# Patient Record
Sex: Male | Born: 1955 | Race: Black or African American | Hispanic: No | Marital: Married | Smoking: Former smoker
Health system: Southern US, Community
[De-identification: ages and names within clinical notes are randomized; demographics above are authoritative.]

## PROBLEM LIST (undated history)

## (undated) DIAGNOSIS — N182 Chronic kidney disease, stage 2 (mild): Secondary | ICD-10-CM

## (undated) DIAGNOSIS — D573 Sickle-cell trait: Secondary | ICD-10-CM

## (undated) DIAGNOSIS — N419 Inflammatory disease of prostate, unspecified: Secondary | ICD-10-CM

## (undated) DIAGNOSIS — E785 Hyperlipidemia, unspecified: Secondary | ICD-10-CM

## (undated) DIAGNOSIS — E119 Type 2 diabetes mellitus without complications: Principal | ICD-10-CM

## (undated) DIAGNOSIS — N400 Enlarged prostate without lower urinary tract symptoms: Secondary | ICD-10-CM

## (undated) DIAGNOSIS — D472 Monoclonal gammopathy: Secondary | ICD-10-CM

## (undated) HISTORY — DX: Chronic kidney disease, stage 2 (mild): N18.2

## (undated) HISTORY — DX: Benign prostatic hyperplasia without lower urinary tract symptoms: N40.0

## (undated) HISTORY — DX: Monoclonal gammopathy: D47.2

## (undated) HISTORY — DX: Hyperlipidemia, unspecified: E78.5

## (undated) HISTORY — DX: Inflammatory disease of prostate, unspecified: N41.9

## (undated) HISTORY — DX: Type 2 diabetes mellitus without complications: E11.9

---

## 2002-07-01 ENCOUNTER — Encounter: Payer: Self-pay | Admitting: Internal Medicine

## 2002-07-01 ENCOUNTER — Encounter: Admission: RE | Admit: 2002-07-01 | Discharge: 2002-07-01 | Payer: Self-pay | Admitting: Internal Medicine

## 2003-10-29 ENCOUNTER — Emergency Department (HOSPITAL_COMMUNITY): Admission: AD | Admit: 2003-10-29 | Discharge: 2003-10-29 | Payer: Self-pay | Admitting: Family Medicine

## 2003-12-10 DIAGNOSIS — E119 Type 2 diabetes mellitus without complications: Secondary | ICD-10-CM

## 2003-12-10 HISTORY — DX: Type 2 diabetes mellitus without complications: E11.9

## 2004-12-11 ENCOUNTER — Ambulatory Visit (HOSPITAL_COMMUNITY): Admission: RE | Admit: 2004-12-11 | Discharge: 2004-12-11 | Payer: Self-pay | Admitting: Gastroenterology

## 2006-11-20 ENCOUNTER — Emergency Department (HOSPITAL_COMMUNITY): Admission: EM | Admit: 2006-11-20 | Discharge: 2006-11-20 | Payer: Self-pay | Admitting: Emergency Medicine

## 2011-01-21 ENCOUNTER — Ambulatory Visit: Payer: Self-pay | Admitting: Family Medicine

## 2011-01-24 ENCOUNTER — Encounter: Payer: Self-pay | Admitting: Family Medicine

## 2011-01-24 ENCOUNTER — Ambulatory Visit (INDEPENDENT_AMBULATORY_CARE_PROVIDER_SITE_OTHER): Payer: BC Managed Care – PPO | Admitting: Family Medicine

## 2011-01-24 VITALS — BP 132/80 | Temp 98.1°F | Ht 72.0 in | Wt 163.0 lb

## 2011-01-24 DIAGNOSIS — N400 Enlarged prostate without lower urinary tract symptoms: Secondary | ICD-10-CM | POA: Insufficient documentation

## 2011-01-24 DIAGNOSIS — E1122 Type 2 diabetes mellitus with diabetic chronic kidney disease: Secondary | ICD-10-CM | POA: Insufficient documentation

## 2011-01-24 DIAGNOSIS — E785 Hyperlipidemia, unspecified: Secondary | ICD-10-CM

## 2011-01-24 DIAGNOSIS — E119 Type 2 diabetes mellitus without complications: Secondary | ICD-10-CM | POA: Insufficient documentation

## 2011-01-24 LAB — POCT GLYCOSYLATED HEMOGLOBIN (HGB A1C): Hemoglobin A1C: 7.9

## 2011-01-24 NOTE — Patient Instructions (Signed)
It was nice meeting you today.  I will let you know the results of your labs when they return and if we need to make any medication adjustments.  As far as insurance you can ask the receptionist up front for information about [facility] and how to get set up in that program.  I would like to see you back in one month once you are hopefully established with her.  At that time we will do a complete physical and check your prostate.  We will also follow up on your blood pressure as well to make sure it isn't creeping up.  In the meantime if you have any questions, please feel free to call our office. Have a nice day!

## 2011-01-25 ENCOUNTER — Telehealth: Payer: Self-pay | Admitting: Family Medicine

## 2011-01-25 DIAGNOSIS — E785 Hyperlipidemia, unspecified: Secondary | ICD-10-CM

## 2011-01-25 LAB — COMPREHENSIVE METABOLIC PANEL
ALT: 9 U/L (ref 0–53)
AST: 13 U/L (ref 0–37)
Albumin: 4.7 g/dL (ref 3.5–5.2)
Alkaline Phosphatase: 81 U/L (ref 39–117)
Potassium: 3.8 mEq/L (ref 3.5–5.3)
Sodium: 139 mEq/L (ref 135–145)
Total Bilirubin: 0.8 mg/dL (ref 0.3–1.2)
Total Protein: 7.8 g/dL (ref 6.0–8.3)

## 2011-01-25 LAB — LIPID PANEL
LDL Cholesterol: 220 mg/dL — ABNORMAL HIGH (ref 0–99)
VLDL: 23 mg/dL (ref 0–40)

## 2011-01-25 MED ORDER — SIMVASTATIN 40 MG PO TABS: 40.0000 mg | ORAL_TABLET | Freq: Every evening | ORAL | Status: DC

## 2011-01-25 NOTE — Telephone Encounter (Signed)
Called to give patient results of recent labs, patient unavailable left message with wife for him to call back when he gets an opportunity.

## 2011-01-25 NOTE — Telephone Encounter (Signed)
Patient returned call.  Lab results explained.  Counseled on lifestyle changes, will have him fill out a food record when he returns in one month.  For now will start simvastatin.

## 2011-01-28 ENCOUNTER — Encounter: Payer: Self-pay | Admitting: Family Medicine

## 2011-01-29 DIAGNOSIS — Z0000 Encounter for general adult medical examination without abnormal findings: Secondary | ICD-10-CM | POA: Insufficient documentation

## 2011-01-29 NOTE — Assessment & Plan Note (Signed)
Was on meds before, will obtain lipid panel if elevated enough to need meds,  will start zocor since generic and fairly inexpensive.

## 2011-01-29 NOTE — Progress Notes (Signed)
  Subjective:    Patient ID: Richard Gill, male    DOB: Nov 12, 1956, 55 y.o.   MRN: [id number]  HPI Pt. Is new patient here for annual physical and to discuss follow-up of diabetes, lipids and bph  1.  Diabetes-  Has had diabetes for ~7 years.  Currently on glimeperide and metformin.  Tolerating these medications well.  Does not check blood sugars at home as he has not been able to afford strips.  Currently does not have insurance and has been umemployed for the past few months.  Interested in getting set up with [facility] program.  When he was checking sugar he states that his sugars were in the one teens range.  Denies episodes of low blood sugar.    2.  Lipids Was on meds before, thinks it was lipitor, unsure of dosage.  States he was taken off because cholesterol improved.  Had been tried on other meds before, some made his muscles achy.  Denies any recent changes in diet or activity  3. BPH Was on tamsulosin, has been out for a few months.  States even though he has been out his stream is normal and he has not had any further symptoms of prostate problems.  Is still taking saw palmetto supplement.  Denies nocturia or hesitancy at this point.     Review of Systems Pertinent positives and negatives noted in HPI, Vitals signs noted     Objective:   Physical Exam  Constitutional: He is oriented to person, place, and time. He appears well-developed and well-nourished. No distress.  HENT:  Head: Normocephalic and atraumatic.  Right Ear: External ear normal.  Left Ear: External ear normal.  Nose: Nose normal.  Mouth/Throat: Oropharynx is clear and moist.  Eyes: Conjunctivae and EOM are normal. Pupils are equal, round, and reactive to light. No scleral icterus.  Neck: Neck supple. No tracheal deviation present. No thyromegaly present.  Cardiovascular: Normal rate and regular rhythm.  Exam reveals no gallop and no friction rub.   No murmur heard. Pulmonary/Chest: Effort normal and breath  sounds normal.  Abdominal: Soft. He exhibits no distension and no mass. There is no tenderness.  Musculoskeletal: He exhibits no edema.  Lymphadenopathy:    He has no cervical adenopathy.  Neurological: He is alert and oriented to person, place, and time. He has normal reflexes.  Skin: Skin is warm and dry.  Psychiatric: He has a normal mood and affect. His behavior is normal.

## 2011-01-29 NOTE — Assessment & Plan Note (Addendum)
Overall seems to be doing well, will get only basic labs today as to minimize cost since currently without insurance.

## 2011-01-29 NOTE — Assessment & Plan Note (Signed)
BPH seems improved, no longer needing medication.  Plans to cont. On saw palmetto.  Will see back in one month and do PSA once he is set up with [facility]

## 2011-01-29 NOTE — Assessment & Plan Note (Addendum)
Hard to assess control as pt. Has not been keeping track of sugars.  Will get hgb A1c to assess glycemic control.

## 2011-07-14 ENCOUNTER — Other Ambulatory Visit: Payer: Self-pay | Admitting: Family Medicine

## 2011-07-14 MED ORDER — GLIMEPIRIDE 4 MG PO TABS: 4.0000 mg | ORAL_TABLET | Freq: Every day | ORAL | Status: DC

## 2011-08-24 ENCOUNTER — Emergency Department (HOSPITAL_COMMUNITY)
Admission: EM | Admit: 2011-08-24 | Discharge: 2011-08-25 | Disposition: A | Discharge status: Home / Self Care | Payer: BC Managed Care – PPO | Attending: Emergency Medicine | Admitting: Emergency Medicine

## 2011-08-24 DIAGNOSIS — S5010XA Contusion of unspecified forearm, initial encounter: Secondary | ICD-10-CM | POA: Insufficient documentation

## 2011-08-24 DIAGNOSIS — W540XXA Bitten by dog, initial encounter: Secondary | ICD-10-CM | POA: Insufficient documentation

## 2011-08-24 DIAGNOSIS — Z79899 Other long term (current) drug therapy: Secondary | ICD-10-CM | POA: Insufficient documentation

## 2011-08-24 DIAGNOSIS — E119 Type 2 diabetes mellitus without complications: Secondary | ICD-10-CM | POA: Insufficient documentation

## 2011-09-06 ENCOUNTER — Ambulatory Visit (INDEPENDENT_AMBULATORY_CARE_PROVIDER_SITE_OTHER): Payer: BC Managed Care – PPO | Admitting: Family Medicine

## 2011-09-06 VITALS — BP 127/63 | HR 65 | Temp 98.5°F | Ht 69.6 in | Wt 151.0 lb

## 2011-09-06 DIAGNOSIS — E785 Hyperlipidemia, unspecified: Secondary | ICD-10-CM

## 2011-09-06 DIAGNOSIS — N400 Enlarged prostate without lower urinary tract symptoms: Secondary | ICD-10-CM

## 2011-09-06 DIAGNOSIS — W540XXA Bitten by dog, initial encounter: Secondary | ICD-10-CM

## 2011-09-06 DIAGNOSIS — S51809A Unspecified open wound of unspecified forearm, initial encounter: Secondary | ICD-10-CM

## 2011-09-06 DIAGNOSIS — E119 Type 2 diabetes mellitus without complications: Secondary | ICD-10-CM

## 2011-09-06 DIAGNOSIS — T148XXA Other injury of unspecified body region, initial encounter: Secondary | ICD-10-CM

## 2011-09-06 DIAGNOSIS — S51859A Open bite of unspecified forearm, initial encounter: Secondary | ICD-10-CM

## 2011-09-06 LAB — LIPID PANEL
HDL: 37 mg/dL — ABNORMAL LOW (ref >39)
LDL Cholesterol: 124 mg/dL — ABNORMAL HIGH (ref 0–99)

## 2011-09-06 LAB — POCT GLYCOSYLATED HEMOGLOBIN (HGB A1C): Hemoglobin A1C: 10.2

## 2011-09-06 LAB — C-PEPTIDE: C-Peptide: 0.74 ng/mL — ABNORMAL LOW (ref 0.80–3.90)

## 2011-09-06 LAB — INSULIN, RANDOM: Insulin: 4 u[IU]/mL (ref 3–28)

## 2011-09-06 MED ORDER — LISINOPRIL 2.5 MG PO TABS: 2.5000 mg | ORAL_TABLET | Freq: Every day | ORAL | Status: DC

## 2011-09-06 MED ORDER — TAMSULOSIN HCL 0.4 MG PO CAPS: 0.4000 mg | ORAL_CAPSULE | Freq: Every day | ORAL | Status: DC

## 2011-09-06 NOTE — Patient Instructions (Addendum)
It was good seeing you today.  I am concerned with your weight loss and your A1c increase that you are not making enough insulin.  I am going to check some labs to see how much insulin you are producing.  If your insulin is low we need to start supplementing you with insulin.  I am also checking your cholesterol and your PSA today.  I will let you know the results of these when they return.  I have started you on a medicine called lisinopril to protect your kidney.  You will need to come back in one week for a lab to make sure your kidney function is ok on the medicine.

## 2011-09-11 ENCOUNTER — Telehealth: Payer: Self-pay | Admitting: Family Medicine

## 2011-09-11 NOTE — Telephone Encounter (Signed)
Called patient to discuss lab results.  Explained to him that PSA was above 7 which is the cutoff for biopsy.  [facility] seen by [facility] in [facility] past for [facility]PH.  Will refer back to there for him to discuss prostate biopsy.  Also talked to him about diabetes, recent labs showed that insulin levels and c-peptide were low.  Asked him to make an appointment to discuss starting on insulin.

## 2011-09-11 NOTE — Telephone Encounter (Signed)
Forward to Dr Zigmund Daniel, patient with abnormal lab results

## 2011-09-11 NOTE — Telephone Encounter (Signed)
Mr. Makosky is calling to speak with Dr. Zigmund Daniel about the lab results and some medication.

## 2011-09-12 ENCOUNTER — Ambulatory Visit (INDEPENDENT_AMBULATORY_CARE_PROVIDER_SITE_OTHER): Payer: BC Managed Care – PPO | Admitting: Family Medicine

## 2011-09-12 ENCOUNTER — Encounter: Payer: Self-pay | Admitting: Family Medicine

## 2011-09-12 DIAGNOSIS — N400 Enlarged prostate without lower urinary tract symptoms: Secondary | ICD-10-CM

## 2011-09-12 DIAGNOSIS — E119 Type 2 diabetes mellitus without complications: Secondary | ICD-10-CM

## 2011-09-12 NOTE — Telephone Encounter (Signed)
Pt. In for visit today, will address today.

## 2011-09-13 ENCOUNTER — Other Ambulatory Visit: Payer: BC Managed Care – PPO

## 2011-09-13 DIAGNOSIS — E119 Type 2 diabetes mellitus without complications: Secondary | ICD-10-CM

## 2011-09-13 NOTE — Progress Notes (Signed)
Drew BMP per Dr. West Pugh last phone note to check kidney function. Unk Lightning, MLS

## 2011-09-14 LAB — BASIC METABOLIC PANEL WITH GFR
GFR, Est African American: >60 mL/min (ref >60)
GFR, Est Non African American: >60 mL/min (ref >60)
Potassium: 4.2 mEq/L (ref 3.5–5.3)
Sodium: 137 mEq/L (ref 135–145)

## 2011-09-15 DIAGNOSIS — W540XXA Bitten by dog, initial encounter: Secondary | ICD-10-CM | POA: Insufficient documentation

## 2011-09-15 DIAGNOSIS — S51859A Open bite of unspecified forearm, initial encounter: Secondary | ICD-10-CM | POA: Insufficient documentation

## 2011-09-15 NOTE — Assessment & Plan Note (Signed)
Healing well, given red flags.  TDAP updated.

## 2011-09-15 NOTE — Assessment & Plan Note (Signed)
Still having issues with prostate.  Concerning given undesired weight loss.   Declined DRE today.  Will check PSA today, if elevated will refer to urology to see if he needs repeat biopsy.  Will get him back on flomax for now for his urinary retention problem.

## 2011-09-15 NOTE — Assessment & Plan Note (Addendum)
A1C significantly elevated from previously.  I am concerned that he may be insulin deficient given his weight loss despite poor diet and his poor glycemic control.  I will check an insulin level and C-peptide today to assess the amount of insulin he is actually making.  If these are low he will need to be started on insulin.  Also currently not on ACE-I, will start low dose and recheck BMET in 1 week.

## 2011-09-15 NOTE — Progress Notes (Signed)
  Subjective:    Patient ID: Richard Gill., male    DOB: May 14, 1956, 55 y.o.   MRN: [id number]  HPI 1. Diabetes:  Here to follow up on his diabetes.  Feels like his sugars have been running high, did not bring meter today because he recently got a new meter and does not know how to use it, so he plans to return it for a different similar to the one he had previously.  He is taking his medications as prescribed.  He does get out and walk occasionally.  Regarding diet he does eat quite a bit of sweets and breads.   Despite his increased intake and poor dietary choices he reports a 7lb weight loss without trying.   2. Prostate issues:  Reports that a few days ago he felt like he was unable to make urine.  He felt like he had to go but was unable to.  He has had issues with his prostate in the past and has been followed by [facility].  Reports continued hesitancy but he is able to urinate now.  He does have a history of elevated PSA but he is unsure what his last one was because it has been 3-4 years since he has followed up because he has not had insurance.  Did have biopsy and was told he had BPH at that time.   Has been taking saw palmetto, but stopped because he could not tell much of a difference. Has tried flomax in the past which has worked well for him.  3.  Recent dog bite:  Recently had dog bite to R forearm after trying to break up his dogs from fighting.  Was seen in ED and area was flushed and cleaned.  He was given Augmentin.  Denies pain, redness, drainage from the area.  Given tetanus booster at that time as well.    Review of Systems Per HPI    Objective:   Physical Exam  Constitutional: He appears well-developed and well-nourished. No distress.  Neck: Neck supple. No thyromegaly present.  Cardiovascular: Normal rate, regular rhythm and normal heart sounds.   Pulmonary/Chest: Effort normal and breath sounds normal.  Genitourinary:       Patient did not want DRE today.     Musculoskeletal:       Well healed puncture wound on proximal r forearm.  Non erythematous, non-tender.          Assessment & Plan:

## 2011-09-15 NOTE — Assessment & Plan Note (Signed)
PSA elevated to threshold for biopsy.  I am unsure if this is stable from his previous PSA.  He may need to have repeat biopsy.  Will refer to [facility] for further evaluation.

## 2011-09-15 NOTE — Progress Notes (Signed)
  Subjective:    Patient ID: Richard Gill., male    DOB: [birth year], 55 y.o.   MRN: [id number]  HPIHere to discuss recent labs: 1.  DM:  Was recently in for follow up of his diabetes and reported that he has been losing weight despite increased sweets in his diet and compliance with his medications.  Concerning at that time for insulin deficiency.  We checked his insulin and c-peptide levels which were both low.  The sugars he has been getting on his meter the past couple of days are 260-435.  He typically checks fasting in the am and when he gets home from work.   2.  Prostate:  History of elevated PSA with normal biopsy in the past.  Has not followed up with [facility] in 3-4 years.  PSA elevated on recent check.  Urinary retention symptoms improved since starting back on flomax.    Review of Systems     Objective:   Physical Exam  Constitutional: He appears well-developed and well-nourished. No distress.          Assessment & Plan:

## 2011-09-15 NOTE — Assessment & Plan Note (Signed)
Explained to him that given results of recent lab work that he needs to be started on insulin.  Patient nervous about starting on insulin as he is afraid of needles.  Seen with pharmacy resident during this visit and we explained to him use of insulin pen and size of insulin needles.  He did feel a bit more comfortable after that and said that he would help his daughter help with this initially.  I will have him f/u with pharmacy clinic to get discuss insulin and get insulin use teaching.  Scheduled for 10/8.

## 2011-09-16 ENCOUNTER — Telehealth: Payer: Self-pay | Admitting: Family Medicine

## 2011-09-16 ENCOUNTER — Encounter: Payer: Self-pay | Admitting: Pharmacist

## 2011-09-16 ENCOUNTER — Ambulatory Visit (INDEPENDENT_AMBULATORY_CARE_PROVIDER_SITE_OTHER): Payer: BC Managed Care – PPO | Admitting: Pharmacist

## 2011-09-16 VITALS — BP 111/68 | HR 105 | Ht 72.5 in | Wt 152.7 lb

## 2011-09-16 DIAGNOSIS — E785 Hyperlipidemia, unspecified: Secondary | ICD-10-CM

## 2011-09-16 DIAGNOSIS — E119 Type 2 diabetes mellitus without complications: Secondary | ICD-10-CM

## 2011-09-16 MED ORDER — INSULIN GLARGINE 100 UNIT/ML ~~LOC~~ SOLN: 10.0000 [IU] | Freq: Every day | SUBCUTANEOUS | Status: DC

## 2011-09-16 MED ORDER — INSULIN PEN NEEDLE 31G X 5 MM MISC: 1.0000 | Freq: Every day | Status: DC

## 2011-09-16 NOTE — Assessment & Plan Note (Signed)
Pt's LDL-C remains above goal (<100) at 120 on simvastatin 40mg .  Pt is working on reducing his carbohydrate intake however, and we will check his cholesterol panel again in 3 months.

## 2011-09-16 NOTE — Telephone Encounter (Signed)
Called by wife b/c pharmacy said they didn't receive Lantus order.  In EPIC, it states Lantus was sent in.  Called Pharmacy to clarify, pharmacist agreed that he had not received the order. Gave exact same prescription today that was prescribed in clinic over the phone.

## 2011-09-16 NOTE — Progress Notes (Signed)
  Subjective:    Patient ID: Richard Augusta., male    DOB: 01-Jun-1956, 55 y.o.   MRN: [id number]  HPI  Reviewed and agree with Dr. Graylin Shiver management.   Review of Systems     Objective:   Physical Exam        Assessment & Plan:

## 2011-09-16 NOTE — Progress Notes (Signed)
  Subjective:    Patient ID: Richard Augusta., male    DOB: 02-Jun-1956, 55 y.o.   MRN: [id number]  HPI 71 yoM presenting for insulin initiation and counseling.   Pt has been taking metformin 1g BID, and his most recent A1C is 10.2. Pt denies taking amaryl since 01/2011.  , and pt states his blood sugar has been consistently 300-500s.  He was seen by Dr. Zigmund Daniel last week who decided insulin therapy is now appropriate.       Review of Systems     Objective:   Physical Exam Recent insulin level of 4 and c-peptide level of 0.79.  LDL has decreased from 220--> 124 with initation of simvastatin 40mg .        Assessment & Plan:   Diabetes of unknown duration currently under poor control of blood glucose based on  . Lab Results  Component Value Date   HGBA1C 10.2 09/06/2011   , and home CBG readings of 300-500s. Denies hypoglycemic events.  Able to verbalize appropriate hypoglycemia management plan. Started basal insulin Lantus (insulin glargine) 10 units every morning. Patient will continue to titrate 1 unit/day if fasting CBGs > 100mg /dl until fasting CBGs reach goal or next visit. Pt also stated he  is trying to reduce his carbohydrate intake, especially with  Sugar beverages and krispy kreme donuts.  Pt will not restart amaryl at this time.   Pt's LDL-C remains above goal (<100) at 120 on simvastatin 40mg .  Pt is working on reducing his carbohydrate intake however, and we will check his cholesterol panel again in 3 months.   Written patient instructions provided.  Follow up in  Pharmacist Clinic Visit 1 month.   TTFFC 30 minutes.  Patient seen with .Volanda Napoleon, PharmD Candidate and Ralene Bathe, Pharmacy Resident .

## 2011-09-16 NOTE — Patient Instructions (Signed)
Start Lantus insulin injection 10 units tomorrow morning. Go up by 1 unit each day until your sugar is around 100 and keep taking that dose. Follow up in 1 month with Rx Clinic.

## 2011-09-16 NOTE — Assessment & Plan Note (Signed)
Diabetes of unknown duration currently under poor control of blood glucose based on  . Lab Results  Component Value Date   HGBA1C 10.2 09/06/2011   , and home CBG readings of 300-500s. Denies hypoglycemic events.  Able to verbalize appropriate hypoglycemia management plan. Started basal insulin Lantus (insulin glargine) 10 units every morning. Patient will continue to titrate 1 unit/day if fasting CBGs > 100mg /dl until fasting CBGs reach goal or next visit. Pt also stated he  is trying to reduce his carbohydrate intake, especially with  Sugar beverages and krispy kreme donuts.  Pt will not restart amaryl at this time.   Written patient instructions provided.  Follow up in  Pharmacist Clinic Visit 1 month.   TTFFC 30 minutes.  Patient seen with .Volanda Napoleon, PharmD Candidate and Ralene Bathe, Pharmacy Resident .

## 2011-09-27 ENCOUNTER — Telehealth: Payer: Self-pay | Admitting: Family Medicine

## 2011-09-27 NOTE — Telephone Encounter (Signed)
Pt is calling to say he is having problems with his vision and thinks it is coming from the new medicine he was placed on.  Wants to talk to Dr Rodena Piety about this today if possible.

## 2011-09-27 NOTE — Telephone Encounter (Signed)
Patient started on Lantus 10/8.  Prior to insulin he was taking Metformin with BS's running on the 300 - 500 range.  Instructions for insulin were to administer 10 unit daily and increase by 1 unit until BS was at 100.   Last night's BS was 67 and 72 this am with an am dose of 16 units.  He is now having blurred vision.  Consulted with Dr. Jess Barters who recommended that he drop back to 10 units tomorrow and over the weekend.  Should continue to monitor BSs over the w/e.  Instructed wife what to do in the event of a hyglycemic episode as well as how to reach the on-call resident over the weekend.  Told her I would call her back on Monday to see how BSs were doing.  Wife agreable.

## 2011-09-30 ENCOUNTER — Ambulatory Visit: Payer: BC Managed Care – PPO | Admitting: Family Medicine

## 2011-09-30 NOTE — Telephone Encounter (Signed)
Spike with patient just now and he did continue with the 10 units of Lantus over the weekend.  Did not have his meter with him but reported that his BS's were in the 100 to 120 range.  Told him I would consult with Dr. Zigmund Daniel or Dr. Valentina Lucks to determine if he should stay on the 10 units or increase it.  I will call him back.

## 2011-09-30 NOTE — Telephone Encounter (Signed)
Spoke with patient regarding his insulin. Has been using 10 units of Lantus over the weekend. Reports sugars fasting in the 120s. He had one episode in the morning where sugar was in the 180s. Sugar was dropping into the 60s and 70s on 16 units last week, he was having some blurred vision with this. He denies any shakiness or sweats. Explained him that we would increase his Lantus slowly again. Advised him that if his blood was over 115 fasting  add an additional unit of Lantus in the morning. He will call back is still dropping too low.

## 2012-02-07 ENCOUNTER — Other Ambulatory Visit: Payer: Self-pay | Admitting: Family Medicine

## 2012-02-07 NOTE — Telephone Encounter (Signed)
Refill request

## 2012-04-08 DIAGNOSIS — D472 Monoclonal gammopathy: Secondary | ICD-10-CM

## 2012-04-08 HISTORY — DX: Monoclonal gammopathy: D47.2

## 2012-05-06 ENCOUNTER — Other Ambulatory Visit: Payer: Self-pay | Admitting: *Deleted

## 2012-05-06 DIAGNOSIS — N400 Enlarged prostate without lower urinary tract symptoms: Secondary | ICD-10-CM

## 2012-05-06 MED ORDER — TAMSULOSIN HCL 0.4 MG PO CAPS: 0.4000 mg | ORAL_CAPSULE | Freq: Every day | ORAL | Status: DC

## 2012-05-07 ENCOUNTER — Other Ambulatory Visit: Payer: Self-pay | Admitting: *Deleted

## 2012-05-07 DIAGNOSIS — E119 Type 2 diabetes mellitus without complications: Secondary | ICD-10-CM

## 2012-05-07 MED ORDER — LISINOPRIL 2.5 MG PO TABS: 2.5000 mg | ORAL_TABLET | Freq: Every day | ORAL | Status: DC

## 2012-09-18 ENCOUNTER — Other Ambulatory Visit: Payer: Self-pay | Admitting: Family Medicine

## 2012-10-12 ENCOUNTER — Emergency Department (HOSPITAL_COMMUNITY)
Admission: EM | Admit: 2012-10-12 | Discharge: 2012-10-12 | Disposition: A | Discharge status: Home / Self Care | Payer: BC Managed Care – PPO | Attending: Emergency Medicine | Admitting: Emergency Medicine

## 2012-10-12 ENCOUNTER — Encounter (HOSPITAL_COMMUNITY): Payer: Self-pay

## 2012-10-12 DIAGNOSIS — Z7982 Long term (current) use of aspirin: Secondary | ICD-10-CM | POA: Insufficient documentation

## 2012-10-12 DIAGNOSIS — R110 Nausea: Secondary | ICD-10-CM | POA: Insufficient documentation

## 2012-10-12 DIAGNOSIS — N400 Enlarged prostate without lower urinary tract symptoms: Secondary | ICD-10-CM | POA: Insufficient documentation

## 2012-10-12 DIAGNOSIS — N10 Acute tubulo-interstitial nephritis: Secondary | ICD-10-CM | POA: Insufficient documentation

## 2012-10-12 DIAGNOSIS — E785 Hyperlipidemia, unspecified: Secondary | ICD-10-CM | POA: Insufficient documentation

## 2012-10-12 DIAGNOSIS — R509 Fever, unspecified: Secondary | ICD-10-CM | POA: Insufficient documentation

## 2012-10-12 DIAGNOSIS — E119 Type 2 diabetes mellitus without complications: Secondary | ICD-10-CM | POA: Insufficient documentation

## 2012-10-12 DIAGNOSIS — R300 Dysuria: Secondary | ICD-10-CM | POA: Insufficient documentation

## 2012-10-12 DIAGNOSIS — Z794 Long term (current) use of insulin: Secondary | ICD-10-CM | POA: Insufficient documentation

## 2012-10-12 DIAGNOSIS — Z79899 Other long term (current) drug therapy: Secondary | ICD-10-CM | POA: Insufficient documentation

## 2012-10-12 DIAGNOSIS — Z87891 Personal history of nicotine dependence: Secondary | ICD-10-CM | POA: Insufficient documentation

## 2012-10-12 LAB — URINALYSIS, ROUTINE W REFLEX MICROSCOPIC
Bilirubin Urine: NEGATIVE
Ketones, ur: 15 mg/dL — AB
Nitrite: NEGATIVE
Protein, ur: 30 mg/dL — AB
Urobilinogen, UA: 0.2 mg/dL (ref 0.0–1.0)

## 2012-10-12 LAB — CBC WITH DIFFERENTIAL/PLATELET
Basophils Relative: 0 % (ref 0–1)
Eosinophils Absolute: 0.1 10*3/uL (ref 0.0–0.7)
Eosinophils Relative: 1 % (ref 0–5)
Hemoglobin: 11.7 g/dL — ABNORMAL LOW (ref 13.0–17.0)
MCH: 30.6 pg (ref 26.0–34.0)
MCHC: 35.5 g/dL (ref 30.0–36.0)
Monocytes Relative: 9 % (ref 3–12)
Neutrophils Relative %: 82 % — ABNORMAL HIGH (ref 43–77)

## 2012-10-12 LAB — BASIC METABOLIC PANEL
BUN: 15 mg/dL (ref 6–23)
Calcium: 9.1 mg/dL (ref 8.4–10.5)
Creatinine, Ser: 1.43 mg/dL — ABNORMAL HIGH (ref 0.50–1.35)
GFR calc Af Amer: 61 mL/min — ABNORMAL LOW (ref >90)
GFR calc non Af Amer: 53 mL/min — ABNORMAL LOW (ref >90)
Potassium: 3.9 mEq/L (ref 3.5–5.1)

## 2012-10-12 MED ORDER — SULFAMETHOXAZOLE-TRIMETHOPRIM 800-160 MG PO TABS: 1.0000 | ORAL_TABLET | Freq: Two times a day (BID) | ORAL | Status: DC

## 2012-10-12 MED ORDER — SULFAMETHOXAZOLE-TMP DS 800-160 MG PO TABS
1.0000 | ORAL_TABLET | Freq: Once | ORAL | Status: AC
  Administered 2012-10-12: 1 via ORAL
  Filled 2012-10-12: qty 1

## 2012-10-12 MED ORDER — SODIUM CHLORIDE 0.9 % IV BOLUS (SEPSIS)
1000.0000 mL | Freq: Once | INTRAVENOUS | Status: AC
  Administered 2012-10-12: 1000 mL via INTRAVENOUS

## 2012-10-12 MED ORDER — OXYCODONE-ACETAMINOPHEN 5-325 MG PO TABS: ORAL_TABLET | ORAL | Status: DC

## 2012-10-12 MED ORDER — ONDANSETRON HCL 4 MG/2ML IJ SOLN
4.0000 mg | Freq: Once | INTRAMUSCULAR | Status: AC
  Administered 2012-10-12: 4 mg via INTRAVENOUS

## 2012-10-12 MED ORDER — ONDANSETRON HCL 4 MG/2ML IJ SOLN
4.0000 mg | Freq: Once | INTRAMUSCULAR | Status: AC
  Administered 2012-10-12: 4 mg via INTRAVENOUS
  Filled 2012-10-12: qty 2

## 2012-10-12 MED ORDER — ONDANSETRON HCL 4 MG/2ML IJ SOLN
INTRAMUSCULAR | Status: AC
  Filled 2012-10-12: qty 2

## 2012-10-12 MED ORDER — ONDANSETRON HCL 4 MG PO TABS: 4.0000 mg | ORAL_TABLET | Freq: Three times a day (TID) | ORAL | Status: DC | PRN

## 2012-10-12 MED ORDER — MORPHINE SULFATE 4 MG/ML IJ SOLN
4.0000 mg | Freq: Once | INTRAMUSCULAR | Status: AC
  Administered 2012-10-12: 4 mg via INTRAVENOUS
  Filled 2012-10-12: qty 1

## 2012-10-12 NOTE — ED Notes (Signed)
Nicole, PA at bedside 

## 2012-10-12 NOTE — ED Notes (Addendum)
Pt A.O. X 4. Complains of left upper quadrant x 4 days.  Tender on palpation. Non radiating. Worse with activity.  Reports nausea. Denies vomiting. Denies constipation and diarrhea. Denies recent change in diet or medication. Reports hxt of DM.  Insulin dependant. Respirations even and regular. Vitals stable. Ambulatory. NAD. Family at bedside.

## 2012-10-12 NOTE — ED Provider Notes (Signed)
History     CSN: [id number]  Arrival date & time 10/12/12  [id number]   None     Chief Complaint  Patient presents with  . Abdominal Pain    (Consider location/radiation/quality/duration/timing/severity/associated sxs/prior treatment) Patient is a 56 y.o. male presenting with abdominal pain.  Abdominal Pain The primary symptoms of the illness include abdominal pain, fever, nausea and dysuria. The primary symptoms of the illness do not include shortness of breath, vomiting or diarrhea.  The dysuria is not associated with hematuria.  Additional symptoms associated with the illness include chills. Symptoms associated with the illness do not include hematuria.   Richard Gill. is a 56 y.o. male with PMH significant for IDDM, hyperlipemia and BPH complaining of dysuria, nausea, fever (Tmax 102.6) and chills, and 6/10 Left flank/LUQ pain.  Symptoms evolved in the order listed above over 4 days. Pt denies change in bowel or bladder habits, emesis, testicular pain/swelling, rash, urethral discharge, chest pain, SOB.    Past Medical History  Diagnosis Date  . T2DM (type 2 diabetes mellitus) 2005  . Hyperlipidemia   . BPH (benign prostatic hyperplasia)     Past Surgical History  Procedure Date  . Colonoscopy 2006    Dr. Benson Norway, was told to f/u in 5 years    Family History  Problem Relation Age of Onset  . Heart attack Father 70  . Colon cancer Sister 24  . Colon cancer Sister 67  . Breast cancer Mother 51    History  Substance Use Topics  . Smoking status: Former Smoker    Quit date: 01/28/1991  . Smokeless tobacco: Not on file  . Alcohol Use: No      Review of Systems  Constitutional: Positive for fever and chills.  Respiratory: Negative for shortness of breath.   Cardiovascular: Negative for chest pain.  Gastrointestinal: Positive for nausea and abdominal pain. Negative for vomiting and diarrhea.  Genitourinary: Positive for dysuria and flank pain. Negative for hematuria,  discharge, scrotal swelling and testicular pain.  All other systems reviewed and are negative.    Allergies  Review of patient's allergies indicates no known allergies.  Home Medications   Current Outpatient Rx  Name  Route  Sig  Dispense  Refill  . ASPIRIN 325 MG PO TABS   Oral   Take 325 mg by mouth daily.           . INSULIN GLARGINE 100 UNIT/ML [place] SOLN   Subcutaneous   Inject 10-40 Units into the skin at bedtime. Inject 10 units today and increase dose by 1 unit each day until morning sugar is around 100. Once your blood sugar is at 100 continue taking that dose.   10 mL   12   . INSULIN PEN NEEDLE 31G X 5 MM MISC   Subcutaneous   Inject 1 Device into the skin daily. Quantity sufficient for 1 injection a day   1 each   11   . LISINOPRIL 2.5 MG PO TABS      TAKE ONE TABLET BY MOUTH EVERY DAY. NEEDS APPOINTMENT FOR ADDITIONAL REFILLS   15 tablet   0   . METFORMIN HCL 1000 MG PO TABS   Oral   Take 1,000 mg by mouth 2 (two) times daily with meals.           Marland Kitchen SIMVASTATIN 40 MG PO TABS      TAKE ONE TABLET BY MOUTH EVERY DAY IN THE EVENING   30 tablet  6   . TAMSULOSIN HCL 0.4 MG PO CAPS   Oral   Take 1 capsule (0.4 mg total) by mouth daily.   30 capsule   6     BP 129/82  Pulse 88  Temp 98.6 F (37 C) (Oral)  Resp 18  SpO2 99%  Physical Exam  Nursing note and vitals reviewed. Constitutional: He is oriented to person, place, and time. He appears well-developed and well-nourished. No distress.  HENT:  Head: Normocephalic.  Mouth/Throat: Oropharynx is clear and moist.  Eyes: Conjunctivae normal and EOM are normal.  Cardiovascular: Normal rate, regular rhythm and normal heart sounds.   Pulmonary/Chest: Effort normal. No stridor.  Abdominal: Soft. Bowel sounds are normal. He exhibits no distension and no mass. There is no tenderness. There is no rebound and no guarding.  Genitourinary:       Mild to moderate left CVA tenderness  Musculoskeletal:  Normal range of motion.  Neurological: He is alert and oriented to person, place, and time.  Skin: Skin is warm.  Psychiatric: He has a normal mood and affect.    ED Course  Procedures (including critical care time)  Labs Reviewed  CBC WITH DIFFERENTIAL - Abnormal; Notable for the following:    RBC 3.82 (*)     Hemoglobin 11.7 (*)     HCT 33.0 (*)     Platelets 144 (*)     Neutrophils Relative 82 (*)     Neutro Abs 7.8 (*)     Lymphocytes Relative 9 (*)     All other components within normal limits  BASIC METABOLIC PANEL - Abnormal; Notable for the following:    Glucose, Bld 206 (*)     Creatinine, Ser 1.43 (*)     GFR calc non Af Amer 53 (*)     GFR calc Af Amer 61 (*)     All other components within normal limits  URINALYSIS, ROUTINE W REFLEX MICROSCOPIC - Abnormal; Notable for the following:    APPearance CLOUDY (*)     Glucose, UA 250 (*)     Hgb urine dipstick LARGE (*)     Ketones, ur 15 (*)     Protein, ur 30 (*)     Leukocytes, UA MODERATE (*)     All other components within normal limits  URINE MICROSCOPIC-ADD ON - Abnormal; Notable for the following:    Squamous Epithelial / LPF FEW (*)     Bacteria, UA FEW (*)     All other components within normal limits  URINE CULTURE   No results found.   1. Pyelonephritis, acute       MDM  Patient with dysuria, fever, nausea and left CVA tenderness. Likely pyelo.   Urinalysis shows few bacteria with moderate leukocytes X. esterase and large hemoglobin. I will culture the urine he will receive the first dose of Bactrim and I will write him twice a day X7. Antibiotic choice discussed with attending Dr. Prince Rome. Urine culture ordered.   Slight bump in creatinine to 1.43  Discussed results and plan with patient and wife who agree with care plan. Return precautions given.  New Prescriptions   ONDANSETRON (ZOFRAN) 4 MG TABLET    Take 1 tablet (4 mg total) by mouth every 8 (eight) hours as needed for nausea.    OXYCODONE-ACETAMINOPHEN (PERCOCET/ROXICET) 5-325 MG PER TABLET    1 to 2 tabs PO q6hrs  PRN for pain   SULFAMETHOXAZOLE-TRIMETHOPRIM (SEPTRA DS) 800-160 MG PER TABLET  Take 1 tablet by mouth every 12 (twelve) hours.          Monico Blitz, PA-C 10/12/12 [id number]

## 2012-10-13 NOTE — ED Provider Notes (Signed)
Medical screening examination/treatment/procedure(s) were performed by non-physician practitioner and as supervising physician I was immediately available for consultation/collaboration.  Perlie Mayo, MD 10/13/12 [phone]

## 2012-10-14 LAB — URINE CULTURE

## 2012-10-15 NOTE — ED Notes (Addendum)
+   Urine Patient treated with Bactrim-sensitive to same-chart appended per protocol MD.

## 2012-10-16 ENCOUNTER — Encounter: Payer: Self-pay | Admitting: Family Medicine

## 2012-10-16 ENCOUNTER — Ambulatory Visit (INDEPENDENT_AMBULATORY_CARE_PROVIDER_SITE_OTHER): Payer: BC Managed Care – PPO | Admitting: Family Medicine

## 2012-10-16 VITALS — BP 132/72 | HR 69 | Ht 70.5 in | Wt 153.0 lb

## 2012-10-16 DIAGNOSIS — R799 Abnormal finding of blood chemistry, unspecified: Secondary | ICD-10-CM

## 2012-10-16 DIAGNOSIS — R7989 Other specified abnormal findings of blood chemistry: Secondary | ICD-10-CM

## 2012-10-16 DIAGNOSIS — N3090 Cystitis, unspecified without hematuria: Secondary | ICD-10-CM

## 2012-10-16 NOTE — Progress Notes (Signed)
Richard Gill. is a 56 y.o. who presents today for hospital f/u for cystitis.  Pt recently seen in the ED on 10/12/12 for increased urinary frequency, urgency, fever, and CVA pain.  He was dx with pyelonephritis and was started on Bactrim bid for 7 days.    Today, pt is not c/o fever, chills, suprapubic pain, increased urgency or frequency, dysuria, hematuria.  On day 5/7 of ABx.   Past Medical History  Diagnosis Date  . T2DM (type 2 diabetes mellitus) 2005  . Hyperlipidemia   . BPH (benign prostatic hyperplasia)     Family History  Problem Relation Age of Onset  . Heart attack Father 7  . Colon cancer Sister 70  . Colon cancer Sister 15  . Breast cancer Mother 25    Current Outpatient Prescriptions on File Prior to Visit  Medication Sig Dispense Refill  . aspirin 325 MG tablet Take 325 mg by mouth daily.      Marland Kitchen CINNAMON PO Take 1 tablet by mouth daily.      . insulin glargine (LANTUS SOLOSTAR) 100 UNIT/ML injection Inject 10 Units into the skin every morning.      Marland Kitchen lisinopril (PRINIVIL,ZESTRIL) 2.5 MG tablet Take 2.5 mg by mouth daily.      . metFORMIN (GLUCOPHAGE) 1000 MG tablet Take 1,000 mg by mouth 2 (two) times daily with meals.        . ondansetron (ZOFRAN) 4 MG tablet Take 1 tablet (4 mg total) by mouth every 8 (eight) hours as needed for nausea.  10 tablet  0  . oxyCODONE-acetaminophen (PERCOCET/ROXICET) 5-325 MG per tablet 1 to 2 tabs PO q6hrs  PRN for pain  15 tablet  0  . simvastatin (ZOCOR) 40 MG tablet Take 40 mg by mouth every evening.      . sulfamethoxazole-trimethoprim (SEPTRA DS) 800-160 MG per tablet Take 1 tablet by mouth every 12 (twelve) hours.  14 tablet  0  . Tamsulosin HCl (FLOMAX) 0.4 MG CAPS Take 0.4 mg by mouth daily.      . Thiamine HCl (VITAMIN B-1 PO) Take 1 tablet by mouth daily.         Physical Exam Filed Vitals:   10/16/12 1510  BP: 132/72  Pulse: 69    Gen: NAD Neck: no JVD Cardio: RRR Lungs: CTA Abd: NABS, soft nontender non  distended, no suprapubic tenderness, no CVA tenderness.       Chemistry      Component Value Date/Time   NA 136 10/12/2012 0708   K 3.9 10/12/2012 0708   CL 102 10/12/2012 0708   CO2 23 10/12/2012 0708   BUN 15 10/12/2012 0708   CREATININE 1.43* 10/12/2012 0708   CREATININE 1.13 09/13/2011 0907      Component Value Date/Time   CALCIUM 9.1 10/12/2012 0708   ALKPHOS 81 01/24/2011 1658   AST 13 01/24/2011 1658   ALT 9 01/24/2011 1658   BILITOT 0.8 01/24/2011 1658      Lab Results  Component Value Date   WBC 9.5 10/12/2012   HGB 11.7* 10/12/2012   HCT 33.0* 10/12/2012   MCV 86.4 10/12/2012   PLT 144* 10/12/2012

## 2012-10-16 NOTE — Patient Instructions (Signed)
Richard Gill, it was nice to see you in clinic today.  I'm glad you are doing well. Continue to take the Bactrim as directed for a total of 7 days.  Please see Dr. Zigmund Daniel in the next two weeks for a well check and medication refill.  Thanks, Dr. Awanda Mink

## 2012-10-16 NOTE — Assessment & Plan Note (Signed)
Elevated Creatinine to 1.43 in hospital ED on 10/12/12.  Would consider repeating at well check with Dr. Zigmund Daniel in two weeks.

## 2012-10-16 NOTE — Assessment & Plan Note (Signed)
On day 5/7 of Bactrim for cystitis.  Considered to have pyelonephritis in the ED.  However, no documented fever in the past two weeks and WBC in ED was 9.5.  Pt is all right with taking 7 days for the infection.  Will see Dr. Zigmund Daniel in next two weeks for well check.

## 2012-10-20 ENCOUNTER — Telehealth: Payer: Self-pay | Admitting: Family Medicine

## 2012-10-20 MED ORDER — SULFAMETHOXAZOLE-TRIMETHOPRIM 800-160 MG PO TABS: 1.0000 | ORAL_TABLET | Freq: Two times a day (BID) | ORAL | Status: DC

## 2012-10-20 NOTE — Telephone Encounter (Signed)
In regards to previous office visit.

## 2012-10-20 NOTE — Telephone Encounter (Addendum)
partient came in and saw Dr. Awanda Mink on 11/08. Wife states he still has chills. Temp last night was 99.  Having pain lower back and in rectal area.  Finished antibiotic yesterday. Will forward to Dr. Awanda Mink.

## 2012-10-20 NOTE — Telephone Encounter (Signed)
Spoke with patient's wife regarding patient's Sx.  Concern for pyelonephritis originally in the ED.  However, did not have WBC or fever at anytime during his course.  Wife feels he is not improving and continuing to have chills.  Will go ahead and call in one more week of Bactrim to complete the Tx course for pyelo.  If no improvement by Friday, recommended to call back for an appointment that day.  Also, for the pain recommended tylenol 650 mg tid.  No Ibuprofen due to his elevation in [place].    Tamela Oddi Awanda Mink, DO of [facility] 10/20/2012, 3:31 PM

## 2012-10-20 NOTE — Telephone Encounter (Signed)
Wife is calling about taking her husband, she took him to the ER last week for pain in his abdomen and chills.  He was diagnosed with pyelonephritis, then he came in to [facility] and saw a different doctor.  Patient isn't feeling any better, all of the meds he was prescribed are gone and now he is having pain in his rectum.  She is particularly concerned because he is a diabetic.

## 2012-10-21 ENCOUNTER — Other Ambulatory Visit: Payer: Self-pay | Admitting: Family Medicine

## 2012-10-30 ENCOUNTER — Ambulatory Visit (INDEPENDENT_AMBULATORY_CARE_PROVIDER_SITE_OTHER): Payer: BC Managed Care – PPO | Admitting: Family Medicine

## 2012-10-30 ENCOUNTER — Encounter: Payer: Self-pay | Admitting: Family Medicine

## 2012-10-30 VITALS — BP 152/87 | HR 67 | Temp 97.9°F | Ht 70.5 in | Wt 155.0 lb

## 2012-10-30 DIAGNOSIS — E785 Hyperlipidemia, unspecified: Secondary | ICD-10-CM

## 2012-10-30 DIAGNOSIS — I10 Essential (primary) hypertension: Secondary | ICD-10-CM

## 2012-10-30 DIAGNOSIS — E119 Type 2 diabetes mellitus without complications: Secondary | ICD-10-CM

## 2012-10-30 LAB — LIPID PANEL
LDL Cholesterol: 118 mg/dL — ABNORMAL HIGH (ref 0–99)
Total CHOL/HDL Ratio: 6.5 Ratio
VLDL: 24 mg/dL (ref 0–40)

## 2012-10-30 MED ORDER — INSULIN GLARGINE 100 UNIT/ML ~~LOC~~ SOLN: 12.0000 [IU] | Freq: Every morning | SUBCUTANEOUS | Status: DC

## 2012-10-30 MED ORDER — TAMSULOSIN HCL 0.4 MG PO CAPS: 0.4000 mg | ORAL_CAPSULE | Freq: Every day | ORAL | Status: DC

## 2012-10-30 MED ORDER — SIMVASTATIN 40 MG PO TABS: 40.0000 mg | ORAL_TABLET | Freq: Every evening | ORAL | Status: DC

## 2012-10-30 MED ORDER — METFORMIN HCL 1000 MG PO TABS: 1000.0000 mg | ORAL_TABLET | Freq: Two times a day (BID) | ORAL | Status: DC

## 2012-10-30 MED ORDER — LISINOPRIL 2.5 MG PO TABS: 2.5000 mg | ORAL_TABLET | Freq: Every day | ORAL | Status: DC

## 2012-10-30 NOTE — Patient Instructions (Addendum)
Thank you for coming in today, it was good to see you Your A1c looks pretty good today.  Continue lantus at 12 units Return in three months or as needed.

## 2012-11-03 DIAGNOSIS — I10 Essential (primary) hypertension: Secondary | ICD-10-CM | POA: Insufficient documentation

## 2012-11-03 NOTE — Assessment & Plan Note (Signed)
BP elevated today.  Restart lisinopril

## 2012-11-03 NOTE — Progress Notes (Signed)
  Subjective:    Patient ID: Richard Augusta., Richard Gill    DOB: 06-Jul-1956, 56 y.o.   MRN: [id number]  HPI 1. DM:  CHRONIC DIABETES  Disease Monitoring  Blood Sugar Ranges: 100-150's fasting  Polyuria: no   Visual problems: no   Medication Compliance: yes.  Using 12 units of lantus and metformin.  Medication Side Effects  Hypoglycemia: no   Preventitive Health Care  Eye Exam: Needed  Foot Exam: Done Today  Diet pattern: Generally healthy, tries to limit carb intake.  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   Does not do a whole lot in addition.    2. HTN: BP elevated today.  Has been out of lisinopril for a couple of weeks.  Does not monitor BP at home.  He denies chest pain, palpitations, vision changes, weakness, headache.   Review of Systems Per HPI    Objective:   Physical Exam  Constitutional: He appears well-nourished. No distress.  HENT:  Head: Normocephalic and atraumatic.  Cardiovascular: Normal rate and regular rhythm.   Musculoskeletal: He exhibits no edema.  Neurological: He is alert.          Assessment & Plan:

## 2012-11-03 NOTE — Assessment & Plan Note (Signed)
A1c improved from previous, continue current medications.  Continue to work on diet and exercise.  Return in 3 months.   Foot exam done today.

## 2012-12-09 HISTORY — PX: COLONOSCOPY: SHX174

## 2013-01-22 ENCOUNTER — Ambulatory Visit: Payer: BC Managed Care – PPO | Admitting: Family Medicine

## 2013-02-04 ENCOUNTER — Encounter: Payer: Self-pay | Admitting: Family Medicine

## 2013-02-04 ENCOUNTER — Ambulatory Visit (INDEPENDENT_AMBULATORY_CARE_PROVIDER_SITE_OTHER): Payer: BC Managed Care – PPO | Admitting: Family Medicine

## 2013-02-04 ENCOUNTER — Other Ambulatory Visit: Payer: Self-pay | Admitting: Family Medicine

## 2013-02-04 VITALS — BP 115/63 | HR 94 | Ht 70.5 in | Wt 157.0 lb

## 2013-02-04 DIAGNOSIS — R52 Pain, unspecified: Secondary | ICD-10-CM

## 2013-02-04 DIAGNOSIS — R61 Generalized hyperhidrosis: Secondary | ICD-10-CM

## 2013-02-04 DIAGNOSIS — R509 Fever, unspecified: Secondary | ICD-10-CM

## 2013-02-04 DIAGNOSIS — E119 Type 2 diabetes mellitus without complications: Secondary | ICD-10-CM

## 2013-02-04 DIAGNOSIS — M2550 Pain in unspecified joint: Secondary | ICD-10-CM

## 2013-02-04 LAB — CBC WITH DIFFERENTIAL/PLATELET
Basophils Relative: 0 % (ref 0–1)
Eosinophils Absolute: 0 10*3/uL (ref 0.0–0.7)
Eosinophils Relative: 0 % (ref 0–5)
Hemoglobin: 9.2 g/dL — ABNORMAL LOW (ref 13.0–17.0)
Lymphs Abs: 0.6 10*3/uL — ABNORMAL LOW (ref 0.7–4.0)
MCH: 28.8 pg (ref 26.0–34.0)
MCHC: 34.1 g/dL (ref 30.0–36.0)
MCV: 84.4 fL (ref 78.0–100.0)
Monocytes Relative: 5 % (ref 3–12)
Neutrophils Relative %: 90 % — ABNORMAL HIGH (ref 43–77)

## 2013-02-04 LAB — COMPREHENSIVE METABOLIC PANEL
AST: 13 U/L (ref 0–37)
Albumin: 3.3 g/dL — ABNORMAL LOW (ref 3.5–5.2)
Alkaline Phosphatase: 83 U/L (ref 39–117)
BUN: 26 mg/dL — ABNORMAL HIGH (ref 6–23)
Potassium: 4.1 mEq/L (ref 3.5–5.3)
Sodium: 138 mEq/L (ref 135–145)
Total Bilirubin: 0.3 mg/dL (ref 0.3–1.2)
Total Protein: 6.8 g/dL (ref 6.0–8.3)

## 2013-02-04 LAB — HIV ANTIBODY (ROUTINE TESTING W REFLEX): HIV: NONREACTIVE

## 2013-02-04 NOTE — Patient Instructions (Addendum)
Thank you for coming in today, it was good to see you I am not sure what may be causing your symptoms, I am checking some lab work today. Continue your medications for your diabetes.

## 2013-02-04 NOTE — Assessment & Plan Note (Signed)
Has been pretty well controlled recently.  Typically running 100-120 fasting.  Check a1c today, continue current medications.  Planning on seeing eye doctor.

## 2013-02-04 NOTE — Assessment & Plan Note (Signed)
Chronic fever and nightsweats very concerning for something serious going on.  This along with his pain is particularly concerning for possible autoimmune disease vs. Malignancy vs. Infection vs other. History of elevated PSA, followed by Dr. Karsten Ro with negative biopsies previously.  Treated for cystitis in November with resolution of symptoms.  No pulmonary symptoms so unlikely TB. Plan to check ESR as well as CBC with diff and BMET to get better idea of what may be going on. Will also evaluate current immune status by checking HIV.

## 2013-02-04 NOTE — Assessment & Plan Note (Signed)
Please see a/p under todays visit for full a/p.  In addition will check CK given the variety of symptoms he is having.

## 2013-02-04 NOTE — Progress Notes (Signed)
  Subjective:    Patient ID: Richard Gill., male    DOB: [birth year], 57 y.o.   MRN: [id number]  HPI 1. Patient here today with multiple complaints.  His main complaint today is pain in multiple areas of his body.  He has been experiencing pain in his bilateral hips, shoulders and low back.  Initially started as low back and hip pain around 2-3 months ago, and progressed to pain in his shoulders as well.  Denies any radiation of pain or neurological symptoms.  He has also had fever to 102 nightly for the past couple of months along with night sweats.  He does endorse decreased appetite and a 5-10lb weight loss over the past few months as well.  He is currently being followed by Dr. Karsten Ro for his BPH.  He reports that he has had a prostate biopsy  Which was negative.  He denies shortness of breath, cough, swelling of joints, decreased urine output.  2. CHRONIC DIABETES  Disease Monitoring  Blood Sugar Ranges: 100-120 fasting  Polyuria: no   Visual problems: no   Medication Compliance: yes  Medication Side Effects  Hypoglycemia: no   Preventitive Health Care  Eye Exam: Planning on having soon  Diet pattern: Healthy  Exercise: Daily walking    Review of Systems Per HPI    Objective:   Physical Exam  Constitutional: He appears well-nourished. No distress.  HENT:  Head: Normocephalic and atraumatic.  Eyes: No scleral icterus.  Neck: Neck supple.  Cardiovascular: Normal rate, regular rhythm and normal heart sounds.   Pulmonary/Chest: Effort normal and breath sounds normal. No respiratory distress.  Abdominal: Soft. Bowel sounds are normal. He exhibits no distension. There is no tenderness.  Musculoskeletal: He exhibits no edema.  Mild tenderness along lower spine around SI.   Shoulder bilaterally: Inspection reveals no abnormalities, atrophy or asymmetry. Palpation is normal with no tenderness over AC joint or bicipital groove. ROM is full in all planes.  Hip No pain over  greater trochanteric area FROM              Assessment & Plan:

## 2013-02-05 ENCOUNTER — Telehealth: Payer: Self-pay | Admitting: Family Medicine

## 2013-02-05 DIAGNOSIS — R700 Elevated erythrocyte sedimentation rate: Secondary | ICD-10-CM

## 2013-02-05 DIAGNOSIS — M2550 Pain in unspecified joint: Secondary | ICD-10-CM

## 2013-02-05 DIAGNOSIS — R7989 Other specified abnormal findings of blood chemistry: Secondary | ICD-10-CM

## 2013-02-05 NOTE — Telephone Encounter (Signed)
Called patient to discuss lab results and left VM to call back.  Sed rate is very elevated and renal function has worsened since previous check.  He also has some mild anemia.  I would like for him to stop his lisinopril and return to have additional labs drawn. Will check UA as well given worsening renal function and to look for proteinuria.  I am also going to place a nephrology referral for him with for his worsening creatinine.

## 2013-02-08 ENCOUNTER — Other Ambulatory Visit: Payer: BC Managed Care – PPO

## 2013-02-08 DIAGNOSIS — R7989 Other specified abnormal findings of blood chemistry: Secondary | ICD-10-CM

## 2013-02-08 DIAGNOSIS — R700 Elevated erythrocyte sedimentation rate: Secondary | ICD-10-CM

## 2013-02-08 DIAGNOSIS — M2550 Pain in unspecified joint: Secondary | ICD-10-CM

## 2013-02-08 NOTE — Telephone Encounter (Signed)
Pt is returning Dr Zigmund Daniel call - pls call asap

## 2013-02-08 NOTE — Telephone Encounter (Signed)
Spoke with patient, he is coming in today for labs and the referral has already been sent to nephrology. Patient also instructed to stop the lisinopril.Alydia Gosser, [name]

## 2013-02-08 NOTE — Telephone Encounter (Signed)
Left message for patient to return call. Please read message from Dr Zigmund Daniel.Busick, [name]

## 2013-02-08 NOTE — Progress Notes (Signed)
ANA, Anti-DNA Ab, Anti- CCP drawn today. Failed to have pt collect urine sample.  Urine order cancelled. Unk Lightning, MLS

## 2013-02-09 ENCOUNTER — Other Ambulatory Visit: Payer: Self-pay | Admitting: Family Medicine

## 2013-02-09 DIAGNOSIS — M791 Myalgia, unspecified site: Secondary | ICD-10-CM

## 2013-02-09 DIAGNOSIS — R509 Fever, unspecified: Secondary | ICD-10-CM

## 2013-02-09 DIAGNOSIS — R768 Other specified abnormal immunological findings in serum: Secondary | ICD-10-CM

## 2013-02-09 NOTE — Progress Notes (Signed)
Patient with +dsdna antibody.  Negative ana and anti-ccp.  Will place urgent referral to rheumatology, concern for SLE given systemic symptoms.  Had UA done at [facility], will try to obtain results from them as well.

## 2013-02-14 ENCOUNTER — Emergency Department (HOSPITAL_COMMUNITY)
Admission: EM | Admit: 2013-02-14 | Discharge: 2013-02-14 | Disposition: A | Discharge status: Home / Self Care | Payer: BC Managed Care – PPO | Attending: Emergency Medicine | Admitting: Emergency Medicine

## 2013-02-14 ENCOUNTER — Encounter (HOSPITAL_COMMUNITY): Payer: Self-pay | Admitting: Emergency Medicine

## 2013-02-14 DIAGNOSIS — N400 Enlarged prostate without lower urinary tract symptoms: Secondary | ICD-10-CM | POA: Insufficient documentation

## 2013-02-14 DIAGNOSIS — Z79899 Other long term (current) drug therapy: Secondary | ICD-10-CM | POA: Insufficient documentation

## 2013-02-14 DIAGNOSIS — Z7982 Long term (current) use of aspirin: Secondary | ICD-10-CM | POA: Insufficient documentation

## 2013-02-14 DIAGNOSIS — Z794 Long term (current) use of insulin: Secondary | ICD-10-CM | POA: Insufficient documentation

## 2013-02-14 DIAGNOSIS — E119 Type 2 diabetes mellitus without complications: Secondary | ICD-10-CM | POA: Insufficient documentation

## 2013-02-14 DIAGNOSIS — R339 Retention of urine, unspecified: Secondary | ICD-10-CM

## 2013-02-14 DIAGNOSIS — R319 Hematuria, unspecified: Secondary | ICD-10-CM | POA: Insufficient documentation

## 2013-02-14 DIAGNOSIS — Z87891 Personal history of nicotine dependence: Secondary | ICD-10-CM | POA: Insufficient documentation

## 2013-02-14 DIAGNOSIS — R630 Anorexia: Secondary | ICD-10-CM | POA: Insufficient documentation

## 2013-02-14 DIAGNOSIS — E785 Hyperlipidemia, unspecified: Secondary | ICD-10-CM | POA: Insufficient documentation

## 2013-02-14 DIAGNOSIS — N189 Chronic kidney disease, unspecified: Secondary | ICD-10-CM | POA: Insufficient documentation

## 2013-02-14 LAB — POCT I-STAT, CHEM 8
Calcium, Ion: 1.13 mmol/L (ref 1.12–1.23)
HCT: 21 % — ABNORMAL LOW (ref 39.0–52.0)
Hemoglobin: 7.1 g/dL — ABNORMAL LOW (ref 13.0–17.0)
Sodium: 137 mEq/L (ref 135–145)
TCO2: 23 mmol/L (ref 0–100)

## 2013-02-14 LAB — URINALYSIS, MICROSCOPIC ONLY
Bilirubin Urine: NEGATIVE
Nitrite: NEGATIVE
Specific Gravity, Urine: 1.013 (ref 1.005–1.030)
pH: 5.5 (ref 5.0–8.0)

## 2013-02-14 NOTE — ED Notes (Signed)
Pt c/o inability to urinate since 3 am.  Hx of prostate issues.  Had catheter put in and taken out on Monday.

## 2013-02-14 NOTE — ED Provider Notes (Signed)
History     CSN: [id number]  Arrival date & time 02/14/13  0809   First MD Initiated Contact with Patient 02/14/13 [phone]      Chief Complaint  Patient presents with  . Urinary Retention    (Consider location/radiation/quality/duration/timing/severity/associated sxs/prior treatment) HPI Comments: This is a 57 year old man with history of BPH and CKD who presents today with urinary retention. He is followed by a urologist who saw him Monday for a bladder infection. He was treated with antibiotics and had been feeling better until 3am today. At that time he was unable to void. He states when he could void it did not feel as though he was emptying completely. He denies dysuria, fever, abdominal pain. He admits to gradual decrease in appetite over the past 3 weeks.   The history is provided by the patient. No language interpreter was used.    Past Medical History  Diagnosis Date  . T2DM (type 2 diabetes mellitus) 2005  . Hyperlipidemia   . BPH (benign prostatic hyperplasia)     Past Surgical History  Procedure Laterality Date  . Colonoscopy  2006    Dr. Benson Norway, was told to f/u in 5 years    Family History  Problem Relation Age of Onset  . Heart attack Father 61  . Colon cancer Sister 102  . Colon cancer Sister 60  . Breast cancer Mother 75    History  Substance Use Topics  . Smoking status: Former Smoker    Quit date: 01/28/1991  . Smokeless tobacco: Not on file  . Alcohol Use: No      Review of Systems  Constitutional: Positive for appetite change. Negative for fever and chills.  Genitourinary: Positive for difficulty urinating. Negative for dysuria.    Allergies  Review of patient's allergies indicates no known allergies.  Home Medications   Current Outpatient Rx  Name  Route  Sig  Dispense  Refill  . aspirin 325 MG tablet   Oral   Take 325 mg by mouth daily.         Marland Kitchen CINNAMON PO   Oral   Take 1 tablet by mouth daily.         . insulin glargine  (LANTUS SOLOSTAR) 100 UNIT/ML injection   Subcutaneous   Inject 12 Units into the skin every morning.   10 mL   12   . metFORMIN (GLUCOPHAGE) 1000 MG tablet   Oral   Take 1 tablet (1,000 mg total) by mouth 2 (two) times daily with a meal.   60 tablet   6   . RELION MINI PEN NEEDLES 31G X 6 MM MISC      USE AS DIRECTED DAILY   50 each   10   . simvastatin (ZOCOR) 40 MG tablet   Oral   Take 1 tablet (40 mg total) by mouth every evening.   30 tablet   6   . sulfamethoxazole-trimethoprim (BACTRIM DS) 800-160 MG per tablet   Oral   Take 1 tablet by mouth 2 (two) times daily. Started on 02-08-13         . Tamsulosin HCl (FLOMAX) 0.4 MG CAPS   Oral   Take 1 capsule (0.4 mg total) by mouth daily.   30 capsule   6     BP 117/71  Pulse 102  Temp(Src) 98 F (36.7 C) (Oral)  Resp 16  SpO2 100%  Physical Exam  Constitutional: He is oriented to person, place, and time. Vital  signs are normal. He appears well-developed and well-nourished. He does not appear ill. No distress.  HENT:  Head: Normocephalic and atraumatic.  Right Ear: External ear normal.  Left Ear: External ear normal.  Nose: Nose normal.  Eyes: Conjunctivae, EOM and lids are normal.  Neck: Trachea normal, normal range of motion and phonation normal.  Cardiovascular: Normal rate, regular rhythm, S2 normal, normal heart sounds, intact distal pulses and normal pulses.  Exam reveals no gallop and no friction rub.   No murmur heard. Pulmonary/Chest: Effort normal and breath sounds normal. No respiratory distress. He has no wheezes. He has no rales.  Abdominal: Soft. Normal appearance and bowel sounds are normal. He exhibits no distension. There is no tenderness. There is no rebound and no CVA tenderness.  Musculoskeletal: Normal range of motion.  Lymphadenopathy:    He has no cervical adenopathy.  Neurological: He is alert and oriented to person, place, and time.  Skin: Skin is warm and dry.  Psychiatric: He has  a normal mood and affect. His behavior is normal.    ED Course  Procedures (including critical care time)  Labs Reviewed  URINALYSIS, MICROSCOPIC ONLY - Abnormal; Notable for the following:    Hgb urine dipstick TRACE (*)    Leukocytes, UA SMALL (*)    All other components within normal limits  POCT I-STAT, CHEM 8 - Abnormal; Notable for the following:    Creatinine, Ser 1.70 (*)    Glucose, Bld 200 (*)    Hemoglobin 7.1 (*)    HCT 21.0 (*)    All other components within normal limits   No results found.   1. Urinary retention   2. Chronic kidney disease (CKD)   3. BPH (benign prostatic hyperplasia)       MDM  Patient's vitals stable through course of ED visit. Patient finished course of abx for bladder infection. UA unremarkable, no signs of infection. Creatinine shows stable CKD. Bladder scan showed >200 cc of fluid in bladder. Sent home with foley catheter in place. Strict instructions to follow up with urology tomorrow. This case was discussed with the attending who agrees with this plan and stability to send home. Return precautions given.         Elwyn Lade, PA-C 02/14/13 [phone]

## 2013-02-15 LAB — URINE CULTURE: Culture: NO GROWTH

## 2013-02-15 NOTE — ED Provider Notes (Signed)
Medical screening examination/treatment/procedure(s) were performed by non-physician practitioner and as supervising physician I was immediately available for consultation/collaboration.  Leota Jacobsen, MD 02/15/13 [phone]

## 2013-03-01 ENCOUNTER — Telehealth: Payer: Self-pay | Admitting: *Deleted

## 2013-03-01 NOTE — Telephone Encounter (Signed)
Left message to return call, please tell patient he has an appointment with Dr Trudie Reed, rheumatology, on 05/11/13 at 8:30 am. He needs to arrive 15 minutes early and bring a picture ID and insurance card. They are located on [address]. If unable to keep this appt he will need to call [phone] to reschedule.Royer Cristobal, [name]

## 2013-03-01 NOTE — Telephone Encounter (Signed)
Patient returned call and was given message.Richard Gill, [name]

## 2013-03-23 ENCOUNTER — Telehealth: Payer: Self-pay | Admitting: Oncology

## 2013-03-23 NOTE — Telephone Encounter (Signed)
S/W PT IN RE NP APPT 05/08 @ 10:30 W/DR. HA. REFERRING DR. [place] PACKET MAILED.

## 2013-03-26 ENCOUNTER — Telehealth: Payer: Self-pay | Admitting: Oncology

## 2013-03-26 NOTE — Telephone Encounter (Signed)
C/D 03/26/13 for appt. 04/15/13

## 2013-03-29 ENCOUNTER — Ambulatory Visit (INDEPENDENT_AMBULATORY_CARE_PROVIDER_SITE_OTHER): Payer: BC Managed Care – PPO | Admitting: Family Medicine

## 2013-03-29 ENCOUNTER — Encounter: Payer: Self-pay | Admitting: *Deleted

## 2013-03-29 ENCOUNTER — Encounter: Payer: Self-pay | Admitting: Family Medicine

## 2013-03-29 ENCOUNTER — Telehealth: Payer: Self-pay | Admitting: Family Medicine

## 2013-03-29 VITALS — BP 139/73 | HR 80 | Temp 98.5°F | Ht 70.0 in | Wt 156.0 lb

## 2013-03-29 DIAGNOSIS — L509 Urticaria, unspecified: Secondary | ICD-10-CM

## 2013-03-29 MED ORDER — HYDROXYZINE HCL 25 MG PO TABS: 25.0000 mg | ORAL_TABLET | Freq: Three times a day (TID) | ORAL | Status: DC | PRN

## 2013-03-29 NOTE — Assessment & Plan Note (Signed)
Discussed with patient and his wife that hives can be very difficult to find out cause.  Discussed possibilities including antibiotics, allergens, etc.  Rx for hydroxyzine in case they return- gave hand out on hives.

## 2013-03-29 NOTE — Progress Notes (Signed)
  Subjective:    Patient ID: Richard Gill., male    DOB: January 25, 1956, 57 y.o.   MRN: [id number]  HPI  Patient comes in due to hives that happened 2 days ago.  He says he had hives all over his abdomen, back, and thighs, and it itched badly.  He has not changed any detergents, soaps, etc.  However, the kidney doctor did give him an antibiotic for a UTI that he finished the day before the hives.  He cannot remember the name of it.  His wife is concerned because he has also been diagnosed with lymphoma and she read somewhere this can cause hives.    The hives have resolved today, they want to know what caused them and what to do if they come back.  He denies any wheezing, throat swelling, difficulty swallowing, fever, chills, or lymphadenopathy associated with the hives.   Review of Systems See HPI    Objective:   Physical Exam  BP 139/73  Pulse 80  Temp(Src) 98.5 F (36.9 C) (Oral)  Ht 5\' 10"  (1.778 m)  Wt 156 lb (70.761 kg)  BMI 22.38 kg/m2 General appearance: alert, cooperative and no distress Pulm: Lungs CTAB CV: RRR no m/r/g, 2+ peripheral pulses no edema Skin: Skin color, texture, turgor normal. No rashes or lesions      Assessment & Plan:

## 2013-03-29 NOTE — Telephone Encounter (Signed)
Needs to talk to nurse about him breaking out in hives - not sure what to do for him.  He has lymphoma and is not sure if it's related. Also having joint pain

## 2013-03-29 NOTE — Telephone Encounter (Signed)
Returned call to patient's wife.  Started breaking out with hives "all over" on Saturday (03/27/13).  Had itching and wife used her Triamcinolone cream and it helped with sxs.  Hives returned last night and continued using cream.  States sxs are better today, but wants to have patient checked due to his recent dx with lymphoma.  Has new patient appt with [facility] next month.  Denies any new meds, soaps, detergents.  States patient has fever and night sweats, but sxs are not new or related to recent hives.  Appt secheduled with overflow clinic for today at 3:30 pm.  Will try to work in with Dr. Zigmund Daniel if he has any cancellations or no-shows.  Nolene Ebbs, RN

## 2013-03-29 NOTE — Patient Instructions (Signed)
Hives Hives are itchy, red, swollen areas of the skin. They can vary in size and location on your body. Hives can come and go for hours or several days (acute hives) or for several weeks (chronic hives). Hives do not spread from person to person (noncontagious). They may get worse with scratching, exercise, and emotional stress. CAUSES   Allergic reaction to food, additives, or drugs.  Infections, including the common cold.  Illness, such as vasculitis, lupus, or thyroid disease.  Exposure to sunlight, heat, or cold.  Exercise.  Stress.  Contact with chemicals. SYMPTOMS   Red or white swollen patches on the skin. The patches may change size, shape, and location quickly and repeatedly.  Itching.  Swelling of the hands, feet, and face. This may occur if hives develop deeper in the skin. DIAGNOSIS  Your caregiver can usually tell what is wrong by performing a physical exam. Skin or blood tests may also be done to determine the cause of your hives. In some cases, the cause cannot be determined. TREATMENT  Mild cases usually get better with medicines such as antihistamines. Severe cases may require an emergency epinephrine injection. If the cause of your hives is known, treatment includes avoiding that trigger.  HOME CARE INSTRUCTIONS   Avoid causes that trigger your hives.  Take antihistamines as directed by your caregiver to reduce the severity of your hives. Non-sedating or low-sedating antihistamines are usually recommended. Do not drive while taking an antihistamine.  Take any other medicines prescribed for itching as directed by your caregiver.  Wear loose-fitting clothing.  Keep all follow-up appointments as directed by your caregiver. SEEK MEDICAL CARE IF:   You have persistent or severe itching that is not relieved with medicine.  You have painful or swollen joints. SEEK IMMEDIATE MEDICAL CARE IF:   You have a fever.  Your tongue or lips are swollen.  You have  trouble breathing or swallowing.  You feel tightness in the throat or chest.  You have abdominal pain. These problems may be the first sign of a life-threatening allergic reaction. Call your local emergency services (911 in [place] MAKE SURE YOU:   Understand these instructions.  Will watch your condition.  Will get help right away if you are not doing well or get worse. Document Released: 11/25/2005 Document Revised: 05/26/2012 Document Reviewed: 02/18/2012 [facility] Patient Information 2013 [place].

## 2013-04-14 ENCOUNTER — Encounter: Payer: Self-pay | Admitting: Oncology

## 2013-04-14 DIAGNOSIS — N184 Chronic kidney disease, stage 4 (severe): Secondary | ICD-10-CM | POA: Insufficient documentation

## 2013-04-14 DIAGNOSIS — N182 Chronic kidney disease, stage 2 (mild): Secondary | ICD-10-CM | POA: Insufficient documentation

## 2013-04-14 DIAGNOSIS — D472 Monoclonal gammopathy: Secondary | ICD-10-CM | POA: Insufficient documentation

## 2013-04-15 ENCOUNTER — Encounter: Payer: Self-pay | Admitting: Oncology

## 2013-04-15 ENCOUNTER — Ambulatory Visit (HOSPITAL_BASED_OUTPATIENT_CLINIC_OR_DEPARTMENT_OTHER): Payer: BC Managed Care – PPO

## 2013-04-15 ENCOUNTER — Other Ambulatory Visit (HOSPITAL_BASED_OUTPATIENT_CLINIC_OR_DEPARTMENT_OTHER): Payer: BC Managed Care – PPO | Admitting: Lab

## 2013-04-15 ENCOUNTER — Ambulatory Visit (HOSPITAL_BASED_OUTPATIENT_CLINIC_OR_DEPARTMENT_OTHER): Payer: BC Managed Care – PPO | Admitting: Oncology

## 2013-04-15 ENCOUNTER — Telehealth: Payer: Self-pay | Admitting: Oncology

## 2013-04-15 VITALS — BP 128/74 | HR 84 | Temp 98.0°F | Resp 20 | Ht 70.0 in | Wt 155.5 lb

## 2013-04-15 DIAGNOSIS — D472 Monoclonal gammopathy: Secondary | ICD-10-CM

## 2013-04-15 DIAGNOSIS — N182 Chronic kidney disease, stage 2 (mild): Secondary | ICD-10-CM

## 2013-04-15 DIAGNOSIS — D649 Anemia, unspecified: Secondary | ICD-10-CM

## 2013-04-15 LAB — CBC WITH DIFFERENTIAL/PLATELET
Basophils Absolute: 0 10*3/uL (ref 0.0–0.1)
Eosinophils Absolute: 0.1 10*3/uL (ref 0.0–0.5)
HCT: 30.6 % — ABNORMAL LOW (ref 38.4–49.9)
HGB: 10.5 g/dL — ABNORMAL LOW (ref 13.0–17.1)
MCV: 84.9 fL (ref 79.3–98.0)
MONO%: 6.6 % (ref 0.0–14.0)
NEUT#: 2.7 10*3/uL (ref 1.5–6.5)
NEUT%: 65.8 % (ref 39.0–75.0)
Platelets: 175 10*3/uL (ref 140–400)
RDW: 17.5 % — ABNORMAL HIGH (ref 11.0–14.6)

## 2013-04-15 LAB — COMPREHENSIVE METABOLIC PANEL (CC13)
AST: 17 U/L (ref 5–34)
Alkaline Phosphatase: 82 U/L (ref 40–150)
BUN: 16.6 mg/dL (ref 7.0–26.0)
Creatinine: 1.4 mg/dL — ABNORMAL HIGH (ref 0.7–1.3)

## 2013-04-15 NOTE — Progress Notes (Signed)
Checked in new patient. No financial issues. °

## 2013-04-15 NOTE — Progress Notes (Signed)
[place]  Telephone:(336) [phone] Fax:(336) [place]    Referral MD:  Edrick Oh, M.D.  Reason for Referral: monoclonal gammopathy.     HPI:  Mr. Richard Gill, Brooke Bonito is a 57 year-old man with DM, HLP.  He has had CKD and anemia for the past few years.  Colonoscopy with Dr. Benson Norway recently was reportedly negative. He has also had lower back pain that was thought to be due to UTI.  With antibiotic, his back pain has improved.   He was referred to Dr. Justin Mend for CKD.  On 03/16/2013, SPEP showed M-spike of 0.6gm/dL.  IgG 2367 mg/dL (ref [id number]), IgA 386 mg/dL (ref [phone]); IgM 90 mg/dL (ref 40-230).  He was kindly referred to the [facility] for evaluation.  Mr. Haris presented to the clinic for the first time today with is his wife.  He has slight anorexia and lost about 10 lb over the last year.  He denied fatigue.  He still works full time. He has mild joint pain in the hips and knees which he attributes to OA.  He denies fever, anorexia, weight loss, fatigue, headache, visual changes, confusion, drenching night sweats, palpable lymph node swelling, mucositis, odynophagia, dysphagia, nausea vomiting, jaundice, chest pain, palpitation, shortness of breath, dyspnea on exertion, productive cough, gum bleeding, epistaxis, hematemesis, hemoptysis, abdominal pain, abdominal swelling, early satiety, melena, hematochezia, hematuria, skin rash, spontaneous bleeding, heat or cold intolerance, bowel bladder incontinence, back pain, focal motor weakness, paresthesia.     Past Medical History  Diagnosis Date  . T2DM (type 2 diabetes mellitus) 2005  . Hyperlipidemia   . BPH (benign prostatic hyperplasia)   . Prostatitis   . Chronic kidney disease, stage II (mild)   . Monoclonal gammopathy   :    Past Surgical History  Procedure Laterality Date  . Colonoscopy  2014    Dr. Benson Norway, was told to f/u in 2017-2019  :   CURRENT MEDS: Current  Outpatient Prescriptions  Medication Sig Dispense Refill  . aspirin 325 MG tablet Take 325 mg by mouth daily.      Marland Kitchen CINNAMON PO Take 1 tablet by mouth daily.      . hydrOXYzine (ATARAX/VISTARIL) 25 MG tablet Take 1 tablet (25 mg total) by mouth 3 (three) times daily as needed for itching (or hives).  30 tablet  2  . insulin glargine (LANTUS SOLOSTAR) 100 UNIT/ML injection Inject 12 Units into the skin every morning.  10 mL  12  . metFORMIN (GLUCOPHAGE) 1000 MG tablet Take 1 tablet (1,000 mg total) by mouth 2 (two) times daily with a meal.  60 tablet  6  . Misc Natural Products (PROSTATE) CAPS Take by mouth. Take 3 capsules daily      . Multiple Vitamins-Minerals (MULTIVITAMIN WITH MINERALS) tablet Take 1 tablet by mouth daily.      . simvastatin (ZOCOR) 40 MG tablet Take 1 tablet (40 mg total) by mouth every evening.  30 tablet  6  . tamsulosin (FLOMAX) 0.4 MG CAPS Take 0.4 mg by mouth. Take 2 capsules daily      . RELION MINI PEN NEEDLES 31G X 6 MM MISC USE AS DIRECTED DAILY  50 each  10   No current facility-administered medications for this visit.      No Known Allergies:  Family History  Problem Relation Age of Onset  . Heart attack Father 72  . Colon cancer Sister 12  . Breast cancer  Sister   . Colon cancer Sister 48  :  History   Social History  . Marital Status: Married    Spouse Name: N/A    Number of Children: 8  . Years of Education: N/A   Occupational History  .      Building maintenance   Social History Main Topics  . Smoking status: Former Smoker -- 1.00 packs/day for 10 years    Quit date: 01/28/1991  . Smokeless tobacco: Never Used  . Alcohol Use: No  . Drug Use: No  . Sexually Active: Yes -- Male partner(s)   Other Topics Concern  . Not on file   Social History Narrative   Married to wife Freda Munro.  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  Enjoys working on his car.  High school graduate.  Former smoker, quit >20 years ago.  No drugs or  EtOH.  No regular exercise.  :  REVIEW OF SYSTEM:  The rest of the 14-point review of sytem was negative.   Exam:   General:  well-nourished man, in no acute distress.  Eyes:  no scleral icterus.  ENT:  There were no oropharyngeal lesions.  Neck was without thyromegaly.  Lymphatics:  Negative cervical, supraclavicular or axillary adenopathy.  Respiratory: lungs were clear bilaterally without wheezing or crackles.  Cardiovascular:  Regular rate and rhythm, S1/S2, without murmur, rub or gallop.  There was no pedal edema.  GI:  abdomen was soft, flat, nontender, nondistended, without organomegaly.  Muscoloskeletal:  no spinal tenderness of palpation of vertebral spine.  Skin exam was without echymosis, petichae.  Neuro exam was nonfocal.  Patient was able to get on and off exam table without assistance.  Gait was normal.  Patient was alert and oriented.  Attention was good.   Language was appropriate.  Mood was normal without depression.  Speech was not pressured.  Thought content was not tangential.    LABS:  Lab Results  Component Value Date   WBC 4.2 04/15/2013   HGB 10.5* 04/15/2013   HCT 30.6* 04/15/2013   PLT 175 04/15/2013   GLUCOSE 163* 04/15/2013   CHOL 168 10/30/2012   TRIG 120 10/30/2012   HDL 26* 10/30/2012   LDLCALC 118* 10/30/2012   ALT 12 04/15/2013   AST 17 04/15/2013   NA 137 04/15/2013   K 4.6 04/15/2013   CL 104 04/15/2013   CREATININE 1.4* 04/15/2013   BUN 16.6 04/15/2013   CO2 25 04/15/2013   PSA 7.60* 09/06/2011   HGBA1C 7.3 02/04/2013       ASSESSMENT AND PLAN:   1.  Issue:  Monoclonal gammopathy.  2.  Work up: need to rule out for active myeloma.  Other possibilities include MGUS, light chain deposition disease, smoldering myeloma, and amyloidosis.  3.  Recommend:  *  24-hour urine collection.   *  Skeletal Xray  *  Since there is renal insufficiency, and anemia, we will need to proceed with diagnostic bone marrow biopsy to rule out active myeloma.  Bone marrow biopsy is  scheduled for Tuesday 04/20/13 at 8:30am here at the [facility].  4.  Follow up on Friday 04/23/13 to discuss result of bone marrow biopsy.   Mr. Viereck and his wife expressed informed understanding and wished to proceed with work up including bone marrow biopsy as detailed above.    The length of time of the face-to-face encounter was 45 minutes. More than 50% of time was spent counseling and coordination of care.  Thank you for this referral.

## 2013-04-15 NOTE — Patient Instructions (Addendum)
1.  Issue:  Abnormal protein in the blood. 2.  Work up: need to rule out for active myeloma. 3.  Recommend:  *  24-hour urine collection.   *  Skeletal Xray  *  Since there is kidney failure, and anemia, we will need to proceed with diagnostic bone marrow biopsy to rule out active myeloma.  Bone marrow biopsy is scheduled for Tuesday 04/20/13 at 8:30am here at the [facility].  4.  Follow up on Friday 04/23/13 to discuss result of bone marrow biopsy.

## 2013-04-16 ENCOUNTER — Other Ambulatory Visit: Payer: Self-pay | Admitting: Radiology

## 2013-04-16 LAB — IRON AND TIBC
%SAT: 26 % (ref 20–55)
TIBC: 293 ug/dL (ref 215–435)

## 2013-04-16 LAB — FERRITIN: Ferritin: 74 ng/mL (ref 22–322)

## 2013-04-16 LAB — KAPPA/LAMBDA LIGHT CHAINS
Kappa:Lambda Ratio: 0.98 (ref 0.26–1.65)
Lambda Free Lght Chn: 2.63 mg/dL (ref 0.57–2.63)

## 2013-04-19 ENCOUNTER — Encounter (HOSPITAL_COMMUNITY): Payer: Self-pay | Admitting: Pharmacy Technician

## 2013-04-20 ENCOUNTER — Other Ambulatory Visit (HOSPITAL_COMMUNITY)
Admission: RE | Admit: 2013-04-20 | Discharge: 2013-04-20 | Disposition: A | Discharge status: Home / Self Care | Payer: BC Managed Care – PPO | Source: Ambulatory Visit | Attending: Oncology | Admitting: Oncology

## 2013-04-20 ENCOUNTER — Ambulatory Visit (HOSPITAL_BASED_OUTPATIENT_CLINIC_OR_DEPARTMENT_OTHER): Payer: BC Managed Care – PPO | Admitting: Oncology

## 2013-04-20 ENCOUNTER — Other Ambulatory Visit: Payer: BC Managed Care – PPO | Admitting: Lab

## 2013-04-20 VITALS — BP 122/74 | HR 58 | Temp 98.1°F | Resp 20

## 2013-04-20 DIAGNOSIS — D696 Thrombocytopenia, unspecified: Secondary | ICD-10-CM | POA: Insufficient documentation

## 2013-04-20 DIAGNOSIS — D649 Anemia, unspecified: Secondary | ICD-10-CM

## 2013-04-20 DIAGNOSIS — D72822 Plasmacytosis: Secondary | ICD-10-CM

## 2013-04-20 DIAGNOSIS — D472 Monoclonal gammopathy: Secondary | ICD-10-CM

## 2013-04-20 LAB — CBC
MCHC: 34.4 g/dL (ref 30.0–36.0)
RDW: 15.7 % — ABNORMAL HIGH (ref 11.5–15.5)

## 2013-04-20 LAB — BONE MARROW EXAM: Bone Marrow Exam: 298

## 2013-04-20 NOTE — Progress Notes (Signed)
Pt observed for 30 minutes post bone marrow biopsy to left hip. Pressure bandage dry and intact. No bleeding present.  Pt denies complaints or needs at this time. Post Bone Marrow instructions given to patient.

## 2013-04-20 NOTE — Procedures (Signed)
   [name]  Telephone:(336) [phone] Fax:(336) [phone]   BONE MARROW BIOPSY AND ASPIRATION   INDICATION:  Anemia, positive M-spike.  Rule out myeloma.   Procedure: After obtained from consent, Richard Allen. was placed in the prone position. Time out was performed verifying correct patient and procedure. The skin overlying the left posterior crest was prepped with Betadine and draped in the usual sterile fashion. The skin and periosteum were infiltrated with 15 mL of 2% lidocaine. A small puncture wound was made with #11 scalpel blade.  Bone marrow aspirate was obtained on the first pass of the aspiration needle.  One core biopsy was obtained through the same incision.   The aspirate was sent for routine histology, flow cytometry, and cytogenetics.  Core biopsy was sent for routine histology.   Richard Augusta. tolerated procedure well with minimal  blood loss and without immediate complication.   A sterile dressing was applied.   Sherryl Manges M.D. 04/20/2013

## 2013-04-20 NOTE — Patient Instructions (Addendum)
[facility] Discharge Instructions for Post Bone Marrow Procedure  Today you had a bone marrow biopsy and aspirate of Left hip   Please keep the pressure dressing in place for at least 24 hours.  Have someone check your dressing periodically for bleeding.  If needed you can reapply a pressure dressing to the site.  Take pain medication Tylenol as directed.  IF BLEEDING REOCCURS THAT SHOULD BE REPORTED IMMEDIATELY. Call the [facility] at [phone] if during business hours. Or report to the Emergency Room.   I have been informed and understand all the instructions given to me. I know to contact the clinic, my physician, or go to the Emergency Department if any problems should occur. I do not have any questions at this time, but understand that I may call the clinic during office hours at [phone]  should I have any questions or need assistance in obtaining follow up care.    __________________________________________  _____________  __________ Signature of Patient or Authorized Representative            Date                   Time    __________________________________________ Nurse's Signature

## 2013-04-20 NOTE — Progress Notes (Signed)
Please see bone marrow biopsy note dated today.

## 2013-04-21 LAB — UIFE/LIGHT CHAINS/TP QN, 24-HR UR
Albumin, U: DETECTED
Alpha 1, Urine: DETECTED — AB
Alpha 2, Urine: DETECTED — AB
Beta, Urine: DETECTED — AB
Free Kappa Lt Chains,Ur: 2.33 mg/dL (ref 0.14–2.42)
Free Kappa/Lambda Ratio: 21.18 ratio — ABNORMAL HIGH (ref 2.04–10.37)
Free Lambda Excretion/Day: 2.2 mg/d
Free Lambda Lt Chains,Ur: 0.11 mg/dL (ref 0.02–0.67)
Free Lt Chn Excr Rate: 46.6 mg/d
Gamma Globulin, Urine: DETECTED — AB
Time: 24 h
Total Protein, Urine-Ur/day: 124 mg/d (ref 10–140)
Total Protein, Urine: 6.2 mg/dL
Volume, Urine: 2000 mL

## 2013-04-22 ENCOUNTER — Ambulatory Visit (HOSPITAL_COMMUNITY)
Admission: RE | Admit: 2013-04-22 | Discharge: 2013-04-22 | Disposition: A | Discharge status: Home / Self Care | Payer: BC Managed Care – PPO | Source: Ambulatory Visit | Attending: Oncology | Admitting: Oncology

## 2013-04-22 DIAGNOSIS — M542 Cervicalgia: Secondary | ICD-10-CM | POA: Insufficient documentation

## 2013-04-22 DIAGNOSIS — E119 Type 2 diabetes mellitus without complications: Secondary | ICD-10-CM | POA: Insufficient documentation

## 2013-04-22 DIAGNOSIS — N182 Chronic kidney disease, stage 2 (mild): Secondary | ICD-10-CM

## 2013-04-22 DIAGNOSIS — D472 Monoclonal gammopathy: Secondary | ICD-10-CM | POA: Insufficient documentation

## 2013-04-22 DIAGNOSIS — M4120 Other idiopathic scoliosis, site unspecified: Secondary | ICD-10-CM | POA: Insufficient documentation

## 2013-04-22 DIAGNOSIS — M5030 Other cervical disc degeneration, unspecified cervical region: Secondary | ICD-10-CM | POA: Insufficient documentation

## 2013-04-23 ENCOUNTER — Ambulatory Visit (HOSPITAL_COMMUNITY): Admission: RE | Admit: 2013-04-23 | Payer: BC Managed Care – PPO | Source: Ambulatory Visit

## 2013-04-23 ENCOUNTER — Telehealth: Payer: Self-pay | Admitting: Oncology

## 2013-04-23 ENCOUNTER — Ambulatory Visit (HOSPITAL_BASED_OUTPATIENT_CLINIC_OR_DEPARTMENT_OTHER): Payer: BC Managed Care – PPO | Admitting: Oncology

## 2013-04-23 ENCOUNTER — Other Ambulatory Visit: Payer: Self-pay | Admitting: Oncology

## 2013-04-23 ENCOUNTER — Inpatient Hospital Stay (HOSPITAL_COMMUNITY): Admission: RE | Admit: 2013-04-23 | Payer: BC Managed Care – PPO | Source: Ambulatory Visit

## 2013-04-23 ENCOUNTER — Telehealth: Payer: Self-pay | Admitting: *Deleted

## 2013-04-23 VITALS — BP 137/78 | HR 94 | Temp 98.0°F | Resp 18 | Ht 70.0 in | Wt 152.2 lb

## 2013-04-23 DIAGNOSIS — D472 Monoclonal gammopathy: Secondary | ICD-10-CM

## 2013-04-23 DIAGNOSIS — N189 Chronic kidney disease, unspecified: Secondary | ICD-10-CM

## 2013-04-23 DIAGNOSIS — C9000 Multiple myeloma not having achieved remission: Secondary | ICD-10-CM

## 2013-04-23 DIAGNOSIS — D631 Anemia in chronic kidney disease: Secondary | ICD-10-CM

## 2013-04-23 NOTE — Patient Instructions (Addendum)
1.  Issues:  Chronic kidney disease and anemia. 2.  Bone marrow biopsy showed 11% plasma cell but not arranging in sheets or aggregates. 3.  Skeletal X-ray showed no lytic bone lesions. 4.  Potential causes of chronic kidney disease and anemia:  *  Diabetes mellitus:  Normally not this severe anemia (Hgb of 7).  *  Multiple myeloma:  It's difficult to attribute his chronic kidney disease and anemia entirely to myeloma.  Bone marrow biopsy only showed 11% myeloma and blood myeloma markers were not all that impressive (Mspike of <1; and normal light chain).  Urine protein electrophoresis did not show evidence of myeloma either.  5.  Recommendations:    *  Kidney biopsy:  If there is evidence of myeloma on kidney biopsy, then start chemo.  *  2nd opinion at [facility] or [place].

## 2013-04-23 NOTE — Telephone Encounter (Signed)
Wife called to inform Dr. Lamonte Sakai that pt would like referral to see Dr. Terrilyn Saver at [facility].

## 2013-04-23 NOTE — Progress Notes (Signed)
[place]  Telephone:(336) [phone] Fax:(336) [phone]   OFFICE PROGRESS NOTE   Cc:  MATTHEWS,CODY, DO  DIAGNOSIS:  Smoldering myeloma.   CURRENT THERAPY: watchful observation.   INTERVAL HISTORY: Richard Gill. 57 y.o. male returns for regular follow up to discuss result of work up.  He reported feeling relatively well and denied symptoms.   Past Medical History  Diagnosis Date  . T2DM (type 2 diabetes mellitus) 2005  . Hyperlipidemia   . BPH (benign prostatic hyperplasia)   . Prostatitis   . Chronic kidney disease, stage II (mild)   . Monoclonal gammopathy     Past Surgical History  Procedure Laterality Date  . Colonoscopy  2014    Dr. Benson Norway, was told to f/u in 2017-2019    Current Outpatient Prescriptions  Medication Sig Dispense Refill  . aspirin 325 MG tablet Take 325 mg by mouth daily.      . Cinnamon 500 MG TABS Take 1 tablet by mouth daily.      . hydrOXYzine (ATARAX/VISTARIL) 25 MG tablet Take 1 tablet (25 mg total) by mouth 3 (three) times daily as needed for itching (or hives).  30 tablet  2  . insulin glargine (LANTUS SOLOSTAR) 100 UNIT/ML injection Inject 12 Units into the skin every morning.  10 mL  12  . metFORMIN (GLUCOPHAGE) 1000 MG tablet Take 1 tablet (1,000 mg total) by mouth 2 (two) times daily with a meal.  60 tablet  6  . Misc Natural Products (PROSTATE) CAPS Take 1 capsule by mouth 3 (three) times daily.       . Multiple Vitamins-Minerals (MULTIVITAMIN WITH MINERALS) tablet Take 1 tablet by mouth daily.      . simvastatin (ZOCOR) 40 MG tablet Take 1 tablet (40 mg total) by mouth every evening.  30 tablet  6  . tamsulosin (FLOMAX) 0.4 MG CAPS Take 0.8 mg by mouth daily after breakfast.        No current facility-administered medications for this visit.    ALLERGIES:  has No Known Allergies.  REVIEW OF SYSTEMS:  The rest of the 14-point review of system was negative.   Filed Vitals:   04/23/13 0836  BP: 137/78  Pulse: 94    Temp: 98 F (36.7 C)  Resp: 18   Wt Readings from Last 3 Encounters:  04/23/13 152 lb 3.2 oz (69.037 kg)  04/15/13 155 lb 8 oz (70.534 kg)  03/29/13 156 lb (70.761 kg)   ECOG Performance status: 0  PHYSICAL EXAMINATION:  Was deferred since there was no new symptoms compared to last visit.  His bone marrow biopsy site appeared to have healed without erythema, pain, purulent discharge.   Dg Bone Survey Met  04/22/2013   *RADIOLOGY REPORT*  Clinical Data: Monoclonal gammopathy, question myeloma, history diabetes  METASTATIC BONE SURVEY  Comparison: None  Findings: Degenerative disc and facet disease changes cervical spine with broad-based levoconvex cervicothoracic scoliosis. Question small lucent foci at either medial right clavicle or anterior right first rib on the right shoulder radiograph are not seen on the AP view of the thoracic spine, suspect artifacts. No definite lytic or destructive osseous foci identified. Subsegmental atelectasis right lung base.  IMPRESSION: No definite lytic or destructive process identified to suggest multiple myeloma. Subsegmental atelectasis right lung base. Degenerative disc and facet disease changes cervical spine with mild broad-based cervicothoracic levoconvex scoliosis.   Original Report Authenticated By: Lavonia Dana, M.D.       ASSESSMENT AND PLAN:  1.  Issues:  Chronic kidney disease and anemia. 2.  Bone marrow biopsy showed 11% plasma cell but not arranging in sheets or aggregates. 3.  Skeletal X-ray showed no lytic bone lesions. 4.  Potential causes of chronic kidney disease and anemia:  *  Diabetes mellitus:  Normally not this severe anemia (Hgb of 7).  *  Multiple myeloma:  It's difficult to attribute his chronic kidney disease and anemia entirely to myeloma.  Bone marrow biopsy only showed 11% myeloma and blood myeloma markers were not all that impressive (Mspike of <1; and normal light chain).  Urine protein electrophoresis did not show  evidence of myeloma either.  5.  Recommendations:    *  Kidney biopsy:  If there is evidence of myeloma on kidney biopsy, then start chemo.  *  2nd opinion.  [name] and his wife would like to pursue 2nd opinion at [facility].    I personally dicussed the case with Dr. Edrick Oh.  Since there was no urine Bence Jones protein or elevated light chain, we both suspect that his renal biopsy will probably not show myeloma involvement.   Follow up in about 3 months.  I advised [name] to contact us in there interval if he develops concerning symptoms.    I informed him that I am leaving the practice.  The [facility] will arrange for him to see a new provider when he returns.     The length of time of the face-to-face encounter was 25 minutes. More than 50% of time was spent counseling and coordination of care.        Huan T. Lamonte Sakai, M.D.

## 2013-04-29 ENCOUNTER — Other Ambulatory Visit: Payer: Self-pay | Admitting: Oncology

## 2013-04-29 DIAGNOSIS — D472 Monoclonal gammopathy: Secondary | ICD-10-CM

## 2013-04-30 ENCOUNTER — Ambulatory Visit (INDEPENDENT_AMBULATORY_CARE_PROVIDER_SITE_OTHER): Payer: BC Managed Care – PPO | Admitting: Family Medicine

## 2013-04-30 ENCOUNTER — Encounter: Payer: Self-pay | Admitting: Family Medicine

## 2013-04-30 VITALS — BP 135/83 | HR 99 | Temp 98.6°F | Ht 70.0 in | Wt 154.1 lb

## 2013-04-30 DIAGNOSIS — N182 Chronic kidney disease, stage 2 (mild): Secondary | ICD-10-CM

## 2013-04-30 DIAGNOSIS — E119 Type 2 diabetes mellitus without complications: Secondary | ICD-10-CM

## 2013-04-30 DIAGNOSIS — R21 Rash and other nonspecific skin eruption: Secondary | ICD-10-CM

## 2013-04-30 LAB — POCT GLYCOSYLATED HEMOGLOBIN (HGB A1C): Hemoglobin A1C: 7.4

## 2013-04-30 NOTE — Patient Instructions (Addendum)
Thank you for coming in today, it was good to see you I will let you know the results of your biopsy Your diabetes appears well controlled.  Your A1c is 7.4 Follow up in 3 months or sooner as neeed.

## 2013-05-03 DIAGNOSIS — R21 Rash and other nonspecific skin eruption: Secondary | ICD-10-CM | POA: Insufficient documentation

## 2013-05-03 NOTE — Assessment & Plan Note (Signed)
Being followed by renal and oncology, worked up for multiple myeloma.  Also had + ds dna antibody and has appt upcoming with rheumatology.  Have discussed renal bx in the past, he is unsure if he will undergo this.

## 2013-05-03 NOTE — Assessment & Plan Note (Signed)
Appears to be petechial in nature.  Platelets 145 on 5/13.  No other signs of bleeding or platelet dysfunction.  Punch biopsy done today to further differentiate rash.

## 2013-05-03 NOTE — Progress Notes (Signed)
  Subjective:    Patient ID: Richard Gill., male    DOB: May 25, 1956, 57 y.o.   MRN: [id number]  Rash    1. Rash:  Here with c/o rash x1-2 weeks.  Rash is on Legs and trunk and itchy.  Has not changed foods, soaps, lotions, fragrances etc.  Denies pain.  He is currently being worked up for possible multiple myeloma, also Anti DS Dna positive.  Denies recent fevers since rash appeared.  2. DM:  CHRONIC DIABETES  Disease Monitoring  Blood Sugar Ranges: Does not check that often, but typically around 150  Polyuria: no   Visual problems: no   Medication Compliance: yes  Medication Side Effects  Hypoglycemia: no   Preventitive Health Care  Eye Exam: Retinal photography today  3. CKD:  Referred to renal for ARF and found to have M spike.  Currently being evaluated for multiple myeloma, plans to have 2nd opinion at [facility].  He also has + anti ds dna and has appt with rheumatology.  He denies any difficulty with making or passing urine.   Past Medical History  Diagnosis Date  . T2DM (type 2 diabetes mellitus) 2005  . Hyperlipidemia   . BPH (benign prostatic hyperplasia)   . Prostatitis   . Chronic kidney disease, stage II (mild)   . Monoclonal gammopathy     Review of Systems  Skin: Positive for rash.   Per HPI    Objective:   Physical Exam  Constitutional: He appears well-nourished. No distress.  HENT:  Head: Normocephalic and atraumatic.  Cardiovascular: Normal rate and regular rhythm.   Musculoskeletal: He exhibits no edema.  Skin:  Rash on lower legs and trunk, non blanching almost petechial appearing.  No surrounding erythema.      Procedure: After informed written consent was obtained, using Betadine for cleansing and 1% Lidocaine with epinephrine for anesthetic, with sterile technique a 3.5 mm punch biopsy was used to obtain a biopsy specimen of the lesion. Hemostasis was obtained by pressure and wound was not sutured. Antibiotic dressing is applied, and wound care  instructions provided. Be alert for any signs of cutaneous infection. The specimen is labeled and sent to pathology for evaluation. The procedure was well tolerated without complications.        Assessment & Plan:

## 2013-05-03 NOTE — Assessment & Plan Note (Signed)
Fairly well controlled.  WIll make no further changes to medications given his fluctuating renal function.  Continue diet and exercise.

## 2013-05-05 LAB — CHROMOSOME ANALYSIS, BONE MARROW

## 2013-05-11 ENCOUNTER — Encounter: Payer: Self-pay | Admitting: Oncology

## 2013-05-12 ENCOUNTER — Telehealth: Payer: Self-pay | Admitting: Oncology

## 2013-05-12 NOTE — Telephone Encounter (Signed)
Pt appt. With  Dr. Hoyt Koch @ [facility] is 06/15/13@2 :00. Medical records faxed. Pt is aware.

## 2013-05-21 ENCOUNTER — Encounter: Payer: Self-pay | Admitting: Oncology

## 2013-06-17 ENCOUNTER — Other Ambulatory Visit: Payer: Self-pay

## 2013-06-28 ENCOUNTER — Other Ambulatory Visit: Payer: Self-pay | Admitting: Family Medicine

## 2013-07-16 ENCOUNTER — Other Ambulatory Visit (HOSPITAL_BASED_OUTPATIENT_CLINIC_OR_DEPARTMENT_OTHER): Payer: BC Managed Care – PPO

## 2013-07-16 DIAGNOSIS — D472 Monoclonal gammopathy: Secondary | ICD-10-CM

## 2013-07-16 LAB — CBC WITH DIFFERENTIAL/PLATELET
Basophils Absolute: 0 10*3/uL (ref 0.0–0.1)
Eosinophils Absolute: 0.1 10*3/uL (ref 0.0–0.5)
HGB: 10.9 g/dL — ABNORMAL LOW (ref 13.0–17.1)
MCV: 85.9 fL (ref 79.3–98.0)
MONO#: 0.2 10*3/uL (ref 0.1–0.9)
MONO%: 5 % (ref 0.0–14.0)
NEUT#: 2.8 10*3/uL (ref 1.5–6.5)
RDW: 13.3 % (ref 11.0–14.6)
WBC: 4.2 10*3/uL (ref 4.0–10.3)
lymph#: 1.1 10*3/uL (ref 0.9–3.3)

## 2013-07-16 LAB — COMPREHENSIVE METABOLIC PANEL (CC13)
ALT: 16 U/L (ref 0–55)
AST: 22 U/L (ref 5–34)
CO2: 24 mEq/L (ref 22–29)
Calcium: 9.5 mg/dL (ref 8.4–10.4)
Chloride: 107 mEq/L (ref 98–109)
Potassium: 4.2 mEq/L (ref 3.5–5.1)
Sodium: 140 mEq/L (ref 136–145)
Total Protein: 8.1 g/dL (ref 6.4–8.3)

## 2013-07-20 LAB — PROTEIN ELECTROPHORESIS, SERUM
Albumin ELP: 54.3 % — ABNORMAL LOW (ref 55.8–66.1)
Alpha-1-Globulin: 6.7 % — ABNORMAL HIGH (ref 2.9–4.9)
Alpha-2-Globulin: 6.8 % — ABNORMAL LOW (ref 7.1–11.8)
Beta 2: 4.5 % (ref 3.2–6.5)
Beta Globulin: 6.1 % (ref 4.7–7.2)
Total Protein, Serum Electrophoresis: 6.3 g/dL (ref 6.0–8.3)

## 2013-07-20 LAB — KAPPA/LAMBDA LIGHT CHAINS
Kappa:Lambda Ratio: 0.78 (ref 0.26–1.65)
Lambda Free Lght Chn: 2.19 mg/dL (ref 0.57–2.63)

## 2013-07-23 ENCOUNTER — Telehealth: Payer: Self-pay | Admitting: Hematology and Oncology

## 2013-07-23 ENCOUNTER — Ambulatory Visit (HOSPITAL_BASED_OUTPATIENT_CLINIC_OR_DEPARTMENT_OTHER): Payer: BC Managed Care – PPO | Admitting: Hematology and Oncology

## 2013-07-23 VITALS — BP 130/78 | HR 63 | Temp 97.2°F | Resp 19 | Ht 70.0 in | Wt 156.2 lb

## 2013-07-23 DIAGNOSIS — D649 Anemia, unspecified: Secondary | ICD-10-CM

## 2013-07-23 DIAGNOSIS — N182 Chronic kidney disease, stage 2 (mild): Secondary | ICD-10-CM

## 2013-07-23 DIAGNOSIS — D472 Monoclonal gammopathy: Secondary | ICD-10-CM

## 2013-07-23 DIAGNOSIS — C9000 Multiple myeloma not having achieved remission: Secondary | ICD-10-CM

## 2013-07-23 NOTE — Telephone Encounter (Signed)
gv and printed appt sched and avs for pt  °

## 2013-07-25 NOTE — Progress Notes (Addendum)
ID: [name]. OB: 1956-12-09  MR#: [id number]  [id number]  PCP: Chrisandra Netters, MD   DIAGNOSIS: Smoldering myeloma.   CURRENT THERAPY: watchful observation.   INTERVAL HISTORY:  Richard Gill. 57 y.o. male returns for regular follow up visit. He reported feeling relatively well and denied symptoms. His appetite is good and weight is stable. His pain in hips and shoulders gone when he stopped simvastatin one month ago.The patient denied fever, chills, night sweats. He denied headaches, double vision, blurry vision, nasal congestion, nasal discharge, hearing problems, odynophagia or dysphagia. No chest pain, palpitations, dyspnea, cough, abdominal pain, nausea, vomiting, diarrhea, constipation, hematochezia. The patient denied dysuria, nocturia, polyuria, hematuria, myalgia, numbness, tingling, psychiatric problems.  Review of Systems  Constitutional: Negative for fever, chills, weight loss, malaise/fatigue and diaphoresis.  HENT: Negative for hearing loss, ear pain, nosebleeds, congestion, sore throat, neck pain, tinnitus and ear discharge.   Eyes: Negative for blurred vision, double vision, photophobia and pain.  Respiratory: Negative for cough, hemoptysis, sputum production, shortness of breath and wheezing.   Cardiovascular: Negative for chest pain, palpitations, orthopnea, claudication, leg swelling and PND.  Gastrointestinal: Negative for heartburn, nausea, vomiting, abdominal pain, diarrhea, constipation, blood in stool and melena.  Genitourinary: Negative for dysuria, urgency, frequency, hematuria and flank pain.  Musculoskeletal: Negative for myalgias, back pain and joint pain.  Skin: Negative for itching and rash.  Neurological: Negative for dizziness, tingling, tremors, sensory change, speech change, focal weakness, loss of consciousness, weakness and headaches.  Endo/Heme/Allergies: Does not bruise/bleed easily.  Psychiatric/Behavioral: Negative.     PAST MEDICAL  HISTORY: Past Medical History  Diagnosis Date  . T2DM (type 2 diabetes mellitus) 2005  . Hyperlipidemia   . BPH (benign prostatic hyperplasia)   . Prostatitis   . Chronic kidney disease, stage II (mild)   . 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     PAST SURGICAL HISTORY: Past Surgical History  Procedure Laterality Date  . Colonoscopy  2014    Dr. Benson Norway, was told to f/u in 2017-2019    FAMILY HISTORY Family History  Problem Relation Age of Onset  . Heart attack Father 17  . Colon cancer Sister 30  . Breast cancer Sister   . Colon cancer Sister 31   HEALTH MAINTENANCE: History  Substance Use Topics  . Smoking status: Former Smoker -- 1.00 packs/day for 10 years    Quit date: 01/28/1991  . Smokeless tobacco: Never Used  . Alcohol Use: No   No Known Allergies  Current Outpatient Prescriptions  Medication Sig Dispense Refill  . aspirin 325 MG tablet Take 325 mg by mouth daily.      . Cinnamon 500 MG TABS Take 1 tablet by mouth daily.      . hydrOXYzine (ATARAX/VISTARIL) 25 MG tablet TAKE ONE TABLET BY MOUTH THREE TIMES DAILY AS NEEDED FOR ITCHING OR HIVES  30 tablet  1  . insulin glargine (LANTUS SOLOSTAR) 100 UNIT/ML injection Inject 12 Units into the skin every morning.  10 mL  12  . metFORMIN (GLUCOPHAGE) 1000 MG tablet Take 1 tablet (1,000 mg total) by mouth 2 (two) times daily with a meal.  60 tablet  6  . Misc Natural Products (PROSTATE) CAPS Take 1 capsule by mouth 3 (three) times daily.       . Multiple Vitamins-Minerals (MULTIVITAMIN WITH MINERALS) tablet Take 1 tablet by mouth daily.      . simvastatin (ZOCOR) 40 MG tablet  Take 1 tablet (40 mg total) by mouth every evening.  30 tablet  6  . tamsulosin (FLOMAX) 0.4 MG CAPS Take 0.8 mg by mouth daily after breakfast.        No current facility-administered medications for this visit.    OBJECTIVE: Filed Vitals:   07/23/13 1023  BP: 130/78  Pulse: 63   Temp: 97.2 F (36.2 C)  Resp: 19     Body mass index is 22.41 kg/(m^2).    ECOG FS:0  HEENT: Sclerae anicteric.  Conjunctivae were pink. Pupils round and reactive bilaterally. Oral mucosa is moist without ulceration or thrush. No occipital, submandibular, cervical, supraclavicular or axillar adenopathy. Lungs: clear to auscultation without wheezes. No rales or rhonchi. Heart: regular rate and rhythm. No murmur, gallop or rubs. Abdomen: soft, non tender. No guarding or rebound tenderness. Bowel sounds are present. No palpable hepatosplenomegaly. MSK: no focal spinal tenderness. Extremities: No clubbing or cyanosis.No calf tenderness to palpitation, no peripheral edema. The patient had grossly intact strength in upper and lower extremities Neuro: non-focal, alert and oriented to time, person and place, appropriate affect  LAB RESULTS:  CMP     Component Value Date/Time   NA 140 07/16/2013 0913   NA 137 02/14/2013 0954   K 4.2 07/16/2013 0913   K 4.4 02/14/2013 0954   CL 104 04/15/2013 1042   CL 106 02/14/2013 0954   CO2 24 07/16/2013 0913   CO2 19 02/04/2013 1001   GLUCOSE 184* 07/16/2013 0913   GLUCOSE 163* 04/15/2013 1042   GLUCOSE 200* 02/14/2013 0954   BUN 17.8 07/16/2013 0913   BUN 16 02/14/2013 0954   CREATININE 1.4* 07/16/2013 0913   CREATININE 1.70* 02/14/2013 0954   CREATININE 1.98* 02/04/2013 1001   CALCIUM 9.5 07/16/2013 0913   CALCIUM 8.2* 02/04/2013 1001   PROT 8.1 07/16/2013 0913   PROT 6.8 02/04/2013 1001   ALBUMIN 3.8 07/16/2013 0913   ALBUMIN 3.3* 02/04/2013 1001   AST 22 07/16/2013 0913   AST 13 02/04/2013 1001   ALT 16 07/16/2013 0913   ALT 10 02/04/2013 1001   ALKPHOS 95 07/16/2013 0913   ALKPHOS 83 02/04/2013 1001   BILITOT 0.56 07/16/2013 0913   BILITOT 0.3 02/04/2013 1001   GFRNONAA 53* 10/12/2012 0708   GFRAA 61* 10/12/2012 0708    I No results found for this basename: SPEP, UPEP,  kappa and lambda light chains    Lab Results  Component Value Date   WBC 4.2 07/16/2013   NEUTROABS 2.8  07/16/2013   HGB 10.9* 07/16/2013   HCT 30.4* 07/16/2013   MCV 85.9 07/16/2013   PLT 152 07/16/2013      Chemistry      Component Value Date/Time   NA 140 07/16/2013 0913   NA 137 02/14/2013 0954   K 4.2 07/16/2013 0913   K 4.4 02/14/2013 0954   CL 104 04/15/2013 1042   CL 106 02/14/2013 0954   CO2 24 07/16/2013 0913   CO2 19 02/04/2013 1001   BUN 17.8 07/16/2013 0913   BUN 16 02/14/2013 0954   CREATININE 1.4* 07/16/2013 0913   CREATININE 1.70* 02/14/2013 0954   CREATININE 1.98* 02/04/2013 1001      Component Value Date/Time   CALCIUM 9.5 07/16/2013 0913   CALCIUM 8.2* 02/04/2013 1001   ALKPHOS 95 07/16/2013 0913   ALKPHOS 83 02/04/2013 1001   AST 22 07/16/2013 0913   AST 13 02/04/2013 1001   ALT 16 07/16/2013 0913   ALT 10 02/04/2013 1001  BILITOT 0.56 07/16/2013 0913   BILITOT 0.3 02/04/2013 1001       STUDIES: No results found.  ASSESSMENT AND PLAN:  1. Smoldering multiple myeloma. The patient had intensive work up for multiple myeloma at [facility]. PET from 07/09/13 which showed lucent lesion in the posterior aspect of the left femoral head which does not show increase FDG uptake. No avid osseous lesion identified. M-spike is  0.66 g/dL today. Anemia. According to patient his mother mention that he had anemia in childhood. University requested information from PCP to continue and finish work up for anemia. I will follow work up for anemia and if it  needs I will do additional testing. Anemia probably secondary to CKD.  Follow up in about 1 months. I advised Mr. Deadmond to contact us in there interval if he develops concerning symptoms.   Conni Slipper, MD   07/25/2013 10:46 PM

## 2013-08-11 ENCOUNTER — Other Ambulatory Visit: Payer: Self-pay | Admitting: Family Medicine

## 2013-08-12 ENCOUNTER — Telehealth: Payer: Self-pay | Admitting: Family Medicine

## 2013-08-12 NOTE — Telephone Encounter (Signed)
To [facility] red team - please call pt and let him know I have refilled one month's worth of his metformin but he needs to schedule an appointment to follow up on his diabetes and meet his new PCP. Thanks!

## 2013-08-12 NOTE — Telephone Encounter (Signed)
LMOM advising pt of message per Dr Ardelia Mems

## 2013-08-15 ENCOUNTER — Emergency Department (HOSPITAL_COMMUNITY)
Admission: EM | Admit: 2013-08-15 | Discharge: 2013-08-15 | Disposition: A | Discharge status: Home / Self Care | Payer: BC Managed Care – PPO | Attending: Emergency Medicine | Admitting: Emergency Medicine

## 2013-08-15 ENCOUNTER — Encounter (HOSPITAL_COMMUNITY): Payer: Self-pay

## 2013-08-15 DIAGNOSIS — N182 Chronic kidney disease, stage 2 (mild): Secondary | ICD-10-CM | POA: Insufficient documentation

## 2013-08-15 DIAGNOSIS — Z7982 Long term (current) use of aspirin: Secondary | ICD-10-CM | POA: Insufficient documentation

## 2013-08-15 DIAGNOSIS — E119 Type 2 diabetes mellitus without complications: Secondary | ICD-10-CM | POA: Insufficient documentation

## 2013-08-15 DIAGNOSIS — Z794 Long term (current) use of insulin: Secondary | ICD-10-CM | POA: Insufficient documentation

## 2013-08-15 DIAGNOSIS — Z87448 Personal history of other diseases of urinary system: Secondary | ICD-10-CM | POA: Insufficient documentation

## 2013-08-15 DIAGNOSIS — Z87891 Personal history of nicotine dependence: Secondary | ICD-10-CM | POA: Insufficient documentation

## 2013-08-15 DIAGNOSIS — R739 Hyperglycemia, unspecified: Secondary | ICD-10-CM

## 2013-08-15 DIAGNOSIS — Z79899 Other long term (current) drug therapy: Secondary | ICD-10-CM | POA: Insufficient documentation

## 2013-08-15 DIAGNOSIS — R339 Retention of urine, unspecified: Secondary | ICD-10-CM

## 2013-08-15 LAB — URINALYSIS, ROUTINE W REFLEX MICROSCOPIC
Leukocytes, UA: NEGATIVE
Specific Gravity, Urine: 1.022 (ref 1.005–1.030)
Urobilinogen, UA: 0.2 mg/dL (ref 0.0–1.0)

## 2013-08-15 LAB — GLUCOSE, CAPILLARY: Glucose-Capillary: 309 mg/dL — ABNORMAL HIGH (ref 70–99)

## 2013-08-15 NOTE — ED Notes (Addendum)
CBG 309 

## 2013-08-15 NOTE — ED Notes (Addendum)
Per patient he has been having trouble with urination for the past couple of weeks but pain woke him up at 6:45 this morning. States 10/10 pain in his bladder and genital area.

## 2013-08-15 NOTE — ED Notes (Signed)
Witnessed indwelling catheter placed by Edison Nasuti, Technician and patient appeared to be in immediate relief. Will continue to monitor.

## 2013-08-15 NOTE — ED Provider Notes (Signed)
CSN: [id number]     Arrival date & time 08/15/13  1202 History   First MD Initiated Contact with Patient 08/15/13 1248     Chief Complaint  Patient presents with  . Urinary Retention   (Consider location/radiation/quality/duration/timing/severity/associated sxs/prior Treatment) HPI Comments: Patient with history of benign prostatic hyperplasia presents with urinary retention. Patient has had difficulty urinating with a slow stream for the past 2-3 weeks. At 4:00 this morning he was no longer able to urinate. States he has had this problem before due to his enlarged prostate illness had had a Foley placed. He is followed by Dr Karsten Ro for this problem.  States that once the foley was placed he had complete relief.  Denies fevers, chills, abdominal pain, change in bowel habits, hematuria.   The history is provided by the patient.    Past Medical History  Diagnosis Date  . T2DM (type 2 diabetes mellitus) 2005  . Hyperlipidemia   . BPH (benign prostatic hyperplasia)   . Prostatitis   . Chronic kidney disease, stage II (mild)   . 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    Past Surgical History  Procedure Laterality Date  . Colonoscopy  2014    Dr. Benson Norway, was told to f/u in 2017-2019   Family History  Problem Relation Age of Onset  . Heart attack Father 3  . Colon cancer Sister 63  . Breast cancer Sister   . Colon cancer Sister 19   History  Substance Use Topics  . Smoking status: Former Smoker -- 1.00 packs/day for 10 years    Quit date: 01/28/1991  . Smokeless tobacco: Never Used  . Alcohol Use: No    Review of Systems  Constitutional: Negative for fever and chills.  Respiratory: Negative for shortness of breath.   Cardiovascular: Negative for chest pain.  Gastrointestinal: Negative for nausea, vomiting, abdominal pain, diarrhea and constipation.  Genitourinary: Positive for decreased urine volume.    Allergies   Review of patient's allergies indicates no known allergies.  Home Medications   Current Outpatient Rx  Name  Route  Sig  Dispense  Refill  . aspirin 325 MG tablet   Oral   Take 325 mg by mouth daily.         . hydrOXYzine (ATARAX/VISTARIL) 25 MG tablet   Oral   Take 25 mg by mouth 3 (three) times daily as needed for itching.         . Insulin Glargine (LANTUS SOLOSTAR) 100 UNIT/ML SOPN   Subcutaneous   Inject 10 Units into the skin every morning.         . insulin glargine (LANTUS) 100 UNIT/ML injection   Subcutaneous   Inject 10 Units into the skin every morning.         . Misc Natural Products (PROSTATE) CAPS   Oral   Take 1 capsule by mouth 3 (three) times daily.          . Multiple Vitamins-Minerals (MULTIVITAMIN WITH MINERALS) tablet   Oral   Take 1 tablet by mouth daily.         . tamsulosin (FLOMAX) 0.4 MG CAPS   Oral   Take 0.8 mg by mouth daily after breakfast.          . metFORMIN (GLUCOPHAGE) 1000 MG tablet   Oral   Take 1,000 mg by mouth 2 (two) times daily with a meal.  BP 160/76  Pulse 74  Temp(Src) 98.3 F (36.8 C) (Oral)  SpO2 98% Physical Exam  Nursing note and vitals reviewed. Constitutional: He appears well-developed and well-nourished. No distress.  HENT:  Head: Normocephalic and atraumatic.  Neck: Neck supple.  Cardiovascular: Normal rate and regular rhythm.   Pulmonary/Chest: Effort normal and breath sounds normal. No respiratory distress. He has no wheezes. He has no rales.  Abdominal: Soft. He exhibits no distension and no mass. There is no tenderness. There is no rebound and no guarding.  Genitourinary:  Foley catheter in place.  >1000cc clear yellow urine drained.   Per tech, 900cc drained immediately upon placement of foley.   Neurological: He is alert. He exhibits normal muscle tone.  Skin: He is not diaphoretic.    ED Course  Procedures (including critical care time) Labs Review Labs Reviewed   URINALYSIS, ROUTINE W REFLEX MICROSCOPIC - Abnormal; Notable for the following:    Glucose, UA >1000 (*)    Hgb urine dipstick MODERATE (*)    All other components within normal limits  GLUCOSE, CAPILLARY - Abnormal; Notable for the following:    Glucose-Capillary 309 (*)    All other components within normal limits  URINE CULTURE  URINE MICROSCOPIC-ADD ON   Imaging Review No results found.  MDM   1. Urinary retention   2. Hyperglycemia    Patient with chronic issue of enlarged prostate presents with gradually worsening urinary flow followed by several hours of urinary retention. He is otherwise feeling well. He did not take his diabetes medications this morning and has blood sugar is therefore very high. Pt intends to take his medication when he gets home. He clinically is not in DKA and has absolutely no symptoms now that the Foley catheter is in.  Pt advised to monitor his blood sugars carefully.  Strict return precautions.  Patient verbalizes understanding and agrees with plan.    I doubt any other EMC precluding discharge at this time including, but not necessarily limited to the following: DKA  Clayton Bibles, PA-C 08/15/13 1522

## 2013-08-15 NOTE — ED Provider Notes (Signed)
Medical screening examination/treatment/procedure(s) were performed by non-physician practitioner and as supervising physician I was immediately available for consultation/collaboration.  Leota Jacobsen, MD 08/15/13 2033

## 2013-08-16 LAB — URINE CULTURE
Colony Count: NO GROWTH
Special Requests: NORMAL

## 2013-08-18 ENCOUNTER — Telehealth: Payer: Self-pay | Admitting: Hematology and Oncology

## 2013-08-18 NOTE — Telephone Encounter (Signed)
Moved 9/12 appt to 9/19 due to schedule change. S/w wife re new d/t for 9/19.

## 2013-08-19 ENCOUNTER — Other Ambulatory Visit: Payer: Self-pay | Admitting: Urology

## 2013-08-20 ENCOUNTER — Ambulatory Visit: Payer: BC Managed Care – PPO

## 2013-08-23 ENCOUNTER — Encounter (HOSPITAL_COMMUNITY): Payer: Self-pay | Admitting: Pharmacy Technician

## 2013-08-25 ENCOUNTER — Other Ambulatory Visit: Payer: Self-pay | Admitting: Urology

## 2013-08-25 ENCOUNTER — Encounter (HOSPITAL_COMMUNITY)
Admission: RE | Admit: 2013-08-25 | Discharge: 2013-08-25 | Disposition: A | Discharge status: Home / Self Care | Payer: BC Managed Care – PPO | Source: Ambulatory Visit | Attending: Urology | Admitting: Urology

## 2013-08-25 ENCOUNTER — Encounter (HOSPITAL_COMMUNITY): Payer: Self-pay

## 2013-08-25 DIAGNOSIS — Z01810 Encounter for preprocedural cardiovascular examination: Secondary | ICD-10-CM | POA: Insufficient documentation

## 2013-08-25 DIAGNOSIS — Z01818 Encounter for other preprocedural examination: Secondary | ICD-10-CM | POA: Insufficient documentation

## 2013-08-25 DIAGNOSIS — Z01812 Encounter for preprocedural laboratory examination: Secondary | ICD-10-CM | POA: Insufficient documentation

## 2013-08-25 HISTORY — PX: BUNIONECTOMY: SHX129

## 2013-08-25 HISTORY — DX: Sickle-cell trait: D57.3

## 2013-08-25 LAB — CBC
HCT: 32.5 % — ABNORMAL LOW (ref 39.0–52.0)
Hemoglobin: 11.5 g/dL — ABNORMAL LOW (ref 13.0–17.0)
MCH: 30.4 pg (ref 26.0–34.0)
MCHC: 35.4 g/dL (ref 30.0–36.0)
MCV: 86 fL (ref 78.0–100.0)
Platelets: 209 10*3/uL (ref 150–400)
RBC: 3.78 MIL/uL — ABNORMAL LOW (ref 4.22–5.81)
RDW: 12.9 % (ref 11.5–15.5)
WBC: 5.1 10*3/uL (ref 4.0–10.5)

## 2013-08-25 LAB — BASIC METABOLIC PANEL
BUN: 27 mg/dL — ABNORMAL HIGH (ref 6–23)
CO2: 26 mEq/L (ref 19–32)
Calcium: 10.1 mg/dL (ref 8.4–10.5)
Chloride: 101 mEq/L (ref 96–112)
Creatinine, Ser: 1.31 mg/dL (ref 0.50–1.35)
GFR calc Af Amer: 67 mL/min — ABNORMAL LOW (ref >90)
GFR calc non Af Amer: 58 mL/min — ABNORMAL LOW (ref >90)
Glucose, Bld: 224 mg/dL — ABNORMAL HIGH (ref 70–99)
Potassium: 4.5 mEq/L (ref 3.5–5.1)
Sodium: 136 mEq/L (ref 135–145)

## 2013-08-25 NOTE — Patient Instructions (Addendum)
Nowthen  08/25/2013   Your procedure is scheduled on: 9-29  -2014  Report to [facility] at 0730       AM  Call this number if you have problems the morning of surgery: [phone]  Or Presurgical Testing [phone]([facility])      Do not eat food:After Midnight.    Take these medicines the morning of surgery with A SIP OF WATER: none. No Lantus or Diabetic meds AM of.   Do not wear jewelry, make-up or nail polish.  Do not wear lotions, powders, or perfumes. You may wear deodorant.  Do not shave 12 hours prior to first CHG shower(legs and under arms).(face and neck okay.)  Do not bring valuables to the hospital.  Contacts, dentures or bridgework,body piercing,  may not be worn into surgery.  Leave suitcase in the car. After surgery it may be brought to your room.  For patients admitted to the hospital, checkout time is 11:00 AM the day of discharge.   Patients discharged the day of surgery will not be allowed to drive home. Must have responsible person with you x 24 hours once discharged.  Name and phone number of your driver: Nobert Hammons [phone] cell  Special Instructions: CHG(Chlorhedine 4%-"Hibiclens","Betasept","Aplicare") Shower Use Special Wash: see special instructions.(avoid face and genitals)       Failure to follow these instructions may result in Cancellation of your surgery.   Patient signature_______________________________________________________

## 2013-08-25 NOTE — Pre-Procedure Instructions (Addendum)
08-25-13 EKG done today. Note to Dr. Karsten Ro -note labs viewable in [facility]. W. Floy Sabina

## 2013-08-25 NOTE — Progress Notes (Signed)
08-25-13 labs viewable in Epic-note please.

## 2013-08-27 ENCOUNTER — Ambulatory Visit: Payer: BC Managed Care – PPO | Admitting: Hematology and Oncology

## 2013-09-05 NOTE — H&P (Signed)
Richard Gill is a 57 year old male with AUR secondary to BPH.   History of Present Illness            Elevated PSA: Due to an elevated PSA of 5.10 he underwent TRUS/BX on 11/13/04 which revealed BPH and acute inflammation only. His prostate measured 57 cc at that time. In 10/9 his PSA was noted to be 8.72/15% and I recommended a repeat biopsy however he elected to proceed with PCA 3 at that time which revealed a low likelihood of prostate cancer. I recommended followup however the patient failed to return. In 2/14 his PSA was over 14.  This was thought to be falsely elevated due to infection.   BPH with LUTS: This is been present for some time and has been managed with Flomax.  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  Interval history: After developing urinary retention his tamsulosin was increased to b.i.d. After having failed voiding trials. He eventually was able to void and when he returned for followup PVR it was noted to be elevated at 210 cc.   Past Medical History Problems  1. History of  Diabetes Mellitus 250.00 2. History of  Hypercholesterolemia 272.0  Surgical History Problems  1. History of  Biopsy Of The Prostate Needle  Current Meds 1. Aspirin 81 MG Oral Tablet; Therapy: (Recorded:26Mar2008) to 2. Glimepiride TABS; Therapy: (Recorded:16Oct2009) to 3. Lantus SoloStar 100 UNIT/ML SOLN; Therapy: 08Oct2012 to 4. MetFORMIN HCl TABS; Therapy: (Recorded:26Mar2008) to 5. Simvastatin 40 MG Oral Tablet; Therapy: [id number] to 6. Tamsulosin HCl 0.4 MG Oral Capsule; TAKE TWO (2) CAPSULES BY MOUTH AT BEDTIME;  Therapy: [id number] to (Evaluate:28Feb2015)  Requested for: [id number]; Last [id number]  Allergies Medication  1. No Known Drug Allergies  Family History Problems  1. Paternal history of  Acute Myocardial Infarction V17.3 2. Family history of  Family Health Status Number Of Children 3 sons 5  daughters  Social History Problems  1. Caffeine Use 3-4 2. Marital History - Currently Married 3. Occupation: hardwood floors 4. History of  Tobacco Use V15.82 1 pack, 5-6 years Denied  5. Alcohol Use  Vitals Vital Signs  BMI Calculated: 21.27 BSA Calculated: 1.92 Height: 6 ft  Weight: 157 lb  Blood Pressure: 146 / 72 Heart Rate: 58  Review of Systems: Pertinent items are noted in HPI. A comprehensive review of systems was negative except as above.  Physical Exam: General appearance: alert and appears stated age Head: Normocephalic, without obvious abnormality, atraumatic Eyes: conjunctivae/corneas clear. EOM's intact.  Oropharynx: moist mucous membranes Neck: supple, symmetrical, trachea midline Resp: normal respiratory effort Cardio: regular rate and rhythm Back: symmetric, no curvature. ROM normal. No CVA tenderness. GI: soft, non-tender; bowel sounds normal; no masses,  no organomegaly Male genitalia: penis: normal male phallus with no lesions or discharge. Testes: bilaterally descended with no masses or tenderness. no hernias Pelvic: deferred Extremities: extremities normal, atraumatic, no cyanosis or edema Skin: Skin color normal. No visible rashes or lesions Neurologic: Grossly normal  Assessment  We discussed the fact that he has redeveloped urinary retention. I went over the options available. We could continue to try voiding trials but he has failed voiding trials and runs an elevated PVR and is at high risk for recurrent urinary retention. We therefore discussed minimally invasive procedure such as prostatic microwave and I went over the durability of this procedure. We also discussed transurethral resection of the prostate I went over this procedure with him  in detail. We discussed how the procedure was performed, the risks and complications including that of retrograde ejaculation, incontinence and impotence. We discussed the anticipated hospital and  posthospital course as well as the probability of success. He understands and has elected to proceed.  He has a Foley catheter in now. I will obtain a catheterized specimen from the a bladder catheter today for culture and treat preoperatively with antibiotics if positive.   Plan   1.  I obtained a catheterized specimen for urine culture which was found to be negative.  2. He will be scheduled for TURP. 3. I have asked him to stop taking his daily aspirin.

## 2013-09-06 ENCOUNTER — Ambulatory Visit (HOSPITAL_COMMUNITY): Payer: BC Managed Care – PPO | Admitting: Certified Registered Nurse Anesthetist

## 2013-09-06 ENCOUNTER — Encounter (HOSPITAL_COMMUNITY): Payer: Self-pay | Admitting: Certified Registered Nurse Anesthetist

## 2013-09-06 ENCOUNTER — Ambulatory Visit (HOSPITAL_COMMUNITY)
Admission: RE | Admit: 2013-09-06 | Discharge: 2013-09-07 | Disposition: A | Discharge status: Home / Self Care | Payer: BC Managed Care – PPO | Source: Ambulatory Visit | Attending: Urology | Admitting: Urology

## 2013-09-06 ENCOUNTER — Encounter (HOSPITAL_COMMUNITY)
Admission: RE | Disposition: A | Discharge status: Home / Self Care | Payer: Self-pay | Source: Ambulatory Visit | Attending: Urology

## 2013-09-06 DIAGNOSIS — N401 Enlarged prostate with lower urinary tract symptoms: Secondary | ICD-10-CM | POA: Insufficient documentation

## 2013-09-06 DIAGNOSIS — N529 Male erectile dysfunction, unspecified: Secondary | ICD-10-CM | POA: Insufficient documentation

## 2013-09-06 DIAGNOSIS — N400 Enlarged prostate without lower urinary tract symptoms: Secondary | ICD-10-CM

## 2013-09-06 DIAGNOSIS — Z79899 Other long term (current) drug therapy: Secondary | ICD-10-CM | POA: Insufficient documentation

## 2013-09-06 DIAGNOSIS — E109 Type 1 diabetes mellitus without complications: Secondary | ICD-10-CM | POA: Insufficient documentation

## 2013-09-06 DIAGNOSIS — N138 Other obstructive and reflux uropathy: Secondary | ICD-10-CM | POA: Insufficient documentation

## 2013-09-06 DIAGNOSIS — R339 Retention of urine, unspecified: Secondary | ICD-10-CM | POA: Insufficient documentation

## 2013-09-06 DIAGNOSIS — R112 Nausea with vomiting, unspecified: Secondary | ICD-10-CM | POA: Insufficient documentation

## 2013-09-06 DIAGNOSIS — Z7982 Long term (current) use of aspirin: Secondary | ICD-10-CM | POA: Insufficient documentation

## 2013-09-06 DIAGNOSIS — E7800 Pure hypercholesterolemia, unspecified: Secondary | ICD-10-CM | POA: Insufficient documentation

## 2013-09-06 DIAGNOSIS — N4289 Other specified disorders of prostate: Secondary | ICD-10-CM | POA: Insufficient documentation

## 2013-09-06 HISTORY — PX: TRANSURETHRAL RESECTION OF PROSTATE: SHX73

## 2013-09-06 LAB — GLUCOSE, CAPILLARY
Glucose-Capillary: 285 mg/dL — ABNORMAL HIGH (ref 70–99)
Glucose-Capillary: 241 mg/dL — ABNORMAL HIGH (ref 70–99)
Glucose-Capillary: 182 mg/dL — ABNORMAL HIGH (ref 70–99)
Glucose-Capillary: 237 mg/dL — ABNORMAL HIGH (ref 70–99)
Glucose-Capillary: 281 mg/dL — ABNORMAL HIGH (ref 70–99)

## 2013-09-06 LAB — HEMOGLOBIN AND HEMATOCRIT, BLOOD
HCT: 26.2 % — ABNORMAL LOW (ref 39.0–52.0)
Hemoglobin: 9.4 g/dL — ABNORMAL LOW (ref 13.0–17.0)

## 2013-09-06 SURGERY — TRANSURETHRAL RESECTION OF THE PROSTATE WITH GYRUS INSTRUMENTS
Anesthesia: General | Wound class: Clean Contaminated

## 2013-09-06 MED ORDER — BACITRACIN-NEOMYCIN-POLYMYXIN 400-5-5000 EX OINT: 1.0000 "application " | TOPICAL_OINTMENT | Freq: Three times a day (TID) | CUTANEOUS | Status: DC | PRN

## 2013-09-06 MED ORDER — MIDAZOLAM HCL 5 MG/5ML IJ SOLN
INTRAMUSCULAR | Status: DC | PRN
  Administered 2013-09-06: 2 mg via INTRAVENOUS

## 2013-09-06 MED ORDER — SODIUM CHLORIDE 0.9 % IR SOLN
3000.0000 mL | Status: AC
  Administered 2013-09-06 – 2013-09-07 (×4): 3000 mL

## 2013-09-06 MED ORDER — ZOLPIDEM TARTRATE 5 MG PO TABS: 5.0000 mg | ORAL_TABLET | Freq: Every evening | ORAL | Status: DC | PRN

## 2013-09-06 MED ORDER — CIPROFLOXACIN IN D5W 200 MG/100ML IV SOLN: 200.0000 mg | Freq: Two times a day (BID) | INTRAVENOUS | Status: DC

## 2013-09-06 MED ORDER — PROMETHAZINE HCL 25 MG/ML IJ SOLN
25.0000 mg | Freq: Once | INTRAMUSCULAR | Status: AC
  Administered 2013-09-06: 25 mg via INTRAVENOUS
  Filled 2013-09-06: qty 1

## 2013-09-06 MED ORDER — HYDROMORPHONE HCL PF 1 MG/ML IJ SOLN
INTRAMUSCULAR | Status: AC
  Filled 2013-09-06: qty 1

## 2013-09-06 MED ORDER — ONDANSETRON HCL 4 MG/2ML IJ SOLN
INTRAMUSCULAR | Status: DC | PRN
  Administered 2013-09-06: 4 mg via INTRAVENOUS

## 2013-09-06 MED ORDER — PROPOFOL 10 MG/ML IV BOLUS
INTRAVENOUS | Status: DC | PRN
  Administered 2013-09-06: 180 mg via INTRAVENOUS

## 2013-09-06 MED ORDER — BELLADONNA ALKALOIDS-OPIUM 16.2-60 MG RE SUPP: 1.0000 | Freq: Four times a day (QID) | RECTAL | Status: DC | PRN

## 2013-09-06 MED ORDER — LIDOCAINE HCL (CARDIAC) 20 MG/ML IV SOLN
INTRAVENOUS | Status: DC | PRN
  Administered 2013-09-06: 50 mg via INTRAVENOUS

## 2013-09-06 MED ORDER — LACTATED RINGERS IV SOLN
INTRAVENOUS | Status: DC | PRN
  Administered 2013-09-06 (×3): via INTRAVENOUS

## 2013-09-06 MED ORDER — FENTANYL CITRATE 0.05 MG/ML IJ SOLN
INTRAMUSCULAR | Status: DC | PRN
  Administered 2013-09-06 (×6): 50 ug via INTRAVENOUS

## 2013-09-06 MED ORDER — HYDROCODONE-ACETAMINOPHEN 5-325 MG PO TABS: 1.0000 | ORAL_TABLET | ORAL | Status: DC | PRN

## 2013-09-06 MED ORDER — SODIUM CHLORIDE 0.9 % IR SOLN
Status: DC | PRN
  Administered 2013-09-06: 24000 mL

## 2013-09-06 MED ORDER — CIPROFLOXACIN IN D5W 400 MG/200ML IV SOLN
400.0000 mg | Freq: Two times a day (BID) | INTRAVENOUS | Status: DC
  Administered 2013-09-06: 400 mg via INTRAVENOUS

## 2013-09-06 MED ORDER — 0.9 % SODIUM CHLORIDE (POUR BTL) OPTIME
TOPICAL | Status: DC | PRN
  Administered 2013-09-06: 1000 mL

## 2013-09-06 MED ORDER — SODIUM CHLORIDE 0.9 % IV SOLN
INTRAVENOUS | Status: DC
  Administered 2013-09-06: 100 mL/h via INTRAVENOUS
  Administered 2013-09-07: 01:00:00 via INTRAVENOUS

## 2013-09-06 MED ORDER — HYDROMORPHONE HCL PF 1 MG/ML IJ SOLN
0.2500 mg | INTRAMUSCULAR | Status: DC | PRN
  Administered 2013-09-06 (×4): 0.5 mg via INTRAVENOUS

## 2013-09-06 MED ORDER — PHENYLEPHRINE HCL 10 MG/ML IJ SOLN
INTRAMUSCULAR | Status: DC | PRN
  Administered 2013-09-06 (×2): 40 ug via INTRAVENOUS
  Administered 2013-09-06: 80 ug via INTRAVENOUS

## 2013-09-06 MED ORDER — ONDANSETRON HCL 4 MG/2ML IJ SOLN
4.0000 mg | INTRAMUSCULAR | Status: DC | PRN
  Administered 2013-09-06: 4 mg via INTRAVENOUS
  Filled 2013-09-06: qty 2

## 2013-09-06 MED ORDER — LACTATED RINGERS IV SOLN: INTRAVENOUS | Status: DC

## 2013-09-06 MED ORDER — INSULIN ASPART 100 UNIT/ML ~~LOC~~ SOLN
0.0000 [IU] | SUBCUTANEOUS | Status: DC
  Administered 2013-09-06: 8 [IU] via SUBCUTANEOUS
  Filled 2013-09-06: qty 1

## 2013-09-06 MED ORDER — ACETAMINOPHEN 325 MG PO TABS: 650.0000 mg | ORAL_TABLET | ORAL | Status: DC | PRN

## 2013-09-06 MED ORDER — CIPROFLOXACIN IN D5W 400 MG/200ML IV SOLN
400.0000 mg | Freq: Two times a day (BID) | INTRAVENOUS | Status: DC
  Administered 2013-09-06: 400 mg via INTRAVENOUS
  Filled 2013-09-06 (×3): qty 200

## 2013-09-06 MED ORDER — INSULIN ASPART 100 UNIT/ML ~~LOC~~ SOLN
0.0000 [IU] | SUBCUTANEOUS | Status: DC
  Administered 2013-09-06: 5 [IU] via SUBCUTANEOUS
  Administered 2013-09-06: 8 [IU] via SUBCUTANEOUS
  Administered 2013-09-07: 5 [IU] via SUBCUTANEOUS
  Administered 2013-09-07: 3 [IU] via SUBCUTANEOUS

## 2013-09-06 MED ORDER — ACETAMINOPHEN 500 MG PO TABS
1000.0000 mg | ORAL_TABLET | Freq: Four times a day (QID) | ORAL | Status: AC
  Administered 2013-09-06 – 2013-09-07 (×4): 1000 mg via ORAL
  Filled 2013-09-06 (×4): qty 2

## 2013-09-06 MED ORDER — PROMETHAZINE HCL 25 MG/ML IJ SOLN: 6.2500 mg | INTRAMUSCULAR | Status: DC | PRN

## 2013-09-06 MED ORDER — CIPROFLOXACIN IN D5W 400 MG/200ML IV SOLN
INTRAVENOUS | Status: AC
  Filled 2013-09-06: qty 200

## 2013-09-06 SURGICAL SUPPLY — 27 items
BAG URINE DRAINAGE (UROLOGICAL SUPPLIES) ×2 IMPLANT
BAG URO CATCHER STRL LF (DRAPE) ×2 IMPLANT
BLADE SURG 15 STRL LF DISP TIS (BLADE) IMPLANT
BLADE SURG 15 STRL SS (BLADE)
CATH FOLEY 3WAY 30CC 24FR (CATHETERS) ×1
CATH URTH STD 24FR FL 3W 2 (CATHETERS) ×1 IMPLANT
CLOTH BEACON ORANGE TIMEOUT ST (SAFETY) ×2 IMPLANT
DRAPE CAMERA CLOSED 9X96 (DRAPES) ×2 IMPLANT
ELECT LOOP MED HF 24F 12D CBL (CLIP) ×2 IMPLANT
ELECT REM PT RETURN 9FT ADLT (ELECTROSURGICAL)
ELECTRODE REM PT RTRN 9FT ADLT (ELECTROSURGICAL) IMPLANT
EVACUATOR MICROVAS BLADDER (UROLOGICAL SUPPLIES) ×2 IMPLANT
GLOVE BIOGEL M 8.0 STRL (GLOVE) ×2 IMPLANT
GOWN PREVENTION PLUS XLARGE (GOWN DISPOSABLE) IMPLANT
GOWN STRL REIN XL XLG (GOWN DISPOSABLE) ×4 IMPLANT
IV NS IRRIG 3000ML ARTHROMATIC (IV SOLUTION) IMPLANT
JUMPSUIT BLUE BOOT COVER DISP (PROTECTIVE WEAR) IMPLANT
KIT ASPIRATION TUBING (SET/KITS/TRAYS/PACK) ×2 IMPLANT
LOOPS RESECTOSCOPE DISP (ELECTROSURGICAL) IMPLANT
MANIFOLD NEPTUNE II (INSTRUMENTS) ×2 IMPLANT
NS IRRIG 1000ML POUR BTL (IV SOLUTION) IMPLANT
PACK CYSTO (CUSTOM PROCEDURE TRAY) ×2 IMPLANT
SUT ETHILON 3 0 PS 1 (SUTURE) IMPLANT
SYR 30ML LL (SYRINGE) ×2 IMPLANT
SYRINGE IRR TOOMEY STRL 70CC (SYRINGE) ×2 IMPLANT
TUBING CONNECTING 10 (TUBING) ×2 IMPLANT
WIRE COONS/BENSON .038X145CM (WIRE) IMPLANT

## 2013-09-06 NOTE — Transfer of Care (Signed)
Immediate Anesthesia Transfer of Care Note  Patient: Richard Gill.  Procedure(s) Performed: Procedure(s): TRANSURETHRAL RESECTION OF THE PROSTATE WITH GYRUS INSTRUMENTS (N/A)  Patient Location: PACU  Anesthesia Type:General  Level of Consciousness: awake and alert   Airway & Oxygen Therapy: Patient Spontanous Breathing and Patient connected to face mask oxygen  Post-op Assessment: Report given to PACU RN and Post -op Vital signs reviewed and stable  Post vital signs: Reviewed and stable  Complications: No apparent anesthesia complications

## 2013-09-06 NOTE — Progress Notes (Signed)
Patient ID: Josafat Fickert., male   DOB: 1956-11-14, 57 y.o.   MRN: [id number]  He was having nausea and reported having vomiting earlier.  He was not having any abdominal pain.  He has not had difficulty with anesthetic nausea previously.  He was noted to be afebrile with stable vital signs.  His abdomen was soft and nontender. A Foley catheter was on traction and he was on CBI with nearly clear urine and no clots.  He appears to be doing well postoperatively.  He did have a drop in his hemoglobin but not as low as it was 6 months ago.  He did not require transfusion at that time.  I therefore will recheck his hemoglobin again in the morning.  He otherwise is to be doing well and there appears to be no active bleeding at this time.  His decrease in hemoglobin is obviously from intraoperative blood loss.  1.  Continue CBI. 2.  I will maintain him on catheter traction until the morning. 3.  Recheck H&H in the morning. 4.  Anticipate discharge in the morning.

## 2013-09-06 NOTE — Anesthesia Postprocedure Evaluation (Signed)
Anesthesia Post Note  Patient: Richard Gill.  Procedure(s) Performed: Procedure(s) (LRB): TRANSURETHRAL RESECTION OF THE PROSTATE WITH GYRUS INSTRUMENTS (N/A)  Anesthesia type: General  Patient location: PACU  Post pain: Pain level controlled  Post assessment: Post-op Vital signs reviewed  Last Vitals:  Filed Vitals:   09/06/13 1236  BP: 126/67  Pulse: 63  Temp: 36.7 C  Resp: 14    Post vital signs: Reviewed  Level of consciousness: sedated  Complications: No apparent anesthesia complications

## 2013-09-06 NOTE — Op Note (Signed)
PATIENT:  Richard Gill.  PRE-OPERATIVE DIAGNOSIS: BPH with outlet obstruction  POST-OPERATIVE DIAGNOSIS: Same  PROCEDURE: TURP  SURGEON:  [name]  INDICATION: Richard Gill. is a 57 year old male who developed urinary retention and despite attempts at maximum medical management has failed several voiding trials. He is brought to the operating room today for transurethral resection of his prostate.  ANESTHESIA:  General  EBL:  ~500 cc  DRAINS: 24 French, 30 cc three-way Foley  LOCAL MEDICATIONS USED:  None  SPECIMEN:  Prostate chips to pathology  Description of procedure: After informed consent the patient was brought to the major or, placed on the table and administered general anesthesia. He was then moved to the dorsal lithotomy position and his genitalia was sterilely prepped and draped. An official timeout was then performed.  The 26 French resectoscope sheath with Timberlake obturator was then introduced into the bladder and the obturator removed and replaced with the resectoscope element and 12 lens. The bladder was fully and systematically inspected. It was noted be free of any tumors, stones or inflammatory lesions. Ureteral orifices were identified and noted to be well away from the bladder neck. It was minimal median lobe tissue however there was obstructing lateral lobes with an elongated prostatic urethra.  I began by resecting at the 6:00 position from the bladder neck back to the veru. I then resected the left lobe in a counterclockwise direction resecting down to the surgical capsule. I then resected the right lobe in an identical fashion and fulgurated bleeding points as they were identified. I then resected the apical tissue with care being taken to remain proximal to the veru at all times.  I used the Microvasive evacuator to remove all prostatic chips from the bladder and then reinspected the bladder and noted no perforation, prostatic chips and intact  ureteral orifices bilaterally. The resectoscope was then removed and the Foley catheter inserted and connected to closed system drainage as well as continuous bladder irrigation. The bladder was irrigated and the irrigant returned light pink. The catheter was placed on mild traction. The patient was then awakened and taken recovery room in stable and satisfactory condition. He tolerated the procedure well and there were no intraoperative complications.

## 2013-09-06 NOTE — Progress Notes (Signed)
Upon arrival pt stated he had a reaction to an antibiotic prescribed by Dr Karsten Ro last week and called an got another antibiotic but he was unsure of either name of medication. I called office and confirmed this with Dr Gaye Alken nurse and updated his med list and allergy list. Also spoke with Dr Winfred Leeds about pt CBG of 285. Orders received and pt received insulin SQ as per sliding scale

## 2013-09-06 NOTE — Anesthesia Preprocedure Evaluation (Signed)
Anesthesia Evaluation  Patient identified by MRN, date of birth, ID band Patient awake    Reviewed: Allergy & Precautions, H&P , NPO status , Patient's Chart, lab work & pertinent test results  Airway Mallampati: II TM Distance: >3 FB Neck ROM: Full    Dental  (+) Teeth Intact and Dental Advisory Given   Pulmonary neg pulmonary ROS,  breath sounds clear to auscultation  Pulmonary exam normal       Cardiovascular hypertension, Rhythm:Regular Rate:Normal     Neuro/Psych negative neurological ROS  negative psych ROS   GI/Hepatic negative GI ROS, Neg liver ROS,   Endo/Other  diabetes, Poorly Controlled, Type 1, Insulin Dependent and Oral Hypoglycemic Agents  Renal/GU Renal disease  negative genitourinary   Musculoskeletal negative musculoskeletal ROS (+)   Abdominal   Peds  Hematology negative hematology ROS (+)   Anesthesia Other Findings   Reproductive/Obstetrics                           Anesthesia Physical Anesthesia Plan  ASA: III  Anesthesia Plan: General   Post-op Pain Management:    Induction: Intravenous  Airway Management Planned: LMA  Additional Equipment:   Intra-op Plan:   Post-operative Plan: Extubation in OR  Informed Consent: I have reviewed the patients History and Physical, chart, labs and discussed the procedure including the risks, benefits and alternatives for the proposed anesthesia with the patient or authorized representative who has indicated his/her understanding and acceptance.   Dental advisory given  Plan Discussed with: CRNA  Anesthesia Plan Comments:         Anesthesia Quick Evaluation

## 2013-09-06 NOTE — Interval H&P Note (Signed)
History and Physical Interval Note:  09/06/2013 9:28 AM  Richard Gill.  has presented today for surgery, with the diagnosis of Urinary Retention  The various methods of treatment have been discussed with the patient and family. After consideration of risks, benefits and other options for treatment, the patient has consented to  Procedure(s): TRANSURETHRAL RESECTION OF THE PROSTATE WITH GYRUS INSTRUMENTS (N/A) as a surgical intervention .  The patient's history has been reviewed, patient examined, no change in status, stable for surgery.  I have reviewed the patient's chart and labs.  Questions were answered to the patient's satisfaction.     [name]

## 2013-09-07 ENCOUNTER — Encounter (HOSPITAL_COMMUNITY): Payer: Self-pay | Admitting: Urology

## 2013-09-07 LAB — HEMOGLOBIN AND HEMATOCRIT, BLOOD
HCT: 23.9 % — ABNORMAL LOW (ref 39.0–52.0)
Hemoglobin: 8.4 g/dL — ABNORMAL LOW (ref 13.0–17.0)

## 2013-09-07 LAB — GLUCOSE, CAPILLARY
Glucose-Capillary: 187 mg/dL — ABNORMAL HIGH (ref 70–99)
Glucose-Capillary: 245 mg/dL — ABNORMAL HIGH (ref 70–99)
Glucose-Capillary: 71 mg/dL (ref 70–99)

## 2013-09-07 MED ORDER — CIPROFLOXACIN HCL 500 MG PO TABS: 500.0000 mg | ORAL_TABLET | Freq: Two times a day (BID) | ORAL | Status: DC

## 2013-09-07 MED ORDER — HYDROCODONE-ACETAMINOPHEN 10-325 MG PO TABS: 1.0000 | ORAL_TABLET | ORAL | Status: DC | PRN

## 2013-09-07 MED ORDER — PHENAZOPYRIDINE HCL 200 MG PO TABS: 200.0000 mg | ORAL_TABLET | Freq: Three times a day (TID) | ORAL | Status: DC | PRN

## 2013-09-07 NOTE — Progress Notes (Signed)
Discharge to home, wife at bedside. D/c instructions and follow p appointments done and was given to the patient and wife, verbalized understanding. PIV removed no s/s of infiltration or swelling noted.

## 2013-09-07 NOTE — Progress Notes (Signed)
Patient complained of lower abdominal pain,distended and  hard to palpate. Bladder scan done retaining 886cc. Called Md's office spoke to Lurline Hare. MD ordered to insert [facility] , office will contact patient to do voiding trials. Draining dark red urine, patient verbalized relief.

## 2013-09-07 NOTE — Discharge Summary (Signed)
Physician Discharge Summary  Patient ID: Richard Gill. MRN: [id number] DOB/AGE: 06-17-1956 57 y.o.  Admit date: 09/06/2013 Discharge date: 09/07/2013  Admission Diagnoses: 1. BPH with outlet obstruction 2. Urinary retention  Discharge Diagnoses:  Same as above  Discharged Condition: good  Hospital Course: Mr. Richard Gill was admitted for elective transurethral resection of his prostate do to outlet obstruction resulting in urinary retention. His surgery was uneventful but there was some degree of blood loss. Postoperatively he was noted to have some nausea and vomiting. That was controlled with antiemetics. He remained stable throughout the night and his urine cleared. His Foley catheter was removed and he began voiding spontaneously. His hemoglobin postoperatively had decreased to 9.4. He was not experiencing any symptoms. His urine is now cleared and his followup hemoglobin was found to be 8.4 felt to be due to acute blood loss and not do to ongoing bleeding. It was therefore felt that he was ready for discharge.  Consults: None  Significant Diagnostic Studies: labs: As above  Treatments: surgery: TURP  Discharge Exam: Blood pressure 100/63, pulse 71, temperature 98.1 F (36.7 C), temperature source Oral, resp. rate 18, height 5\' 9"  (1.753 m), weight 71.5 kg (157 lb 10.1 oz), SpO2 100.00%. General appearance: alert, cooperative and no distress GI: soft, non-tender; bowel sounds normal; no masses,  no organomegaly  Disposition: 01-Home or Self Care  Discharge Orders   Future Orders Complete By Expires   Discharge patient  As directed        Medication List    TAKE these medications       ciprofloxacin 500 MG tablet  Commonly known as:  CIPRO  Take 1 tablet (500 mg total) by mouth 2 (two) times daily.     HYDROcodone-acetaminophen 10-325 MG per tablet  Commonly known as:  NORCO  Take 1-2 tablets by mouth every 4 (four) hours as needed for pain.     phenazopyridine 200 MG  tablet  Commonly known as:  PYRIDIUM  Take 1 tablet (200 mg total) by mouth 3 (three) times daily as needed for pain.      ASK your doctor about these medications       aspirin 325 MG tablet  Take 325 mg by mouth daily.     hydrOXYzine 25 MG tablet  Commonly known as:  ATARAX/VISTARIL  Take 25 mg by mouth 3 (three) times daily as needed for itching.     LANTUS SOLOSTAR 100 UNIT/ML Sopn  Generic drug:  Insulin Glargine  Inject 10 Units into the skin every morning.     metFORMIN 1000 MG tablet  Commonly known as:  GLUCOPHAGE  Take 1,000 mg by mouth 2 (two) times daily with a meal.     multivitamin with minerals tablet  Take 1 tablet by mouth daily.     PROSTATE Caps  Take 1 capsule by mouth 3 (three) times daily.     tamsulosin 0.4 MG Caps capsule  Commonly known as:  FLOMAX  Take 0.8 mg by mouth daily after breakfast.           Follow-up Information   Follow up with [name], MD On 09/13/2013. (at 9:15)    Specialty:  Urology   Contact information:   [place] [address] [phone]       Signed: [name] 09/07/2013, 6:48 AM

## 2013-09-07 NOTE — Progress Notes (Signed)
Patient is already discharged at this time.

## 2013-11-10 ENCOUNTER — Other Ambulatory Visit: Payer: Self-pay | Admitting: Family Medicine

## 2013-11-10 MED ORDER — INSULIN PEN NEEDLE 31G X 6 MM MISC: Status: DC

## 2013-11-29 ENCOUNTER — Ambulatory Visit (INDEPENDENT_AMBULATORY_CARE_PROVIDER_SITE_OTHER): Payer: BC Managed Care – PPO | Admitting: Family Medicine

## 2013-11-29 ENCOUNTER — Encounter: Payer: Self-pay | Admitting: Family Medicine

## 2013-11-29 VITALS — BP 134/80 | HR 58 | Temp 98.4°F | Wt 156.0 lb

## 2013-11-29 DIAGNOSIS — M25562 Pain in left knee: Secondary | ICD-10-CM

## 2013-11-29 DIAGNOSIS — E119 Type 2 diabetes mellitus without complications: Secondary | ICD-10-CM

## 2013-11-29 DIAGNOSIS — E785 Hyperlipidemia, unspecified: Secondary | ICD-10-CM

## 2013-11-29 DIAGNOSIS — M25569 Pain in unspecified knee: Secondary | ICD-10-CM

## 2013-11-29 LAB — LIPID PANEL
HDL: 35 mg/dL — ABNORMAL LOW (ref >39)
LDL Cholesterol: 202 mg/dL — ABNORMAL HIGH (ref 0–99)

## 2013-11-29 MED ORDER — METFORMIN HCL 1000 MG PO TABS: 1000.0000 mg | ORAL_TABLET | Freq: Two times a day (BID) | ORAL | Status: DC

## 2013-11-29 MED ORDER — GLUCOSE BLOOD VI STRP: ORAL_STRIP | Status: DC

## 2013-11-29 NOTE — Patient Instructions (Signed)
It was nice to meet you today!  Your diabetes number is elevated. Work on diet (see below). We'll recheck it in 3 months. If it is still elevated at that time, we will need to talk about adding another type of insulin at mealtimes.  For your knee, try icing it and tylenol. Return if still hurting in 2-3 weeks and we can talk about getting an xray.  Return in 3 months for diabetes. I will call you if your test results are not normal.  Otherwise, I will send you a letter.  If you do not hear from me with in 2 weeks please call our office.      Be well, Dr. Ardelia Mems

## 2013-11-29 NOTE — Assessment & Plan Note (Addendum)
A1c greatly increased above where it has been in the past (9.8 today, from 7.4 seven months ago). Pt attributes this to increased PO intake from trying to gain weight and would like to work on diet and lifestyle prior to adding mealtime insulin. Also potentially contributing is that he has been out of metformin for 1-2 months. Will refill this today. He will follow up in 3 months to recheck A1c. If it remains elevated at that time, will likely add on novolog mealtime coverage.  Cardiac: not on statin (states has been intolerant in the past), will check fasting lipid panel today. Also not on aspirin, consider addressing this at next office visit. Renal: known CKD, not on an ACE/ARB. Will check urine microalbumin today. Likely needs ACE inhibitor, unclear why he isn't on this already. Eye: had eye exam here in May, needs f/u exam in May 2015 Foot: normal foot exam today, next due in December 2015.

## 2013-11-29 NOTE — Progress Notes (Signed)
Patient ID: Richard Chau., male   DOB: 01-13-1956, 57 y.o.   MRN: [id number]  HPI:  Diabetes: Currently taking Lantus 14 units daily. Has been out of metformin for 1-2 months but previously was taking 1000mg  BID. He checks his CBG's at home and typically gets between 75 up to the 300's. The lowest he gets is when he gets home from work, in the 43's. His last eye exam was in May. He's due for a foot exam today. Denies chest pain, shortness of breath, dizziness, or lightheadedness. He has been eating a lot lately due to trying to gain weight.  Hyperlipidemia: not taking any lipid modifying agents currently, states has been intolerant of statin in the past. See above ROS.  L Knee pain: has been hurting him for 2 weeks. It initially started hurting when he worked on his knee via kneeling down on it for an extended period of time. His knee gets swollen by the end of the day. He denies any redness of the knee or fevers. He has not tried any treatments for the pain except for resting the knee. He has never had this problem before.   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 Full strength with knee flexion and extension, and hip flexion. Some crepitus present with flexion and extension. Mild tenderness over medial aspect of knee along the joint line. 2+ DP pulses bilaterally. No sores or wounds on feet. Normal monofilament exam bilaterally.  ASSESSMENT/PLAN:  # Knee pain: likely some component of osteoarthritis, exacerbated by recent overuse/kneeling on knee. Will start with conservative treatment with tylenol and ice (note pt not candidate for NSAID's due to CKD). F/u in 2-3 weeks if not improved, at which point we could consider  obtaining plain films.  # Health maintenance: -Offered flu shot to patient today, but pt declined.   See problem based charting for additional assessment/plan.  FOLLOW UP: F/u in 3 months for diabetes, f/u in 2-3 weeks for knee pain if not improved.  [place]. Ardelia Mems, [place]

## 2013-11-29 NOTE — Assessment & Plan Note (Signed)
Check lipids today 

## 2013-11-30 LAB — MICROALBUMIN / CREATININE URINE RATIO
Creatinine, Urine: 79.2 mg/dL
Microalb, Ur: 7.04 mg/dL — ABNORMAL HIGH (ref 0.00–1.89)

## 2013-12-06 ENCOUNTER — Other Ambulatory Visit: Payer: Self-pay | Admitting: Family Medicine

## 2013-12-07 ENCOUNTER — Telehealth: Payer: Self-pay | Admitting: Family Medicine

## 2013-12-07 DIAGNOSIS — E119 Type 2 diabetes mellitus without complications: Secondary | ICD-10-CM

## 2013-12-07 NOTE — Telephone Encounter (Signed)
Attempted to reach patient regarding his test results. Pt did not answer. Left generic voicemail asking him to return the call.   The amount of protein in his urine and his cholesterol suggest that he should be on three new medications:  1. Lisinopril - to protect his kidneys 2. Atorvastatin - to lower his cholesterol 3. Aspirin - to reduce his risk of cardiovascular disease.  Please message me back in epic whenever he returns the call, as I would like to speak with him about needing these new medications. I also need to clarify what blood sugar meter he is using in order to correctly order his test strips.  Leeanne Rio, MD

## 2013-12-08 MED ORDER — ASPIRIN EC 81 MG PO TBEC: 81.0000 mg | DELAYED_RELEASE_TABLET | Freq: Every day | ORAL | Status: DC

## 2013-12-08 MED ORDER — ROSUVASTATIN CALCIUM 20 MG PO TABS: 20.0000 mg | ORAL_TABLET | Freq: Every day | ORAL | Status: DC

## 2013-12-08 MED ORDER — INSULIN GLARGINE 100 UNIT/ML SOLOSTAR PEN: 10.0000 [IU] | PEN_INJECTOR | Freq: Every morning | SUBCUTANEOUS | Status: DC

## 2013-12-08 MED ORDER — LISINOPRIL 5 MG PO TABS: 5.0000 mg | ORAL_TABLET | Freq: Every day | ORAL | Status: DC

## 2013-12-08 NOTE — Telephone Encounter (Signed)
Informed patient of message from MD, he would like to speak with MD as he is hesitant to start anything for cholesterol since it gave him such bad muscle/joint pain before. Also patient says he uses the Micro brand test strips BID, will forward message to PCP.

## 2013-12-08 NOTE — Telephone Encounter (Addendum)
Returned call to patient to discuss with him his results. Explained need for statin, aspirin, and lisinopril based on his cardiovascular risk and renal function. Will send in prescriptions. He is hesitant about statin because he did not tolerate simvastatin or lipitor in the past. Advised that we would try crestor to see if he tolerates it. Also clarified the type of strips he needs (relion micro mini) and will submit prior authorization form for his insurance. He has been getting lower sugars since restarting his metformin (70s-80s) so we will cut back his lantus to 10 units daily. I will send in a new refill for him.  I scheduled a lab appointment for him to return in 1 week to get his BMET repeated since we're starting lisinopril. He will come to this appt.  Leeanne Rio, MD

## 2013-12-15 ENCOUNTER — Other Ambulatory Visit: Payer: BC Managed Care – PPO

## 2013-12-15 ENCOUNTER — Encounter: Payer: Self-pay | Admitting: Family Medicine

## 2013-12-15 ENCOUNTER — Telehealth: Payer: Self-pay | Admitting: Family Medicine

## 2013-12-15 DIAGNOSIS — E119 Type 2 diabetes mellitus without complications: Secondary | ICD-10-CM

## 2013-12-15 LAB — BASIC METABOLIC PANEL
BUN: 16 mg/dL (ref 6–23)
CALCIUM: 9 mg/dL (ref 8.4–10.5)
CO2: 28 meq/L (ref 19–32)
Chloride: 104 mEq/L (ref 96–112)
Creat: 1.27 mg/dL (ref 0.50–1.35)
GLUCOSE: 145 mg/dL — AB (ref 70–99)
Potassium: 4.3 mEq/L (ref 3.5–5.3)
SODIUM: 139 meq/L (ref 135–145)

## 2013-12-15 NOTE — Telephone Encounter (Signed)
[place] red team, I had tried to get pt prior auth from [facility] for new test strips, but got a notice from [facility] that he does not have pharmacy benefits with [employer]. Can you contact him and find out who his pharmacy benefits are through and obtain the appropriate prior auth form for me to order his test strips?  Thanks, Leeanne Rio, MD

## 2013-12-15 NOTE — Telephone Encounter (Signed)
Okay, will send letter asking him to call us with info about pharmacy benefit provider and his updated phone #. Leeanne Rio, MD

## 2013-12-15 NOTE — Telephone Encounter (Signed)
Pt's phone is disconnected.  Lodema Parma, Loralyn Fresh[place], [place]

## 2013-12-15 NOTE — Progress Notes (Signed)
BMP DONE TODAY Alayjah Boehringer 

## 2014-01-05 ENCOUNTER — Telehealth: Payer: Self-pay | Admitting: Family Medicine

## 2014-01-05 NOTE — Telephone Encounter (Signed)
Will fwd to MD.  Gyan Cambre L, CMA  

## 2014-01-05 NOTE — Telephone Encounter (Signed)
Refill request for test strips for glucometer. [facility] Irena Reichmann

## 2014-01-06 MED ORDER — GLUCOSE BLOOD VI STRP: ORAL_STRIP | Status: DC

## 2014-01-06 NOTE — Telephone Encounter (Signed)
Earlier this month we tried to reach pt about his test strips. I need to know what company his pharmacy benefits are through and the prior authorization info in order to get his test strips for him. He does not have pharmacy benefits through [employer].  I will rx a generic strip and perhaps the pharmacy can work out which kind he specifically needs. If that does not work we will need to find out his insurance info to contact the company.

## 2014-01-11 ENCOUNTER — Other Ambulatory Visit: Payer: Self-pay | Admitting: Family Medicine

## 2014-01-11 MED ORDER — INSULIN PEN NEEDLE 31G X 6 MM MISC: Status: DC

## 2014-01-14 ENCOUNTER — Telehealth: Payer: Self-pay | Admitting: *Deleted

## 2014-01-14 NOTE — Telephone Encounter (Signed)
Prior Authorization received from [facility] for Glucose Test Strips. Formulary and PA form placed in provider box for completion. Derl Barrow, RN

## 2014-01-17 ENCOUNTER — Other Ambulatory Visit: Payer: Self-pay | Admitting: Family Medicine

## 2014-01-17 MED ORDER — GLUCOSE BLOOD VI STRP: ORAL_STRIP | Status: DC

## 2014-01-17 NOTE — Telephone Encounter (Signed)
Form completed, will reurn to [employer] RN.  Leeanne Rio, MD

## 2014-01-18 NOTE — Telephone Encounter (Signed)
PA form faxed to [facility].  Derl Barrow, RN

## 2014-01-27 ENCOUNTER — Other Ambulatory Visit: Payer: Self-pay | Admitting: Family Medicine

## 2014-01-27 MED ORDER — ONETOUCH VERIO W/DEVICE KIT: 1.0000 | PACK | Freq: Once | Status: DC

## 2014-03-15 ENCOUNTER — Other Ambulatory Visit: Payer: Self-pay | Admitting: Family Medicine

## 2014-04-06 ENCOUNTER — Telehealth: Payer: Self-pay | Admitting: *Deleted

## 2014-04-06 NOTE — Telephone Encounter (Signed)
Misty from Dr. Simone Curia office requesting pt's last three most recent PSAs.  Only one PSA was found from 09/06/2011.  Pt's last office visit was 11/2013.  Derl Barrow, RN

## 2014-06-15 ENCOUNTER — Ambulatory Visit: Payer: BC Managed Care – PPO | Admitting: Family Medicine

## 2014-08-16 ENCOUNTER — Other Ambulatory Visit: Payer: Self-pay | Admitting: Family Medicine

## 2014-11-07 ENCOUNTER — Other Ambulatory Visit: Payer: Self-pay | Admitting: Family Medicine

## 2014-11-07 NOTE — Telephone Encounter (Signed)
Pt called and needs a refill on his insulin. He has an appointment scheduled on 12/21. jw

## 2014-11-08 MED ORDER — INSULIN GLARGINE 100 UNIT/ML SOLOSTAR PEN: 10.0000 [IU] | PEN_INJECTOR | Freq: Every morning | SUBCUTANEOUS | Status: DC

## 2014-11-28 ENCOUNTER — Encounter: Payer: Self-pay | Admitting: Family Medicine

## 2014-11-28 ENCOUNTER — Ambulatory Visit (INDEPENDENT_AMBULATORY_CARE_PROVIDER_SITE_OTHER): Payer: BC Managed Care – PPO | Admitting: Family Medicine

## 2014-11-28 VITALS — BP 129/70 | HR 64 | Temp 98.4°F | Ht 69.0 in | Wt 161.2 lb

## 2014-11-28 DIAGNOSIS — E118 Type 2 diabetes mellitus with unspecified complications: Secondary | ICD-10-CM | POA: Diagnosis not present

## 2014-11-28 DIAGNOSIS — E785 Hyperlipidemia, unspecified: Secondary | ICD-10-CM

## 2014-11-28 LAB — COMPREHENSIVE METABOLIC PANEL
ALBUMIN: 4.5 g/dL (ref 3.5–5.2)
ALK PHOS: 79 U/L (ref 39–117)
ALT: 12 U/L (ref 0–53)
AST: 13 U/L (ref 0–37)
BUN: 21 mg/dL (ref 6–23)
CO2: 28 mEq/L (ref 19–32)
Calcium: 10 mg/dL (ref 8.4–10.5)
Chloride: 104 mEq/L (ref 96–112)
Creat: 1.45 mg/dL — ABNORMAL HIGH (ref 0.50–1.35)
Glucose, Bld: 201 mg/dL — ABNORMAL HIGH (ref 70–99)
Potassium: 4.5 mEq/L (ref 3.5–5.3)
Sodium: 140 mEq/L (ref 135–145)
Total Bilirubin: 0.5 mg/dL (ref 0.2–1.2)
Total Protein: 7.8 g/dL (ref 6.0–8.3)

## 2014-11-28 LAB — POCT GLYCOSYLATED HEMOGLOBIN (HGB A1C): HEMOGLOBIN A1C: 7.8

## 2014-11-28 LAB — LIPID PANEL
Cholesterol: 286 mg/dL — ABNORMAL HIGH (ref 0–200)
HDL: 37 mg/dL — ABNORMAL LOW (ref >39)
LDL Cholesterol: 231 mg/dL — ABNORMAL HIGH (ref 0–99)
Total CHOL/HDL Ratio: 7.7 Ratio
Triglycerides: 90 mg/dL (ref <150)
VLDL: 18 mg/dL (ref 0–40)

## 2014-11-28 MED ORDER — ROSUVASTATIN CALCIUM 20 MG PO TABS: 20.0000 mg | ORAL_TABLET | Freq: Every day | ORAL | Status: DC

## 2014-11-28 MED ORDER — METFORMIN HCL 1000 MG PO TABS: 1000.0000 mg | ORAL_TABLET | Freq: Two times a day (BID) | ORAL | Status: DC

## 2014-11-28 MED ORDER — INSULIN GLARGINE 100 UNIT/ML SOLOSTAR PEN: 18.0000 [IU] | PEN_INJECTOR | Freq: Every morning | SUBCUTANEOUS | Status: DC

## 2014-11-28 NOTE — Patient Instructions (Addendum)
It was great to see you again today!  Resume lantus and metformin. I sent in refills. Check your blood sugar 3 times a day and write down on the chart I gave you. Return in 4 weeks so we can review this chart and decide if you need to take mealtime insulin as well.  I will call you if your test results are not normal.  Otherwise, I will send you a letter.  If you do not hear from me with in 2 weeks please call our office.      Be well, Dr. Ardelia Mems

## 2014-11-28 NOTE — Progress Notes (Signed)
Patient ID: Richard Witteman., male   DOB: 06/14/1956, 58 y.o.   MRN: [id number]  HPI:  Diabetes - has been out of metformin for a few days and out of lantus for about a week. Prior to running out was taking lantus 18 units every morning. Sugars were always over 75 in the morning, but as high as 400's in the afternoon/evening after meals. Does not do any mealtime coverage. Denies CP or SOB. Last eye exam was a few years ago, has eye doc he can call to set up appointment. Declines flu shot and pneumonia shot today. Has NOT been taking lisinopril 5mg  daily for renal protection because a doctor "took him off it". He doesn't know who did this or why.  HLD - states he is taking crestor, no problems with this medicine other than needs refill  ROS: See HPI.  [place]: hx BPH, HTN, monoclonal gammopathy, CKD stage 2, T2DM, HLD  PHYSICAL EXAM: BP 129/70 mmHg  Pulse 64  Temp(Src) 98.4 F (36.9 C) (Oral)  Ht 5\' 9"  (1.753 m)  Wt 161 lb 3.2 oz (73.12 kg)  BMI 23.79 kg/m2 Gen: NAD HEENT: NCAT Heart: RRR no murmurs Lungs: CTAB, NWOB Neuro: grossly nonfocal speech normal Ext: No appreciable lower extremity edema bilaterally   ASSESSMENT/PLAN:  Health maintenance:  -declined flu & pneumonia vaccines today  T2DM (type 2 diabetes mellitus) Will refill metformin & lantus, resume 18 units each morning. Gave printed CBG chart and asked pt to f/u in 1 monthone month to review readings andc onsider adding mealtime short acting insulin. Pt agreeable to this plan. Check A1c today.  Cardiac: on aspirin, statin. Checking lipids today Renal: hx of microalbuminuria, but pt states he was taken off lisinopril for some reason? Will reivew with pt at future visit Eye: instructed to schedule eye appt with his eye provider Foot: defer to next visit as could not locate monofilament in exam room   Hyperlipidemia Check lipids & CMET today. Continue crestor 20mg  daily.   FOLLOW UP: F/u in 1 month for titration of  insulin  Richard Gill, [place]

## 2014-11-28 NOTE — Assessment & Plan Note (Signed)
Will refill metformin & lantus, resume 18 units each morning. Gave printed CBG chart and asked pt to f/u in 1 monthone month to review readings andc onsider adding mealtime short acting insulin. Pt agreeable to this plan. Check A1c today.  Cardiac: on aspirin, statin. Checking lipids today Renal: hx of microalbuminuria, but pt states he was taken off lisinopril for some reason? Will reivew with pt at future visit Eye: instructed to schedule eye appt with his eye provider Foot: defer to next visit as could not locate monofilament in exam room

## 2014-11-28 NOTE — Assessment & Plan Note (Signed)
Check lipids & CMET today. Continue crestor 20mg  daily.

## 2014-11-29 ENCOUNTER — Encounter: Payer: Self-pay | Admitting: Family Medicine

## 2014-11-29 ENCOUNTER — Telehealth: Payer: Self-pay | Admitting: Family Medicine

## 2014-11-29 NOTE — Telephone Encounter (Signed)
  Error.  Hilton Sinclair, MD

## 2015-01-31 ENCOUNTER — Ambulatory Visit: Payer: Self-pay | Admitting: Family Medicine

## 2015-02-17 ENCOUNTER — Other Ambulatory Visit: Payer: Self-pay | Admitting: Family Medicine

## 2015-02-22 ENCOUNTER — Ambulatory Visit (INDEPENDENT_AMBULATORY_CARE_PROVIDER_SITE_OTHER): Payer: BLUE CROSS/BLUE SHIELD | Admitting: Family Medicine

## 2015-02-22 ENCOUNTER — Encounter: Payer: Self-pay | Admitting: Family Medicine

## 2015-02-22 VITALS — BP 136/91 | HR 55 | Temp 98.3°F | Ht 69.0 in | Wt 162.9 lb

## 2015-02-22 DIAGNOSIS — E119 Type 2 diabetes mellitus without complications: Secondary | ICD-10-CM

## 2015-02-22 DIAGNOSIS — E785 Hyperlipidemia, unspecified: Secondary | ICD-10-CM | POA: Diagnosis not present

## 2015-02-22 LAB — POCT GLYCOSYLATED HEMOGLOBIN (HGB A1C): Hemoglobin A1C: 7

## 2015-02-22 MED ORDER — ROSUVASTATIN CALCIUM 40 MG PO TABS: 40.0000 mg | ORAL_TABLET | Freq: Every day | ORAL | Status: DC

## 2015-02-22 NOTE — Patient Instructions (Addendum)
Increase Crestor to 40 mg daily. I sent in a new prescription for you. See the handout below about cholesterol diet. Switch your Lantus to taking at night. We are checking your kidneys for protein today.  Follow-up with me in 3 months.  Be well, Dr. Catha Brow and Cholesterol Control Diet Fat and cholesterol levels in your blood and organs are influenced by your diet. High levels of fat and cholesterol may lead to diseases of the heart, small and large blood vessels, gallbladder, liver, and pancreas. CONTROLLING FAT AND CHOLESTEROL WITH DIET Although exercise and lifestyle factors are important, your diet is key. That is because certain foods are known to raise cholesterol and others to lower it. The goal is to balance foods for their effect on cholesterol and more importantly, to replace saturated and trans fat with other types of fat, such as monounsaturated fat, polyunsaturated fat, and omega-3 fatty acids. On average, a person should consume no more than 15 to 17 g of saturated fat daily. Saturated and trans fats are considered "bad" fats, and they will raise LDL cholesterol. Saturated fats are primarily found in animal products such as meats, butter, and cream. However, that does not mean you need to give up all your favorite foods. Today, there are good tasting, low-fat, low-cholesterol substitutes for most of the things you like to eat. Choose low-fat or nonfat alternatives. Choose round or loin cuts of red meat. These types of cuts are lowest in fat and cholesterol. Chicken (without the skin), fish, veal, and ground [place] breast are great choices. Eliminate fatty meats, such as hot dogs and salami. Even shellfish have little or no saturated fat. Have a 3 oz (85 g) portion when you eat lean meat, poultry, or fish. Trans fats are also called "partially hydrogenated oils." They are oils that have been scientifically manipulated so that they are solid at room temperature resulting in a longer  shelf life and improved taste and texture of foods in which they are added. Trans fats are found in stick margarine, some tub margarines, cookies, crackers, and baked goods.  When baking and cooking, oils are a great substitute for butter. The monounsaturated oils are especially beneficial since it is believed they lower LDL and raise HDL. The oils you should avoid entirely are saturated tropical oils, such as coconut and palm.  Remember to eat a lot from food groups that are naturally free of saturated and trans fat, including fish, fruit, vegetables, beans, grains (barley, rice, couscous, bulgur wheat), and pasta (without cream sauces).  IDENTIFYING FOODS THAT LOWER FAT AND CHOLESTEROL  Soluble fiber may lower your cholesterol. This type of fiber is found in fruits such as apples, vegetables such as broccoli, potatoes, and carrots, legumes such as beans, peas, and lentils, and grains such as barley. Foods fortified with plant sterols (phytosterol) may also lower cholesterol. You should eat at least 2 g per day of these foods for a cholesterol lowering effect.  Read package labels to identify low-saturated fats, trans fat free, and low-fat foods at the supermarket. Select cheeses that have only 2 to 3 g saturated fat per ounce. Use a heart-healthy tub margarine that is free of trans fats or partially hydrogenated oil. When buying baked goods (cookies, crackers), avoid partially hydrogenated oils. Breads and muffins should be made from whole grains (whole-wheat or whole oat flour, instead of "flour" or "enriched flour"). Buy non-creamy canned soups with reduced salt and no added fats.  FOOD PREPARATION TECHNIQUES  Never deep-fry. If you must fry, either stir-fry, which uses very little fat, or use non-stick cooking sprays. When possible, broil, bake, or roast meats, and steam vegetables. Instead of putting butter or margarine on vegetables, use lemon and herbs, applesauce, and cinnamon (for squash and sweet  potatoes). Use nonfat yogurt, salsa, and low-fat dressings for salads.  LOW-SATURATED FAT / LOW-FAT FOOD SUBSTITUTES Meats / Saturated Fat (g)  Avoid: Steak, marbled (3 oz/85 g) / 11 g  Choose: Steak, lean (3 oz/85 g) / 4 g  Avoid: Hamburger (3 oz/85 g) / 7 g  Choose: Hamburger, lean (3 oz/85 g) / 5 g  Avoid: Ham (3 oz/85 g) / 6 g  Choose: Ham, lean cut (3 oz/85 g) / 2.4 g  Avoid: Chicken, with skin, dark meat (3 oz/85 g) / 4 g  Choose: Chicken, skin removed, dark meat (3 oz/85 g) / 2 g  Avoid: Chicken, with skin, light meat (3 oz/85 g) / 2.5 g  Choose: Chicken, skin removed, light meat (3 oz/85 g) / 1 g Dairy / Saturated Fat (g)  Avoid: Whole milk (1 cup) / 5 g  Choose: Low-fat milk, 2% (1 cup) / 3 g  Choose: Low-fat milk, 1% (1 cup) / 1.5 g  Choose: Skim milk (1 cup) / 0.3 g  Avoid: Hard cheese (1 oz/28 g) / 6 g  Choose: Skim milk cheese (1 oz/28 g) / 2 to 3 g  Avoid: Cottage cheese, 4% fat (1 cup) / 6.5 g  Choose: Low-fat cottage cheese, 1% fat (1 cup) / 1.5 g  Avoid: Ice cream (1 cup) / 9 g  Choose: Sherbet (1 cup) / 2.5 g  Choose: Nonfat frozen yogurt (1 cup) / 0.3 g  Choose: Frozen fruit bar / trace  Avoid: Whipped cream (1 tbs) / 3.5 g  Choose: Nondairy whipped topping (1 tbs) / 1 g Condiments / Saturated Fat (g)  Avoid: Mayonnaise (1 tbs) / 2 g  Choose: Low-fat mayonnaise (1 tbs) / 1 g  Avoid: Butter (1 tbs) / 7 g  Choose: Extra light margarine (1 tbs) / 1 g  Avoid: Coconut oil (1 tbs) / 11.8 g  Choose: Olive oil (1 tbs) / 1.8 g  Choose: Corn oil (1 tbs) / 1.7 g  Choose: Safflower oil (1 tbs) / 1.2 g  Choose: Sunflower oil (1 tbs) / 1.4 g  Choose: Soybean oil (1 tbs) / 2.4 g  Choose: Canola oil (1 tbs) / 1 g Document Released: 11/25/2005 Document Revised: 03/22/2013 Document Reviewed: 02/23/2014 Exit[employer] Patient Information 2015 [place], [place]. This information is not intended to replace advice given to you by your health care  provider. Make sure you discuss any questions you have with your health care provider.

## 2015-02-23 LAB — MICROALBUMIN / CREATININE URINE RATIO
Creatinine, Urine: 66 mg/dL
MICROALB UR: 5.5 mg/dL — AB (ref <2.0)
Microalb Creat Ratio: 83.3 mg/g — ABNORMAL HIGH (ref 0.0–30.0)

## 2015-02-24 NOTE — Assessment & Plan Note (Addendum)
A1c 7.0. This is acceptable for patient. Continue Lantus 25 units daily as well as metformin. He will switch his Lantus to evening dosing so that we can get good measures of his fasting blood sugars.  Cardiac: on aspirin, statin (incr dose today) Renal: check urine microalbumin today Eye: UTD per patient Foot: done today, normal

## 2015-02-24 NOTE — Assessment & Plan Note (Signed)
Uncontrolled. Gave handout on low cholesterol diet. Increase Crestor to 40 mg daily.

## 2015-02-24 NOTE — Progress Notes (Signed)
Patient ID: Richard Westbay Jr., male   DOB: 08/03/1956, 59 y.o.   MRN: [id number]  HPI:  Diabetes: Patient reports taking Lantus 25 units daily. He went to the eye doctor on Monday and was told his eyes were healthy. Denies chest pain or shortness of breath. He reports that he is not taking lisinopril as this was apparently discontinued in the past, although the reasons for this are unclear.  Hyperlipidemia: Last lipid panel showed poorly controlled LDL. Currently taking Crestor 20 mg daily. Willing to increase this dose. He is interested in a low cholesterol diet as well.  ROS: See HPI.  PMFSH: BPH, hypertension, smoldering multiple myeloma, stage II CKD, type 2 diabetes, hyperlipidemia  PHYSICAL EXAM: BP 136/91 mmHg  Pulse 55  Temp(Src) 98.3 F (36.8 C) (Oral)  Ht 5' 9" (1.753 m)  Wt 162 lb 14.4 oz (73.891 kg)  BMI 24.05 kg/m2 Gen: NAD, pleasant, cooperative Heart: RRR Lungs: CTAB, NWOB Neuro: grossly nonfocal, speech intact Ext: No appreciable lower extremity edema bilaterally  Diabetic foot exam: 2+ DP pulses bilat, normal monofilament testing bilaterally. No lesions or significant calluses.   ASSESSMENT/PLAN:  T2DM (type 2 diabetes mellitus) A1c 7.0. This is acceptable for patient. Continue Lantus 25 units daily as well as metformin. He will switch his Lantus to evening dosing so that we can get good measures of his fasting blood sugars.  Cardiac: on aspirin, statin (incr dose today) Renal: check urine microalbumin today Eye: UTD per patient Foot: done today, normal   Hyperlipidemia Uncontrolled. Gave handout on low cholesterol diet. Increase Crestor to 40 mg daily.    FOLLOW UP: F/u in 3 months for DM   J. , MD [facility]  

## 2015-03-02 ENCOUNTER — Telehealth: Payer: Self-pay | Admitting: Family Medicine

## 2015-03-02 DIAGNOSIS — E1121 Type 2 diabetes mellitus with diabetic nephropathy: Secondary | ICD-10-CM

## 2015-03-02 MED ORDER — LISINOPRIL 5 MG PO TABS: 5.0000 mg | ORAL_TABLET | Freq: Every day | ORAL | Status: DC

## 2015-03-02 NOTE — Telephone Encounter (Signed)
Red team, Please call patient and let him know that I would like him to take lisinopril 5 mg daily to protect his kidneys, because his urine test showed that he had some protein in his urine. I have sent in a new prescription for lisinopril. He will need to follow-up in about one week for a repeat lab check to make sure that his kidneys tolerate this medicine. I am happy to speak with him if he has any questions.  Thanks, Leeanne Rio, MD

## 2015-03-07 NOTE — Telephone Encounter (Signed)
Patient informed, expressed understanding. Will call back to schedule lab visit.

## 2015-03-15 ENCOUNTER — Other Ambulatory Visit: Payer: BLUE CROSS/BLUE SHIELD

## 2015-03-15 DIAGNOSIS — E1121 Type 2 diabetes mellitus with diabetic nephropathy: Secondary | ICD-10-CM

## 2015-03-15 LAB — BASIC METABOLIC PANEL
BUN: 20 mg/dL (ref 6–23)
CO2: 27 meq/L (ref 19–32)
Calcium: 9.4 mg/dL (ref 8.4–10.5)
Chloride: 104 mEq/L (ref 96–112)
Creat: 1.37 mg/dL — ABNORMAL HIGH (ref 0.50–1.35)
GLUCOSE: 131 mg/dL — AB (ref 70–99)
Potassium: 4.5 mEq/L (ref 3.5–5.3)
SODIUM: 142 meq/L (ref 135–145)

## 2015-03-15 NOTE — Progress Notes (Signed)
[facility] phlebotomist drew:  BMP 

## 2015-03-16 ENCOUNTER — Encounter: Payer: Self-pay | Admitting: Family Medicine

## 2015-09-03 ENCOUNTER — Other Ambulatory Visit: Payer: Self-pay | Admitting: Family Medicine

## 2015-09-04 ENCOUNTER — Telehealth: Payer: Self-pay | Admitting: Family Medicine

## 2015-09-04 NOTE — Telephone Encounter (Signed)
To [facility] red team - please call pt and let him know I have refilled his metformin but he needs to schedule an appointment to follow up on his diabetes. Thanks!  Leeanne Rio, MD

## 2015-09-05 NOTE — Telephone Encounter (Signed)
Spoke with patient and informed him of message from MD, he states he will call back an schedule an appointment.

## 2015-11-01 ENCOUNTER — Telehealth: Payer: Self-pay | Admitting: Family Medicine

## 2015-11-01 NOTE — Telephone Encounter (Signed)
Spoke with patient wife as she states that patietn was asleep. She is adamant that the crestor has "sulfates" in it and was causing patient to have body aches despite being told multiple times that this was untrue, also states that patient needs something for pain. Would like to speak with MD.

## 2015-11-01 NOTE — Telephone Encounter (Signed)
Called and spoke with wife. She reports reading online that crestor has "sulfates" in it. Note patient is allergic to sulfa drugs. I am not aware of crestor cross reacting with sulfa allergies. Wife stated she will not let patient take crestor anymore.  He is having back pain and fever. I recommended he be seen in Urgent Care or ED for evaluation. I cannot send in medicine for him for a new acute issue such as this without evaluating him first. I have not seen him in clinic in 8 months. Wife understood these recommendations. I advised if she does not want him to take crestor, then that is their choice, but for the acute issue he needs to be seen.   Leeanne Rio, MD

## 2015-11-01 NOTE — Telephone Encounter (Signed)
Pt has just recently started taking Crestor. He is allergic to sulfates.  Please change to something else.  He is "down today"-a lot of pain, not feeling well at all

## 2015-11-02 ENCOUNTER — Observation Stay (HOSPITAL_COMMUNITY)
Admission: EM | Admit: 2015-11-02 | Discharge: 2015-11-06 | Disposition: A | Discharge status: Home / Self Care | Payer: BLUE CROSS/BLUE SHIELD | Attending: Family Medicine | Admitting: Family Medicine

## 2015-11-02 ENCOUNTER — Encounter (HOSPITAL_COMMUNITY): Payer: Self-pay | Admitting: Emergency Medicine

## 2015-11-02 ENCOUNTER — Emergency Department (HOSPITAL_COMMUNITY): Payer: BLUE CROSS/BLUE SHIELD

## 2015-11-02 DIAGNOSIS — N183 Chronic kidney disease, stage 3 unspecified: Secondary | ICD-10-CM | POA: Insufficient documentation

## 2015-11-02 DIAGNOSIS — N401 Enlarged prostate with lower urinary tract symptoms: Secondary | ICD-10-CM | POA: Insufficient documentation

## 2015-11-02 DIAGNOSIS — E785 Hyperlipidemia, unspecified: Secondary | ICD-10-CM

## 2015-11-02 DIAGNOSIS — N400 Enlarged prostate without lower urinary tract symptoms: Secondary | ICD-10-CM | POA: Diagnosis not present

## 2015-11-02 DIAGNOSIS — Z794 Long term (current) use of insulin: Secondary | ICD-10-CM | POA: Insufficient documentation

## 2015-11-02 DIAGNOSIS — N10 Acute tubulo-interstitial nephritis: Principal | ICD-10-CM | POA: Insufficient documentation

## 2015-11-02 DIAGNOSIS — E1122 Type 2 diabetes mellitus with diabetic chronic kidney disease: Secondary | ICD-10-CM | POA: Diagnosis not present

## 2015-11-02 DIAGNOSIS — N200 Calculus of kidney: Secondary | ICD-10-CM | POA: Insufficient documentation

## 2015-11-02 DIAGNOSIS — N179 Acute kidney failure, unspecified: Secondary | ICD-10-CM | POA: Insufficient documentation

## 2015-11-02 DIAGNOSIS — R338 Other retention of urine: Secondary | ICD-10-CM

## 2015-11-02 DIAGNOSIS — N12 Tubulo-interstitial nephritis, not specified as acute or chronic: Secondary | ICD-10-CM | POA: Diagnosis not present

## 2015-11-02 DIAGNOSIS — E119 Type 2 diabetes mellitus without complications: Secondary | ICD-10-CM | POA: Insufficient documentation

## 2015-11-02 LAB — BASIC METABOLIC PANEL
Anion gap: 11 (ref 5–15)
BUN: 28 mg/dL — ABNORMAL HIGH (ref 6–20)
CALCIUM: 9.5 mg/dL (ref 8.9–10.3)
CO2: 20 mmol/L — AB (ref 22–32)
CREATININE: 1.91 mg/dL — AB (ref 0.61–1.24)
Chloride: 106 mmol/L (ref 101–111)
GFR, EST AFRICAN AMERICAN: 42 mL/min — AB (ref >60)
GFR, EST NON AFRICAN AMERICAN: 36 mL/min — AB (ref >60)
Glucose, Bld: 269 mg/dL — ABNORMAL HIGH (ref 65–99)
Potassium: 4.1 mmol/L (ref 3.5–5.1)
SODIUM: 137 mmol/L (ref 135–145)

## 2015-11-02 LAB — CBC WITH DIFFERENTIAL/PLATELET
BASOS ABS: 0 10*3/uL (ref 0.0–0.1)
Basophils Relative: 0 %
EOS ABS: 0 10*3/uL (ref 0.0–0.7)
Eosinophils Relative: 0 %
HEMATOCRIT: 27.3 % — AB (ref 39.0–52.0)
Hemoglobin: 9.5 g/dL — ABNORMAL LOW (ref 13.0–17.0)
Lymphocytes Relative: 6 %
Lymphs Abs: 0.6 10*3/uL — ABNORMAL LOW (ref 0.7–4.0)
MCH: 30.4 pg (ref 26.0–34.0)
MCHC: 34.8 g/dL (ref 30.0–36.0)
MCV: 87.5 fL (ref 78.0–100.0)
MONO ABS: 0.7 10*3/uL (ref 0.1–1.0)
Monocytes Relative: 7 %
NEUTROS ABS: 8.7 10*3/uL — AB (ref 1.7–7.7)
Neutrophils Relative %: 87 %
PLATELETS: 161 10*3/uL (ref 150–400)
RBC: 3.12 MIL/uL — ABNORMAL LOW (ref 4.22–5.81)
RDW: 13.6 % (ref 11.5–15.5)
WBC: 9.9 10*3/uL (ref 4.0–10.5)

## 2015-11-02 LAB — URINALYSIS, ROUTINE W REFLEX MICROSCOPIC
BILIRUBIN URINE: NEGATIVE
Glucose, UA: 500 mg/dL — AB
Ketones, ur: NEGATIVE mg/dL
NITRITE: NEGATIVE
Protein, ur: 100 mg/dL — AB
Specific Gravity, Urine: 1.017 (ref 1.005–1.030)
pH: 5 (ref 5.0–8.0)

## 2015-11-02 LAB — URINE MICROSCOPIC-ADD ON

## 2015-11-02 LAB — GLUCOSE, CAPILLARY
GLUCOSE-CAPILLARY: 270 mg/dL — AB (ref 65–99)
Glucose-Capillary: 284 mg/dL — ABNORMAL HIGH (ref 65–99)

## 2015-11-02 MED ORDER — ACETAMINOPHEN 325 MG PO TABS: 650.0000 mg | ORAL_TABLET | Freq: Four times a day (QID) | ORAL | Status: DC | PRN

## 2015-11-02 MED ORDER — INSULIN GLARGINE 100 UNIT/ML ~~LOC~~ SOLN
9.0000 [IU] | Freq: Every morning | SUBCUTANEOUS | Status: DC
  Administered 2015-11-03 – 2015-11-06 (×4): 9 [IU] via SUBCUTANEOUS
  Filled 2015-11-02 (×4): qty 0.09

## 2015-11-02 MED ORDER — INSULIN ASPART 100 UNIT/ML ~~LOC~~ SOLN
0.0000 [IU] | Freq: Every day | SUBCUTANEOUS | Status: DC
  Administered 2015-11-02 – 2015-11-04 (×3): 3 [IU] via SUBCUTANEOUS

## 2015-11-02 MED ORDER — INSULIN ASPART 100 UNIT/ML ~~LOC~~ SOLN
0.0000 [IU] | Freq: Three times a day (TID) | SUBCUTANEOUS | Status: DC
  Administered 2015-11-03: 9 [IU] via SUBCUTANEOUS
  Administered 2015-11-03: 3 [IU] via SUBCUTANEOUS
  Administered 2015-11-03: 2 [IU] via SUBCUTANEOUS
  Administered 2015-11-04: 5 [IU] via SUBCUTANEOUS
  Administered 2015-11-04: 3 [IU] via SUBCUTANEOUS
  Administered 2015-11-04: 2 [IU] via SUBCUTANEOUS
  Administered 2015-11-05: 3 [IU] via SUBCUTANEOUS
  Administered 2015-11-05: 5 [IU] via SUBCUTANEOUS
  Administered 2015-11-05: 3 [IU] via SUBCUTANEOUS
  Administered 2015-11-06: 2 [IU] via SUBCUTANEOUS
  Administered 2015-11-06: 5 [IU] via SUBCUTANEOUS

## 2015-11-02 MED ORDER — OXYCODONE-ACETAMINOPHEN 5-325 MG PO TABS: 2.0000 | ORAL_TABLET | Freq: Once | ORAL | Status: DC

## 2015-11-02 MED ORDER — HYDROMORPHONE HCL 1 MG/ML IJ SOLN
1.0000 mg | Freq: Once | INTRAMUSCULAR | Status: AC
  Administered 2015-11-02: 1 mg via INTRAVENOUS
  Filled 2015-11-02: qty 1

## 2015-11-02 MED ORDER — ONDANSETRON HCL 4 MG/2ML IJ SOLN: 4.0000 mg | Freq: Four times a day (QID) | INTRAMUSCULAR | Status: DC | PRN

## 2015-11-02 MED ORDER — SODIUM CHLORIDE 0.9 % IV SOLN
INTRAVENOUS | Status: DC
  Administered 2015-11-02: 18:00:00 via INTRAVENOUS

## 2015-11-02 MED ORDER — ONDANSETRON HCL 4 MG PO TABS: 4.0000 mg | ORAL_TABLET | Freq: Four times a day (QID) | ORAL | Status: DC | PRN

## 2015-11-02 MED ORDER — HYDROMORPHONE HCL 1 MG/ML IJ SOLN
0.5000 mg | INTRAMUSCULAR | Status: DC | PRN
Start: 2015-11-02 — End: 2015-11-03
  Administered 2015-11-02 (×2): 0.5 mg via INTRAVENOUS
  Filled 2015-11-02 (×2): qty 1

## 2015-11-02 MED ORDER — DEXTROSE 5 % IV SOLN
1.0000 g | INTRAVENOUS | Status: DC
  Administered 2015-11-02 – 2015-11-03 (×2): 1 g via INTRAVENOUS
  Filled 2015-11-02 (×3): qty 10

## 2015-11-02 MED ORDER — HYDROXYZINE HCL 25 MG PO TABS: 25.0000 mg | ORAL_TABLET | Freq: Every evening | ORAL | Status: DC | PRN

## 2015-11-02 MED ORDER — ROSUVASTATIN CALCIUM 20 MG PO TABS
40.0000 mg | ORAL_TABLET | Freq: Every day | ORAL | Status: DC
  Administered 2015-11-03 – 2015-11-06 (×4): 40 mg via ORAL
  Filled 2015-11-02 (×5): qty 2

## 2015-11-02 MED ORDER — ASPIRIN EC 81 MG PO TBEC
81.0000 mg | DELAYED_RELEASE_TABLET | Freq: Every day | ORAL | Status: DC
  Administered 2015-11-02 – 2015-11-06 (×5): 81 mg via ORAL
  Filled 2015-11-02 (×5): qty 1

## 2015-11-02 MED ORDER — ONDANSETRON HCL 4 MG/2ML IJ SOLN
4.0000 mg | Freq: Once | INTRAMUSCULAR | Status: AC
  Administered 2015-11-02: 4 mg via INTRAVENOUS
  Filled 2015-11-02: qty 2

## 2015-11-02 MED ORDER — TAMSULOSIN HCL 0.4 MG PO CAPS
0.4000 mg | ORAL_CAPSULE | Freq: Every day | ORAL | Status: DC
  Administered 2015-11-02 – 2015-11-05 (×4): 0.4 mg via ORAL
  Filled 2015-11-02 (×4): qty 1

## 2015-11-02 MED ORDER — ENOXAPARIN SODIUM 40 MG/0.4ML ~~LOC~~ SOLN
40.0000 mg | SUBCUTANEOUS | Status: DC
  Administered 2015-11-03 – 2015-11-05 (×3): 40 mg via SUBCUTANEOUS
  Filled 2015-11-02 (×4): qty 0.4

## 2015-11-02 MED ORDER — ACETAMINOPHEN 650 MG RE SUPP: 650.0000 mg | Freq: Four times a day (QID) | RECTAL | Status: DC | PRN

## 2015-11-02 NOTE — ED Provider Notes (Signed)
CSN: [id number]     Arrival date & time 11/02/15  1230 History   First MD Initiated Contact with Patient 11/02/15 1232     Chief Complaint  Patient presents with  . Flank Pain  . Dysuria     (Consider location/radiation/quality/duration/timing/severity/associated sxs/prior Treatment) HPI Comments: Patient here with 24 hours of left-sided flank pain with cloudy and dark urine. No fever chills no vomiting. Patient does have CT results from 10 days ago which showed a right sided calculus and did not make mention of a left-sided 1. Denies any hematuria. Pain is characterized as sharp and persistent. Used over-the-counter medications without relief.  Patient is a 59 y.o. male presenting with flank pain and dysuria. The history is provided by the patient.  Flank Pain  Dysuria    Past Medical History  Diagnosis Date  . Hyperlipidemia   . BPH (benign prostatic hyperplasia)   . Prostatitis   . 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   . T2DM (type 2 diabetes mellitus) ([place]) 2005    lantus x1 year  . Chronic kidney disease, stage II (mild)     Dr. Justin Mend follows- holding steady  . Sickle cell trait [facility])    Past Surgical History  Procedure Laterality Date  . Colonoscopy  2014    Dr. Benson Norway, was told to f/u in 2017-2019  . Bunionectomy Bilateral 08-25-13  . Transurethral resection of prostate N/A 09/06/2013    Procedure: TRANSURETHRAL RESECTION OF THE PROSTATE WITH GYRUS INSTRUMENTS;  Surgeon: [name], MD;  Location: [facility] ORS;  Service: Urology;  Laterality: N/A;   Family History  Problem Relation Age of Onset  . Heart attack Father 34  . Colon cancer Sister 66  . Breast cancer Sister   . Colon cancer Sister 72   Social History  Substance Use Topics  . Smoking status: Former Smoker -- 1.00 packs/day for 10 years    Quit date: 01/28/1991  . Smokeless tobacco: Never Used  . Alcohol Use: No    Review of Systems   Genitourinary: Positive for dysuria and flank pain.  All other systems reviewed and are negative.     Allergies  Magnesium-containing compounds and Smz-tmp ds  Home Medications   Prior to Admission medications   Medication Sig Start Date End Date Taking? Authorizing Provider  aspirin EC 81 MG tablet Take 1 tablet (81 mg total) by mouth daily. 12/08/13   Leeanne Rio, MD  Blood Glucose Monitoring Suppl (ONETOUCH VERIO) W/DEVICE KIT 1 kit by Does not apply route once. 01/27/14   Leeanne Rio, MD  glucose blood (RELION CONFIRM/MICRO TEST) test strip Check blood sugar 4 times daily. 01/17/14   Leeanne Rio, MD  glucose blood test strip Check blood sugar 4 times daily. 01/06/14   Leeanne Rio, MD  hydrOXYzine (ATARAX/VISTARIL) 25 MG tablet TAKE ONE TABLET BY MOUTH THREE TIMES DAILY AS NEEDED FOR  ITCHING  OR  HIVES Patient not taking: Reported on 11/28/2014    Leeanne Rio, MD  Insulin Glargine (LANTUS SOLOSTAR) 100 UNIT/ML Solostar Pen Inject 18 Units into the skin every morning. 11/28/14   Leeanne Rio, MD  lisinopril (PRINIVIL,ZESTRIL) 5 MG tablet Take 1 tablet (5 mg total) by mouth daily. 03/02/15   Leeanne Rio, MD  metFORMIN (GLUCOPHAGE) 1000 MG tablet TAKE ONE TABLET BY MOUTH TWICE DAILY WITH A MEAL. 09/04/15   Leeanne Rio, MD  Misc Natural Products (  PROSTATE) CAPS Take 1 capsule by mouth 3 (three) times daily.     Historical Provider, MD  Multiple Vitamins-Minerals (MULTIVITAMIN WITH MINERALS) tablet Take 1 tablet by mouth daily.    Historical Provider, MD  RELION MINI PEN NEEDLES 31G X 6 MM MISC USE AS DIRECTED ONCE DAILY 02/17/15   Leeanne Rio, MD  rosuvastatin (CRESTOR) 40 MG tablet Take 1 tablet (40 mg total) by mouth daily. 02/22/15   Leeanne Rio, MD   BP 114/85 mmHg  Pulse 73  Temp(Src) 98.1 F (36.7 C) (Oral)  Resp 20  SpO2 99% Physical Exam  Constitutional: He is oriented to person, place, and time. He  appears well-developed and well-nourished.  Non-toxic appearance. No distress.  HENT:  Head: Normocephalic and atraumatic.  Eyes: Conjunctivae, EOM and lids are normal. Pupils are equal, round, and reactive to light.  Neck: Normal range of motion. Neck supple. No tracheal deviation present. No thyroid mass present.  Cardiovascular: Normal rate, regular rhythm and normal heart sounds.  Exam reveals no gallop.   No murmur heard. Pulmonary/Chest: Effort normal and breath sounds normal. No stridor. No respiratory distress. He has no decreased breath sounds. He has no wheezes. He has no rhonchi. He has no rales.  Abdominal: Soft. Normal appearance and bowel sounds are normal. He exhibits no distension. There is tenderness. There is CVA tenderness. There is no rigidity, no rebound and no guarding.  Musculoskeletal: Normal range of motion. He exhibits no edema or tenderness.  Neurological: He is alert and oriented to person, place, and time. He has normal strength. No cranial nerve deficit or sensory deficit. GCS eye subscore is 4. GCS verbal subscore is 5. GCS motor subscore is 6.  Skin: Skin is warm and dry. No abrasion and no rash noted.  Psychiatric: He has a normal mood and affect. His speech is normal and behavior is normal.  Nursing note and vitals reviewed.   ED Course  Procedures (including critical care time) Labs Review Labs Reviewed  URINE CULTURE  URINALYSIS, ROUTINE W REFLEX MICROSCOPIC (NOT AT [facility])  CBC WITH DIFFERENTIAL/PLATELET  BASIC METABOLIC PANEL    Imaging Review No results found. I have personally reviewed and evaluated these images and lab results as part of my medical decision-making.   EKG Interpretation None      MDM   Final diagnoses:  None    Patient given hydromorphone 2  24m. Patient started on Rocephin for polynephritis and does have evidence of worsening renal function on his Bmet. Continues to endorse pain and will be admitted to the family  medicine service    ALacretia Leigh MD 11/02/15 [phone]

## 2015-11-02 NOTE — H&P (Signed)
[facility] Admission History and Physical Service Pager: [phone]  Patient name: Richard Gill. Medical record number: [id number] Date of birth: [birth year] Age: 59 y.o. Gender: male  Primary Care Provider: Chrisandra Netters, MD Consultants: None Code Status: Full  Chief Complaint: left flank pain, dysuria  Assessment and Plan: Richard Gill. is a 59 y.o. male presenting with dysuria and left flank pain . PMH is significant for CKD, T2DM, BPH, HLD, sickle cell trait, MGUS.  Dysuria, left-sided flank pain, AKI on CKD: Possible pyelonephritis with perinephric inflammation seen at upper pole of left kidney on CT abdomen. 4 mm kidney stone present and could be contributing to pain but is within lower pole of right renal pelvis. Cr 1.91 on admission, baseline appears to be around 1.4. -s/p 1 g rocephin in [place] ED, if doing well tomorrow may consider early transition to orals -urine cx -IVF overnight -repeat CBC and BMP in the AM -hold metformin -continue dilaudid 0.5mg  q3hrs prn severe pain overnight  T2DM: Well-controlled. On Lantus 18 units daily and 1,000 mg metformin BID. Last hgb A1c 7.0 on 02/22/15.  -Hold metformin due to AKI -Continue lantus but at half home dose (9 units) -SSI sensitive per protocol  Hyperlipidemia: stable -continue statin  BPH - continue tamsulosin  Anemia, normocytic: appears stable, last hgb in chart 09/07/13 is 8.4 and on admission 9.5. Most likely ACD in setting of CKD. Large hgb on UA but no other evidence of bleeding. - cbc tomorrow  FEN/GI: NS IVFs @ 75 mL/hr, Zofran Prophylaxis: None  Disposition: Admitted under observation to med-surg, Attending Dr. Nori Riis. Anticipate discharge 11/25 once transitioned to PO antibiotics.   History of Present Illness:  Richard Gill. is a 59 y.o. male presenting with left flank pain and dysuria. Has been having issues for several weeks, started with blood in his urine and he  went to his Urologist to be evaluated. He had a CT abdomen to check his prostate and bladder several weeks that showed a stone; does not report passing any stones since then. He has had cloudy urine this morning but not darker. He has been having pain last night in his "kidney" on the left side. He has been having dysuria as well. Some nausea but no vomiting. He has had a small fever measured yesterday. He denies any other pain.   He has a history of BPH with history of TURP, kidney disease, diabetes. Followed by urology, most recently had cystocopy on Tuesday and was told everything was "fine".  Review Of Systems: Per HPI with the following additions: small fever, no CP, no SOB, no vomiting, no  Otherwise the remainder of the systems were negative.  Patient Active Problem List   Diagnosis Date Noted  . Pyelonephritis 11/02/2015  . Rash and nonspecific skin eruption 05/03/2013  . Monoclonal gammopathy   . Chronic kidney disease, stage II (mild)   . Hives 03/29/2013  . Positive double stranded DNA antibody test 02/09/2013  . Body aches 02/04/2013  . HTN (hypertension) 11/03/2012  . Elevated serum creatinine 10/16/2012  . T2DM (type 2 diabetes mellitus) ([place]) 01/24/2011  . BPH (benign prostatic hyperplasia) 01/24/2011  . Hyperlipidemia 01/24/2011    Past Medical History: Past Medical History  Diagnosis Date  . Hyperlipidemia   . BPH (benign prostatic hyperplasia)   . Prostatitis   . 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   . T2DM (type 2 diabetes mellitus) (  [place]) 2005    lantus x1 year  . Chronic kidney disease, stage II (mild)     Dr. Justin Mend follows- holding steady  . Sickle cell trait [facility])     Past Surgical History: Past Surgical History  Procedure Laterality Date  . Colonoscopy  2014    Dr. Benson Norway, was told to f/u in 2017-2019  . Bunionectomy Bilateral 08-25-13  . Transurethral resection of prostate N/A 09/06/2013     Procedure: TRANSURETHRAL RESECTION OF THE PROSTATE WITH GYRUS INSTRUMENTS;  Surgeon: [name], MD;  Location: [facility];  Service: Urology;  Laterality: N/A;    Social History: Social History  Substance Use Topics  . Smoking status: Former Smoker -- 1.00 packs/day for 10 years    Quit date: 01/28/1991  . Smokeless tobacco: Never Used  . Alcohol Use: No   Please also refer to relevant sections of EMR.  Family History: Family History  Problem Relation Age of Onset  . Heart attack Father 59  . Colon cancer Sister 9  . Breast cancer Sister   . Colon cancer Sister 21    Allergies and Medications: Allergies  Allergen Reactions  . Magnesium-Containing Compounds   . Smz-Tmp Ds [Sulfamethoxazole-Trimethoprim]     Headache, joint pain fever   No current facility-administered medications on file prior to encounter.   Current Outpatient Prescriptions on File Prior to Encounter  Medication Sig Dispense Refill  . aspirin EC 81 MG tablet Take 1 tablet (81 mg total) by mouth daily. 90 tablet 3  . hydrOXYzine (ATARAX/VISTARIL) 25 MG tablet TAKE ONE TABLET BY MOUTH THREE TIMES DAILY AS NEEDED FOR  ITCHING  OR  HIVES 30 tablet 0  . Insulin Glargine (LANTUS SOLOSTAR) 100 UNIT/ML Solostar Pen Inject 18 Units into the skin every morning. 15 mL 2  . metFORMIN (GLUCOPHAGE) 1000 MG tablet TAKE ONE TABLET BY MOUTH TWICE DAILY WITH A MEAL. 180 tablet 0  . Misc Natural Products (PROSTATE) CAPS Take 1 capsule by mouth 2 (two) times daily.     . rosuvastatin (CRESTOR) 40 MG tablet Take 1 tablet (40 mg total) by mouth daily. 90 tablet 1  . lisinopril (PRINIVIL,ZESTRIL) 5 MG tablet Take 1 tablet (5 mg total) by mouth daily. (Patient not taking: Reported on 11/02/2015) 90 tablet 0    Objective: BP 149/75 mmHg  Pulse 96  Temp(Src) 98.8 F (37.1 C) (Oral)  Resp 18  SpO2 97% Exam: General: Tired-appearing, thin male in NAD HEENT: Tachy mucous membranes, no oropharyngeal lesions Neck:  Supple Cardiovascular: RRR, S1, S2, no m/r/g. 2+ radial, PT pulses bilaterally  Respiratory: CTAB, normal effort Abdomen: +BS, soft, no guarding, "pressure" in bladder with palpation of RLQ MSK: small amount of left sided CVA tenderness, no right CVA tenderness Skin: Warm, dry, well-perfused Neuro: AOx3. No deficits noted. Psych: Normal mood, affect. Normal thought content and speech  Labs and Imaging: CBC BMET   Recent Labs Lab 11/02/15 1302  WBC 9.9  HGB 9.5*  HCT 27.3*  PLT 161    Recent Labs Lab 11/02/15 1302  NA 137  K 4.1  CL 106  CO2 20*  BUN 28*  CREATININE 1.91*  GLUCOSE 269*  CALCIUM 9.5     Ct Renal Stone Study  11/02/2015  CLINICAL DATA:  History of chronic kidney disease, left-sided pain radiating to left flank. Cloudy urine, nausea, dysuria onset yesterday. EXAM: CT ABDOMEN AND PELVIS WITHOUT CONTRAST TECHNIQUE: Multidetector CT imaging of the abdomen and pelvis was performed following the standard protocol  without IV contrast. COMPARISON:  CT abdomen and pelvis dated 10/23/2015. FINDINGS: Lower chest: A 7 mm pulmonary nodule at the right lung base is unchanged in the short-term interval. Again noted is mild dependent atelectasis at each lung base. No acute findings. Hepatobiliary: No mass visualized on this un-enhanced exam. No gallstones seen or evidence of acute cholecystitis. Pancreas: No mass or inflammatory process identified on this un-enhanced exam. Spleen: Within normal limits in size. Adrenals/Urinary Tract: Adrenal glands appear normal. Multiple renal cysts again noted bilaterally, unchanged in the short-term interval. 4 mm linear stone again noted within the lower pole right renal pelvis. Punctate stone again noted within the lower pole left kidney, more likely within the cortex with surrounding scarring as indicated on the previous report as well. No hydronephrosis bilaterally. There is some perinephric inflammation now seen about the upper pole of the  left kidney which was not present on the earlier exam raising the possibility of pyelonephritis. No ureteral or bladder calculi identified. Bladder walls again appear thickened circumferentially. Prostate gland is prominently enlarged, stable compared to the previous study with persistent mass effect on the bladder base. Per previous report, patient is status post TURP. Stomach/Bowel: Bowel is normal in caliber and configuration throughout. No bowel wall thickening or evidence of bowel wall inflammation seen. Scattered mild diverticulosis noted within the descending and sigmoid colon but no inflammatory change to suggest acute diverticulitis. Appendix is normal. Vascular/Lymphatic: Scattered atherosclerotic changes of the normal- caliber abdominal aorta. No enlarged lymph nodes seen within the abdomen or pelvis. Reproductive: Prominently enlarged prostate gland, as described above. No acute- appearing findings. Other: No free fluid or abscess collections seen. No free intraperitoneal air. Musculoskeletal: No acute or suspicious osseous lesions seen. Degenerative changes again noted throughout the thoracolumbar spine, fairly mild in degree. IMPRESSION: 1. Mild perinephric inflammation/fluid stranding about the upper pole of the left kidney which was not seen on earlier CT of 10/23/2015. This raises the possibility of a developing pyelonephritis, given the history of left flank pain, cloudy urine and dysuria. 2. Stable left renal cysts. 3. No obstructing renal or ureteral stone.  No hydronephrosis. 4. As on the previous study, bladder walls are circumferentially thickened suggesting chronic bladder outlet obstruction. Prostate gland is prominently enlarged, stable in the short-term interval, causing mass effect on the bladder base. Air is seen within the bladder which is presumably related to recent catheterization. If no history of recent catheterization, I could not exclude cystitis related to gas-forming organism.  5. Mild colonic diverticulosis without evidence of acute diverticulitis. Electronically Signed   By: Franki Cabot M.D.   On: 11/02/2015 14:25     Hillary Corinda Gubler, MD 11/02/2015, 5:03 PM PGY-1, [facility] Intern pager: [phone], text pages welcome  I have seen and examined the patient. I have read and agree with the above note. My changes are noted in blue.  Tawanna Sat, MD 11/02/2015, 7:11 PM PGY-3, [place] Intern Pager: [phone], text pages welcome

## 2015-11-02 NOTE — ED Notes (Signed)
Patient is resting comfortably. Family at bedside.  Call bell in reach.

## 2015-11-02 NOTE — ED Notes (Signed)
[facility] called for transport. 

## 2015-11-02 NOTE — ED Notes (Signed)
Pt with Hx of chronic kidney disease c/o left side pain radiating to left flank, cloudy urine, nausea, dysuria onset yesterday. Denies emesis.

## 2015-11-03 DIAGNOSIS — E1122 Type 2 diabetes mellitus with diabetic chronic kidney disease: Secondary | ICD-10-CM | POA: Diagnosis not present

## 2015-11-03 DIAGNOSIS — N400 Enlarged prostate without lower urinary tract symptoms: Secondary | ICD-10-CM | POA: Diagnosis not present

## 2015-11-03 DIAGNOSIS — N12 Tubulo-interstitial nephritis, not specified as acute or chronic: Secondary | ICD-10-CM | POA: Diagnosis not present

## 2015-11-03 DIAGNOSIS — N183 Chronic kidney disease, stage 3 (moderate): Secondary | ICD-10-CM | POA: Diagnosis not present

## 2015-11-03 LAB — CBC
HCT: 25 % — ABNORMAL LOW (ref 39.0–52.0)
HEMOGLOBIN: 8.6 g/dL — AB (ref 13.0–17.0)
MCH: 30.6 pg (ref 26.0–34.0)
MCHC: 34.4 g/dL (ref 30.0–36.0)
MCV: 89 fL (ref 78.0–100.0)
Platelets: 144 10*3/uL — ABNORMAL LOW (ref 150–400)
RBC: 2.81 MIL/uL — AB (ref 4.22–5.81)
RDW: 13.5 % (ref 11.5–15.5)
WBC: 8 10*3/uL (ref 4.0–10.5)

## 2015-11-03 LAB — BASIC METABOLIC PANEL
ANION GAP: 6 (ref 5–15)
BUN: 28 mg/dL — ABNORMAL HIGH (ref 6–20)
CALCIUM: 8.6 mg/dL — AB (ref 8.9–10.3)
CO2: 24 mmol/L (ref 22–32)
Chloride: 105 mmol/L (ref 101–111)
Creatinine, Ser: 2.06 mg/dL — ABNORMAL HIGH (ref 0.61–1.24)
GFR, EST AFRICAN AMERICAN: 38 mL/min — AB (ref >60)
GFR, EST NON AFRICAN AMERICAN: 33 mL/min — AB (ref >60)
Glucose, Bld: 274 mg/dL — ABNORMAL HIGH (ref 65–99)
Potassium: 4.5 mmol/L (ref 3.5–5.1)
SODIUM: 135 mmol/L (ref 135–145)

## 2015-11-03 LAB — GLUCOSE, CAPILLARY
GLUCOSE-CAPILLARY: 363 mg/dL — AB (ref 65–99)
GLUCOSE-CAPILLARY: 222 mg/dL — AB (ref 65–99)
GLUCOSE-CAPILLARY: 195 mg/dL — AB (ref 65–99)
GLUCOSE-CAPILLARY: 300 mg/dL — AB (ref 65–99)

## 2015-11-03 MED ORDER — INFLUENZA VAC SPLIT QUAD 0.5 ML IM SUSY
0.5000 mL | PREFILLED_SYRINGE | INTRAMUSCULAR | Status: DC
  Filled 2015-11-03 (×2): qty 0.5

## 2015-11-03 MED ORDER — PNEUMOCOCCAL VAC POLYVALENT 25 MCG/0.5ML IJ INJ
0.5000 mL | INJECTION | INTRAMUSCULAR | Status: DC
  Filled 2015-11-03: qty 0.5

## 2015-11-03 MED ORDER — OXYCODONE-ACETAMINOPHEN 5-325 MG PO TABS
1.0000 | ORAL_TABLET | Freq: Four times a day (QID) | ORAL | Status: DC | PRN
  Administered 2015-11-03 – 2015-11-06 (×8): 2 via ORAL
  Filled 2015-11-03 (×8): qty 2

## 2015-11-03 NOTE — Progress Notes (Signed)
Family Medicine Teaching Service Daily Progress Note Intern Pager: [phone]  Patient name: Richard Gill. Medical record number: [id number] Date of birth: Mar 14, 1956 Age: 59 y.o. Gender: male  Primary Care Provider: Chrisandra Netters, MD Consultants: none Code Status: Full  Pt Overview and Major Events to Date:  11/24: admitted, started on CTX  Assessment and Plan: Richard Gill. is a 59 y.o. male presenting with dysuria and left flank pain . PMH is significant for CKD, T2DM, BPH, HLD, sickle cell trait, MGUS.  Dysuria, left-sided flank pain, AKI on CKD: Possible pyelonephritis with perinephric inflammation seen at upper pole of left kidney on CT abdomen. 4 mm kidney stone present and could be contributing to pain but is within lower pole of right renal pelvis. Cr 1.91 on admission, baseline appears to be around 1.4. SCr increased slightly to 2.06. -urine cx -hold metformin -transition dilaudid to oral oxy-apap  T2DM: Well-controlled. On Lantus 18 units daily and 1,000 mg metformin BID. Last hgb A1c 7.0 on 02/22/15.  -Hold metformin due to AKI -Continue lantus but at half home dose (9 units) -SSI sensitive per protocol  Hyperlipidemia: stable -continue statin  BPH - continue tamsulosin  Anemia, normocytic: appears stable, last hgb in chart 09/07/13 is 8.4 and on admission 9.5. Most likely ACD in setting of CKD. Large hgb on UA but no other evidence of bleeding. - cbc tomorrow  FEN/GI: NS IVFs @ 75 mL/hr, Zofran Prophylaxis: None  Disposition: pending clinical improvement  Subjective:  States back "kidney" pain has resolved. Still having significant burning with urination. Denies any nausea/vomiting.   Objective: Temp:  [98.1 F (36.7 C)-99 F (37.2 C)] 99 F (37.2 C) (11/25 0429) Pulse Rate:  [73-96] 84 (11/25 0429) Resp:  [18-20] 19 (11/25 0429) BP: (114-149)/(73-85) 128/73 mmHg (11/25 0429) SpO2:  [94 %-99 %] 96 % (11/25 0429) Weight:  [152 lb 1.9 oz (69 kg)] 152  lb 1.9 oz (69 kg) (11/24 2054) Physical Exam: General: NAD Cardiovascular: RRR, normal s1s2, no murmurs, rubs or gallop. 2+ radial pulses bilaterally  Respiratory: clear to auscultation bilaterally, normal effot Abdomen: soft, mildly full lower abdomen, mild lower abdominal tenderness. Bowel sounds normal. Back: no CVA tenderness Extremities: no edema Neuro: alert, no deficits Psych: normal thought content and speech  Laboratory:  Recent Labs Lab 11/02/15 1302 11/03/15 0359  WBC 9.9 8.0  HGB 9.5* 8.6*  HCT 27.3* 25.0*  PLT 161 144*    Recent Labs Lab 11/02/15 1302 11/03/15 0359  NA 137 135  K 4.1 4.5  CL 106 105  CO2 20* 24  BUN 28* 28*  CREATININE 1.91* 2.06*  CALCIUM 9.5 8.6*  GLUCOSE 269* 274*    Imaging/Diagnostic Tests: None new  Leone Brand, MD 11/03/2015, 7:08 AM PGY-3, [place] Intern pager: [phone], text pages welcome

## 2015-11-03 NOTE — Discharge Summary (Signed)
[facility] Discharge Summary  Patient name: Richard Gill. Medical record number: [id number] Date of birth: 21-Jun-1956 Age: 59 y.o. Gender: male Date of Admission: 11/02/2015  Date of Discharge: 11/06/15 Admitting Physician: Richard La, MD  Primary Care Provider: Chrisandra Netters, MD Consultants: none  Indication for Hospitalization: UTI  Discharge Diagnoses/Problem List:  UTI with developing pyelonephritis on CT DM2 AKI on CKD BPH Normocytic Anemia  Disposition: home  Discharge Condition: improved/stable  Discharge Exam: Per progress note from day of discharge,   Brief Hospital Course:  Richard Gill. is a 59 y.o. male presenting with left flank pain, dysuria, and nausea and was found to have pyelonephritis. PMH is significant for CKD, T2DM, BPH with history of TURP and recently had a cystoscopy on 11/22, HLD, sickle cell trait, MGUS  Pyelonephritis/UTI: likely due to nephrolithiasis and/or BPH CT Renal stone study showed mild perinephric inflammation/fluid stranding about the upper pole of the left kidney concerning for developing pyelonephritis. Patient was afebrile and did not have leukocytosis on admission. UA showed large hemoglobin, large Leukocytes, negative nitrite, and was turbid in appearance. Urine culture showed >100,000 pan-sensitive E Coli. Patient was started on IV Ceftriaxone and eventually transitioned to PO Keflex. Patient's urologist was made aware and recommended to complete antibiotic therapy and follow up as an outpatient for evaluation of nephrolithiasis. Patient was discharged with PO Keflex for total 10 day course with last dose on 11/11/15. Patient was also discharged with Percocet PRN for pain. Patient noted of worsening urinary retention. Post void residual noted to be elevated at 445, however with good urine output. Repeat post void residual noted to be 490. Straight cath done which resulted in only 1-2 cc of urine with  clots per nursing. Foley was ordered which resulted in blood tinged urine. Patient was discharged with foley in place as he has urology appointment on 11/29 at [place] with his urologist.   AKI on CKD:  On admission Creatinine was elevated at 1.91. Baseline appeared to be around 1.4. Thought likely pre-renal as well as pos-renal with BPH. Home Metformin was held.    BPH: Tamsulosin was increased to 0.8mg    T2DM:  Patient was placed on half home dose of Lantus during hospitalization with SSI. Metformin was held due to AKI. Patient was discharged with home Lantus. Meformin was held at discharge due to AKI.   Issues for Follow Up:  1. Follow up with urology for bilateral renal stones after 10 days of antibiotic treatment 2. Consider repeat BMET to evaluate Kidney Function 3. Consider restarting home Metformin if kidney function has improved 4. Follow up urinary retention 5. Consider repeat hemoglobin/hematocrit. UA noted to have large hemoglobin and on day of discharge foley resulted in blood tinged urine.   Significant Procedures: none  Significant Labs and Imaging:   Recent Labs Lab 11/03/15 0359 11/04/15 0546 11/05/15 1110  WBC 8.0 5.2 3.6*  HGB 8.6* 8.5* 8.4*  HCT 25.0* 24.6* 24.0*  PLT 144* 141* 147*    Recent Labs Lab 11/02/15 1302 11/03/15 0359 11/04/15 0546 11/05/15 1110 11/06/15 0804  NA 137 135 137 136 140  K 4.1 4.5 4.2 4.2 4.4  CL 106 105 107 103 108  CO2 20* 24 24 24 25   GLUCOSE 269* 274* 230* 279* 182*  BUN 28* 28* 28* 27* 25*  CREATININE 1.91* 2.06* 1.91* 1.82* 1.86*  CALCIUM 9.5 8.6* 8.7* 8.9 8.7*   CT Renal Stone Study: IMPRESSION: 1. Mild perinephric inflammation/fluid stranding about  the upper pole of the left kidney which was not seen on earlier CT of 10/23/2015. This raises the possibility of a developing pyelonephritis, given the history of left flank pain, cloudy urine and dysuria. 2. Stable left renal cysts. 3. No obstructing renal or  ureteral stone. No hydronephrosis. 4. As on the previous study, bladder walls are circumferentially thickened suggesting chronic bladder outlet obstruction. Prostate gland is prominently enlarged, stable in the short-term interval, causing mass effect on the bladder base. Air is seen within the bladder which is presumably related to recent catheterization. If no history of recent catheterization, I could not exclude cystitis related to gas-forming organism. 5. Mild colonic diverticulosis without evidence of acute diverticulitis.  Results/Tests Pending at Time of Discharge: none  Discharge Medications:    Medication List    STOP taking these medications        metFORMIN 1000 MG tablet  Commonly known as:  GLUCOPHAGE      TAKE these medications        aspirin EC 81 MG tablet  Take 1 tablet (81 mg total) by mouth daily.     cephALEXin 500 MG capsule  Commonly known as:  KEFLEX  Take 1 capsule (500 mg total) by mouth every 12 (twelve) hours.     hydrOXYzine 25 MG tablet  Commonly known as:  ATARAX/VISTARIL  TAKE ONE TABLET BY MOUTH THREE TIMES DAILY AS NEEDED FOR  ITCHING  OR  HIVES     Insulin Glargine 100 UNIT/ML Solostar Pen  Commonly known as:  LANTUS SOLOSTAR  Inject 18 Units into the skin every morning.     ondansetron 4 MG tablet  Commonly known as:  ZOFRAN  Take 1 tablet (4 mg total) by mouth every 6 (six) hours as needed for nausea.     oxyCODONE-acetaminophen 5-325 MG tablet  Commonly known as:  PERCOCET/ROXICET  Take 1-2 tablets by mouth every 6 (six) hours as needed for severe pain.     PROSTATE Caps  Take 1 capsule by mouth 2 (two) times daily.     rosuvastatin 40 MG tablet  Commonly known as:  CRESTOR  Take 1 tablet (40 mg total) by mouth daily.     tamsulosin 0.4 MG Caps capsule  Commonly known as:  FLOMAX  Take 2 capsules (0.8 mg total) by mouth daily.        Discharge Instructions: Please refer to Patient Instructions section of EMR for full  details.  Patient was counseled important signs and symptoms that should prompt return to medical care, changes in medications, dietary instructions, activity restrictions, and follow up appointments.   Follow-Up Appointments: Follow-up Information    Follow up with Richard Netters, MD On 11/10/2015.   Specialty:  Family Medicine   Why:  at 8:45AM for hospital follow up for UTI/Pyelonephritis   Contact information:   [place] [address] [phone]       Please follow up.   Why:  Please keep your appointment with your urologist on 11/29 at [place], MD 11/06/2015, 4:14 PM PGY-1, [place]

## 2015-11-04 DIAGNOSIS — N179 Acute kidney failure, unspecified: Secondary | ICD-10-CM | POA: Diagnosis not present

## 2015-11-04 DIAGNOSIS — N200 Calculus of kidney: Secondary | ICD-10-CM

## 2015-11-04 DIAGNOSIS — E1122 Type 2 diabetes mellitus with diabetic chronic kidney disease: Secondary | ICD-10-CM | POA: Diagnosis not present

## 2015-11-04 DIAGNOSIS — N12 Tubulo-interstitial nephritis, not specified as acute or chronic: Secondary | ICD-10-CM | POA: Diagnosis not present

## 2015-11-04 LAB — URINE CULTURE

## 2015-11-04 LAB — CBC
HCT: 24.6 % — ABNORMAL LOW (ref 39.0–52.0)
HEMOGLOBIN: 8.5 g/dL — AB (ref 13.0–17.0)
MCH: 30.7 pg (ref 26.0–34.0)
MCHC: 34.6 g/dL (ref 30.0–36.0)
MCV: 88.8 fL (ref 78.0–100.0)
Platelets: 141 10*3/uL — ABNORMAL LOW (ref 150–400)
RBC: 2.77 MIL/uL — AB (ref 4.22–5.81)
RDW: 13.2 % (ref 11.5–15.5)
WBC: 5.2 10*3/uL (ref 4.0–10.5)

## 2015-11-04 LAB — BASIC METABOLIC PANEL
ANION GAP: 6 (ref 5–15)
BUN: 28 mg/dL — ABNORMAL HIGH (ref 6–20)
CALCIUM: 8.7 mg/dL — AB (ref 8.9–10.3)
CO2: 24 mmol/L (ref 22–32)
Chloride: 107 mmol/L (ref 101–111)
Creatinine, Ser: 1.91 mg/dL — ABNORMAL HIGH (ref 0.61–1.24)
GFR, EST AFRICAN AMERICAN: 42 mL/min — AB (ref >60)
GFR, EST NON AFRICAN AMERICAN: 36 mL/min — AB (ref >60)
GLUCOSE: 230 mg/dL — AB (ref 65–99)
Potassium: 4.2 mmol/L (ref 3.5–5.1)
Sodium: 137 mmol/L (ref 135–145)

## 2015-11-04 LAB — GLUCOSE, CAPILLARY
GLUCOSE-CAPILLARY: 230 mg/dL — AB (ref 65–99)
GLUCOSE-CAPILLARY: 265 mg/dL — AB (ref 65–99)
GLUCOSE-CAPILLARY: 194 mg/dL — AB (ref 65–99)
Glucose-Capillary: 251 mg/dL — ABNORMAL HIGH (ref 65–99)

## 2015-11-04 MED ORDER — CEPHALEXIN 250 MG PO CAPS
500.0000 mg | ORAL_CAPSULE | Freq: Two times a day (BID) | ORAL | Status: DC
  Administered 2015-11-04 – 2015-11-06 (×4): 500 mg via ORAL
  Filled 2015-11-04 (×6): qty 2

## 2015-11-04 NOTE — Progress Notes (Signed)
Family Medicine Teaching Service Daily Progress Note Intern Pager: [phone]  Patient name: Richard Gill. Medical record number: [id number] Date of birth: 06-08-1956 Age: 59 y.o. Gender: male  Primary Care Provider: Chrisandra Netters, MD Consultants: none Code Status: Full  Pt Overview and Major Events to Date:  11/24: admitted, started on CTX  Assessment and Plan: Richard Gill. is a 59 y.o. male presenting with dysuria and left flank pain . PMH is significant for CKD, T2DM, BPH, HLD, sickle cell trait, MGUS.  UTI, E. Coli; likely 2/2 nephrolithiasis and/or BPH: Possible pyelonephritis with perinephric inflammation seen at upper pole of left kidney on CT abdomen. 4 mm kidney stone present and could be contributing to pain but is within lower pole of right renal pelvis. Cr 1.91 on admission, baseline appears to be around 1.4. -urine cx: E. Coli >100,000 colonies - Transition from IV CTX to PO Keflex - Oxy for pain control  AKI: Cr 1.91 on admission, baseline appears to be around 1.4. - Today (11/26) Cr 1.91 - hold metformin - will monitor  T2DM: Well-controlled. On Lantus 18 units daily and 1,000 mg metformin BID. Last hgb A1c 7.0 on 02/22/15.  -Hold metformin due to AKI -Continue lantus but at half home dose (9 units) -SSI sensitive per protocol  Hyperlipidemia: stable -continue statin  BPH - continue tamsulosin  Anemia, normocytic: appears stable, last hgb in chart 09/07/13 is 8.4 and on admission 9.5. Most likely ACD in setting of CKD. Large hgb on UA but no other evidence of bleeding. - will monitor   FEN/GI: IVSL; heart healthy, Zofran Prophylaxis: None  Disposition: pending clinical improvement  Subjective:  No longer w/ flank pain. Still having significant burning with urination. Denies any nausea/vomiting.   Objective: Temp:  [98.2 F (36.8 C)-99.4 F (37.4 C)] 98.2 F (36.8 C) (11/26 0833) Pulse Rate:  [73-98] 98 (11/26 0833) Resp:  [17-18] 18 (11/26  0833) BP: (109-130)/(70-74) 130/74 mmHg (11/26 0833) SpO2:  [97 %-98 %] 98 % (11/26 0833) Weight:  [153 lb 6 oz (69.57 kg)] 153 lb 6 oz (69.57 kg) (11/25 2057) Physical Exam: General: NAD Cardiovascular: RRR. 2+ radial pulses bilaterally  Respiratory: clear to auscultation bilaterally, normal effot Abdomen: soft, mild lower abdominal tenderness. Bowel sounds normal. Back: no CVA tenderness Extremities: no edema Neuro: alert, no deficits Psych: normal thought content and speech  Laboratory:  Recent Labs Lab 11/02/15 1302 11/03/15 0359 11/04/15 0546  WBC 9.9 8.0 5.2  HGB 9.5* 8.6* 8.5*  HCT 27.3* 25.0* 24.6*  PLT 161 144* 141*    Recent Labs Lab 11/02/15 1302 11/03/15 0359 11/04/15 0546  NA 137 135 137  K 4.1 4.5 4.2  CL 106 105 107  CO2 20* 24 24  BUN 28* 28* 28*  CREATININE 1.91* 2.06* 1.91*  CALCIUM 9.5 8.6* 8.7*  GLUCOSE 269* 274* 230*    Imaging/Diagnostic Tests: None new  Elberta Leatherwood, MD 11/04/2015, 1:37 PM PGY-2, [place] Intern pager: [phone], text pages welcome

## 2015-11-05 DIAGNOSIS — N2889 Other specified disorders of kidney and ureter: Secondary | ICD-10-CM

## 2015-11-05 DIAGNOSIS — N401 Enlarged prostate with lower urinary tract symptoms: Secondary | ICD-10-CM | POA: Insufficient documentation

## 2015-11-05 DIAGNOSIS — N200 Calculus of kidney: Secondary | ICD-10-CM | POA: Diagnosis not present

## 2015-11-05 DIAGNOSIS — N179 Acute kidney failure, unspecified: Secondary | ICD-10-CM | POA: Diagnosis not present

## 2015-11-05 DIAGNOSIS — R338 Other retention of urine: Secondary | ICD-10-CM

## 2015-11-05 DIAGNOSIS — N12 Tubulo-interstitial nephritis, not specified as acute or chronic: Secondary | ICD-10-CM | POA: Diagnosis not present

## 2015-11-05 DIAGNOSIS — E1122 Type 2 diabetes mellitus with diabetic chronic kidney disease: Secondary | ICD-10-CM | POA: Diagnosis not present

## 2015-11-05 LAB — BASIC METABOLIC PANEL
ANION GAP: 9 (ref 5–15)
BUN: 27 mg/dL — AB (ref 6–20)
CALCIUM: 8.9 mg/dL (ref 8.9–10.3)
CO2: 24 mmol/L (ref 22–32)
Chloride: 103 mmol/L (ref 101–111)
Creatinine, Ser: 1.82 mg/dL — ABNORMAL HIGH (ref 0.61–1.24)
GFR calc Af Amer: 45 mL/min — ABNORMAL LOW (ref >60)
GFR, EST NON AFRICAN AMERICAN: 38 mL/min — AB (ref >60)
GLUCOSE: 279 mg/dL — AB (ref 65–99)
Potassium: 4.2 mmol/L (ref 3.5–5.1)
Sodium: 136 mmol/L (ref 135–145)

## 2015-11-05 LAB — CBC
HEMATOCRIT: 24 % — AB (ref 39.0–52.0)
HEMOGLOBIN: 8.4 g/dL — AB (ref 13.0–17.0)
MCH: 30.8 pg (ref 26.0–34.0)
MCHC: 35 g/dL (ref 30.0–36.0)
MCV: 87.9 fL (ref 78.0–100.0)
Platelets: 147 10*3/uL — ABNORMAL LOW (ref 150–400)
RBC: 2.73 MIL/uL — ABNORMAL LOW (ref 4.22–5.81)
RDW: 12.9 % (ref 11.5–15.5)
WBC: 3.6 10*3/uL — ABNORMAL LOW (ref 4.0–10.5)

## 2015-11-05 LAB — GLUCOSE, CAPILLARY
GLUCOSE-CAPILLARY: 223 mg/dL — AB (ref 65–99)
GLUCOSE-CAPILLARY: 246 mg/dL — AB (ref 65–99)
Glucose-Capillary: 284 mg/dL — ABNORMAL HIGH (ref 65–99)
Glucose-Capillary: 408 mg/dL — ABNORMAL HIGH (ref 65–99)

## 2015-11-05 MED ORDER — OXYCODONE-ACETAMINOPHEN 5-325 MG PO TABS
1.0000 | ORAL_TABLET | Freq: Four times a day (QID) | ORAL | Status: DC | PRN
Start: 2015-11-05 — End: 2016-11-18

## 2015-11-05 MED ORDER — ONDANSETRON HCL 4 MG PO TABS: 4.0000 mg | ORAL_TABLET | Freq: Four times a day (QID) | ORAL | Status: DC | PRN

## 2015-11-05 MED ORDER — TAMSULOSIN HCL 0.4 MG PO CAPS: 0.8000 mg | ORAL_CAPSULE | Freq: Every day | ORAL | Status: DC

## 2015-11-05 MED ORDER — SODIUM CHLORIDE 0.9 % IV SOLN
INTRAVENOUS | Status: AC
  Administered 2015-11-05: 17:00:00 via INTRAVENOUS

## 2015-11-05 MED ORDER — INSULIN ASPART 100 UNIT/ML ~~LOC~~ SOLN
7.0000 [IU] | Freq: Once | SUBCUTANEOUS | Status: AC
  Administered 2015-11-05: 7 [IU] via SUBCUTANEOUS

## 2015-11-05 MED ORDER — TAMSULOSIN HCL 0.4 MG PO CAPS
0.8000 mg | ORAL_CAPSULE | Freq: Every day | ORAL | Status: DC
  Administered 2015-11-05 – 2015-11-06 (×2): 0.8 mg via ORAL
  Filled 2015-11-05 (×2): qty 2

## 2015-11-05 MED ORDER — CEPHALEXIN 500 MG PO CAPS: 500.0000 mg | ORAL_CAPSULE | Freq: Two times a day (BID) | ORAL | Status: AC

## 2015-11-05 NOTE — Progress Notes (Signed)
Family Medicine Teaching Service Daily Progress Note Intern Pager: [phone]  Patient name: Richard Gill. Medical record number: [id number] Date of birth: 02/13/1956 Age: 59 y.o. Gender: male  Primary Care Provider: Chrisandra Netters, MD Consultants: none Code Status: Full  Pt Overview and Major Events to Date:  11/24: admitted, started on CTX 11/26: Urine culture results E. Coli - pan-sensitive > transitioned to PO Keflex BID total 10 day course 11/27: Pain resolved. Tolerating PO. Reduced UOP with PVR 440. Increased Flomax to 0.8  Assessment and Plan: Uriel Halbrooks. is a 59 y.o. male presenting with dysuria and left flank pain . PMH is significant for CKD, T2DM, BPH, HLD, sickle cell trait, MGUS.  Presumed pyelonephritis, E. Coli; likely 2/2 nephrolithiasis and/or BPH- Improved Possible pyelonephritis with perinephric inflammation seen at upper pole of left kidney on CT abdomen. 4 mm kidney stone present and could be contributing to pain but is within lower pole of right renal pelvis. Cr 1.91 on admission, baseline appears to be around 1.4. urine cx: E. Coli >100,000 colonies (pan sensitive) Clinically improved pyelo with afebrile x 48 hr, back pain resolved. Now some dysuria and hematuria. - Transition from IV CTX (11/24>11/26) to PO Keflex 500mg  BID x total 10 day course (last dose 11/11/15) - Oxy for pain control, stable - Increase Flomax to 0.8 daily (from 0.4), assist with BPH and possible kidney stone - Check PVR to rule out obstruction > results with PVR 445, with good UOP 300cc x 3 voids prior - Unclear if patient has some urinary retention at baseline, however given current course would recommend patient remain hospitalized overnight to follow UOP  AKI, Improving Suspected pre-renal, peak to Cr 2.0, baseline appears to be around 1.4 (>6 months ago, may have new baseline) - Improving Cr 1.9 to 1.82 - hold metformin - Tolerating PO well - Add gentle hydration NS 75cc/hr x 16  hour overnight - will monitor  T2DM: Well-controlled. On Lantus 18 units daily and 1,000 mg metformin BID. Last hgb A1c 7.0 on 02/22/15.  -Hold metformin due to AKI -Continue lantus but at half home dose (9 units) -SSI sensitive per protocol  Hyperlipidemia: stable -continue statin  BPH - continue tamsulosin  Anemia, normocytic: appears stable, last hgb in chart 09/07/13 is 8.4 and on admission 9.5. Most likely ACD in setting of CKD. Large hgb on UA but no other evidence of bleeding. - Stable with some hematuria, from underlying pyelo / nephrolithiasis - will monitor   FEN/GI: IVSL; heart healthy, gentle IVF 75 overnight Prophylaxis: None  Disposition: Tolerating PO antibiotics, afebrile, tolerating PO. Some urinary retention with PVR 440s, although voiding improved. Anticipate discharge to home tomorrow 11/28  Subjective:  Today feels better with resolved back and flank pain. Still complains of persistent dysuria and some hematuria. Increased urinary frequency from yesterday. Denies nausea / vomiting. Tolerating PO. Pain controlled. Able to ambulate. Voiding without difficulties.  Objective: Temp:  [98 F (36.7 C)-98.8 F (37.1 C)] 98.2 F (36.8 C) (11/27 0759) Pulse Rate:  [60-98] 60 (11/27 0759) Resp:  [16-19] 18 (11/27 0759) BP: (97-112)/(54-70) 112/67 mmHg (11/27 0759) SpO2:  [97 %-99 %] 97 % (11/27 0759) Weight:  [153 lb 2.8 oz (69.48 kg)] 153 lb 2.8 oz (69.48 kg) (11/26 2116) Physical Exam: General: well appearing, resting in bed, wife at bedside, comfortable, NAD Cardiovascular: RRR. 2+ radial pulses bilaterally  Respiratory: CTAB Abdomen: soft, non-distended, +active BS. Non-tender to palpation. Back: no CVA tenderness Extremities: no edema Neuro: awake, alert,  no deficits  Laboratory:  Recent Labs Lab 11/02/15 1302 11/03/15 0359 11/04/15 0546  WBC 9.9 8.0 5.2  HGB 9.5* 8.6* 8.5*  HCT 27.3* 25.0* 24.6*  PLT 161 144* 141*    Recent Labs Lab  11/02/15 1302 11/03/15 0359 11/04/15 0546  NA 137 135 137  K 4.1 4.5 4.2  CL 106 105 107  CO2 20* 24 24  BUN 28* 28* 28*  CREATININE 1.91* 2.06* 1.91*  CALCIUM 9.5 8.6* 8.7*  GLUCOSE 269* 274* 230*    Imaging/Diagnostic Tests:  Urine Culture - (11/02/15) - E. Coli (100k CFU) - pan sensitive  Olin Hauser, DO 11/05/2015, 8:43 AM PGY-3, [place] Intern pager: [phone], text pages welcome

## 2015-11-06 DIAGNOSIS — N12 Tubulo-interstitial nephritis, not specified as acute or chronic: Secondary | ICD-10-CM | POA: Diagnosis not present

## 2015-11-06 DIAGNOSIS — N200 Calculus of kidney: Secondary | ICD-10-CM | POA: Diagnosis not present

## 2015-11-06 DIAGNOSIS — E1122 Type 2 diabetes mellitus with diabetic chronic kidney disease: Secondary | ICD-10-CM | POA: Diagnosis not present

## 2015-11-06 DIAGNOSIS — N179 Acute kidney failure, unspecified: Secondary | ICD-10-CM | POA: Diagnosis not present

## 2015-11-06 LAB — BASIC METABOLIC PANEL
ANION GAP: 7 (ref 5–15)
BUN: 25 mg/dL — AB (ref 6–20)
CO2: 25 mmol/L (ref 22–32)
Calcium: 8.7 mg/dL — ABNORMAL LOW (ref 8.9–10.3)
Chloride: 108 mmol/L (ref 101–111)
Creatinine, Ser: 1.86 mg/dL — ABNORMAL HIGH (ref 0.61–1.24)
GFR calc Af Amer: 43 mL/min — ABNORMAL LOW (ref >60)
GFR calc non Af Amer: 37 mL/min — ABNORMAL LOW (ref >60)
GLUCOSE: 182 mg/dL — AB (ref 65–99)
POTASSIUM: 4.4 mmol/L (ref 3.5–5.1)
Sodium: 140 mmol/L (ref 135–145)

## 2015-11-06 LAB — GLUCOSE, CAPILLARY
GLUCOSE-CAPILLARY: 139 mg/dL — AB (ref 65–99)
Glucose-Capillary: 170 mg/dL — ABNORMAL HIGH (ref 65–99)
Glucose-Capillary: 251 mg/dL — ABNORMAL HIGH (ref 65–99)

## 2015-11-06 MED ORDER — INSULIN ASPART 100 UNIT/ML ~~LOC~~ SOLN: 7.0000 [IU] | Freq: Once | SUBCUTANEOUS | Status: AC

## 2015-11-06 MED ORDER — INSULIN GLARGINE 100 UNIT/ML ~~LOC~~ SOLN: 18.0000 [IU] | Freq: Every morning | SUBCUTANEOUS | Status: DC

## 2015-11-06 MED ORDER — TAMSULOSIN HCL 0.4 MG PO CAPS: 0.8000 mg | ORAL_CAPSULE | Freq: Every day | ORAL | Status: DC

## 2015-11-06 NOTE — Progress Notes (Signed)
11/06/2015 9:46 AM  PVR called to resident on call.  No orders received at this time. Princella Pellegrini

## 2015-11-06 NOTE — Discharge Instructions (Signed)
You were hospitalized with Urinary Tract Infection, considered Pyelonephritis. Also found to have a Kidney Stone. You were treated with IV antibiotics, and then switched to oral antibiotics with Keflex. Please pick up this rx at pharmacy and finish course, last day is 11/11/15 - take twice daily to finish bottle. You may take percocet as needed for pain, also may take tylenol. Please do NOT take any anti-inflammatories such as Advil, Ibuprofen, Aleve, Naproxen, Motrin - given your kidney injury, we will need to re-check your kidney function in the office within 1-2 weeks Drink plenty of fluids for now. You may pass the kidney stone, could have some blood in urine or stone fragments. Take Zofran as needed for nausea. - Please hold off taking metformin for now. You can discuss with your primary care provider about restarting this at the hospital follow up visit.   If you develop worsening pain, especially back pain and persistent nausea / vomiting, unable to take your antibiotic or pain medicine, worsening fevers, then please call our office and follow-up sooner. Also if you are unable to void / urinate, you need to contact us as this may be an emergency.   Pyelonephritis, Adult Pyelonephritis is a kidney infection. The kidneys are organs that help clean your blood by moving waste out of your blood and into your pee (urine). This infection can happen quickly, or it can last for a long time. In most cases, it clears up with treatment and does not cause other problems. HOME CARE Medicines  Take over-the-counter and prescription medicines only as told by your doctor.  Take your antibiotic medicine as told by your doctor. Do not stop taking the medicine even if you start to feel better. General Instructions  Drink enough fluid to keep your pee clear or pale yellow.  Avoid caffeine, tea, and carbonated drinks.  Pee (urinate) often. Avoid holding in pee for long periods of time.  Pee before and  after sex.  After pooping (having a bowel movement), women should wipe from front to back. Use each tissue only once.  Keep all follow-up visits as told by your doctor. This is important. GET HELP IF:  You do not feel better after 2 days.  Your symptoms get worse.  You have a fever. GET HELP RIGHT AWAY IF:  You cannot take your medicine or drink fluids as told.  You have chills and shaking.  You throw up (vomit).  You have very bad pain in your side (flank) or back.  You feel very weak or you pass out (faint).   This information is not intended to replace advice given to you by your health care provider. Make sure you discuss any questions you have with your health care provider.   Document Released: 01/02/2005 Document Revised: 08/16/2015 Document Reviewed: 03/20/2015 Elsevier Interactive Patient Education [employer].

## 2015-11-06 NOTE — Progress Notes (Signed)
Family Medicine Teaching Service Daily Progress Note Intern Pager: [phone]  Patient name: Richard Gill. Medical record number: [id number] Date of birth: 1956-07-20 Age: 59 y.o. Gender: male  Primary Care Provider: Chrisandra Netters, MD Consultants: none Code Status: Full  Pt Overview and Major Events to Date:  11/24: admitted, started on CTX 11/26: Urine culture results E. Coli - pan-sensitive > transitioned to PO Keflex BID total 10 day course 11/27: Pain resolved. Tolerating PO. Reduced UOP with PVR 440. Increased Flomax to 0.8  Assessment and Plan: Richard Gill. is a 59 y.o. male presenting with dysuria and left flank pain . PMH is significant for CKD, T2DM, BPH, HLD, sickle cell trait, MGUS.  Presumed pyelonephritis, E. Coli; likely 2/2 nephrolithiasis and/or BPH- Improved Possible pyelonephritis with perinephric inflammation seen at upper pole of left kidney on CT abdomen. 4 mm kidney stone present and could be contributing to pain but is within lower pole of right renal pelvis. Cr 1.91 on admission, baseline appears to be around 1.4. urine cx: E. Coli >100,000 colonies (pan sensitive) Clinically improved pyelo with afebrile x 48 hr, back pain resolved. Now some dysuria and hematuria. - Transition from IV CTX (11/24>11/26) to PO Keflex 500mg  BID x total 10 day course (last dose 11/11/15) - Oxy for pain control, stable - Increase Flomax to 0.8 daily (from 0.4), assist with BPH and possible kidney stone - PVR to rule out obstruction > results with PVR 490 with good UOP 1.5L x 1 unmeasured - straight cath: only 36mls with blood and clots this AM > will order foley    AKI, Improving Suspected pre-renal, peak to Cr 2.0, baseline appears to be around 1.4 (>6 months ago, may have new baseline) - Stable Cr 1.86 < 1.82 <1.9  - hold metformin - Tolerating PO well - will monitor  T2DM: Well-controlled. On Lantus 18 units daily and 1,000 mg metformin BID. Last hgb A1c 7.0 on 02/22/15.  CBGs: 223-408 (at bedtime); mainly in the 200s -Hold metformin due to AKI - will increase Lantus 18 (home dose) due to elevated CBGs.  -SSI sensitive per protocol  Hyperlipidemia: stable -continue statin  BPH - tamsulosin increased 0.8 (1/27)  Anemia, normocytic: stable, last hgb in chart 09/07/13 is 8.4 and on admission 9.5. Most likely ACD in setting of CKD. Large hgb on UA but no other evidence of bleeding. - Stable with some hematuria, from underlying pyelo / nephrolithiasis - will monitor  FEN/GI: IVSL; heart healthy, gentle IVF 75 overnight Prophylaxis: None  Disposition: Patient will need foley as only 69ml out with straight cath   Subjective:  - no concerns today. No fevers, chills, back pain - notes of dysuria that is chronic and has improved slightly   Objective: Temp:  [98.1 F (36.7 C)-98.7 F (37.1 C)] 98.1 F (36.7 C) (11/28 0521) Pulse Rate:  [60-82] 65 (11/28 0521) Resp:  [18-20] 18 (11/28 0521) BP: (106-116)/(59-71) 116/71 mmHg (11/28 0521) SpO2:  [97 %-100 %] 100 % (11/28 0521) Physical Exam: General: well appearing, resting in bed, comfortable, NAD Cardiovascular: RRR. 2+ radial pulses bilaterally  Respiratory: CTAB Abdomen: soft, non-distended, +active BS. Non-tender to palpation. Back: no CVA tenderness Extremities: no edema Neuro: awake, alert, no deficits  Laboratory:  Recent Labs Lab 11/03/15 0359 11/04/15 0546 11/05/15 1110  WBC 8.0 5.2 3.6*  HGB 8.6* 8.5* 8.4*  HCT 25.0* 24.6* 24.0*  PLT 144* 141* 147*    Recent Labs Lab 11/03/15 0359 11/04/15 0546 11/05/15 1110  NA 135  137 136  K 4.5 4.2 4.2  CL 105 107 103  CO2 24 24 24   BUN 28* 28* 27*  CREATININE 2.06* 1.91* 1.82*  CALCIUM 8.6* 8.7* 8.9  GLUCOSE 274* 230* 279*    Imaging/Diagnostic Tests:  Urine Culture - (11/02/15) - E. Coli (100k CFU) - pan sensitive  Smiley Houseman, MD 11/06/2015, 7:02 AM PGY-1, [place] Intern pager: [phone],  text pages welcome

## 2015-11-06 NOTE — Progress Notes (Signed)
Inpatient Diabetes Program Recommendations  AACE/ADA: New Consensus Statement on Inpatient Glycemic Control (2015)  Target Ranges:  Prepandial:   less than 140 mg/dL      Peak postprandial:   less than 180 mg/dL (1-2 hours)      Critically ill patients:  140 - 180 mg/dL    Results for MUNEEB, MISFELDT (MRN [id number]) as of 11/06/2015 08:20  Ref. Range 11/05/2015 07:50 11/05/2015 11:46 11/05/2015 16:49 11/05/2015 20:07  Glucose-Capillary Latest Ref Range: 65-99 mg/dL 223 (H) 246 (H) 284 (H) 408 (H)    Home DM Meds: Lantus 18 units QAM       Metformin 1000 mg bid  Current Insulin Orders: Lantus 9 units QAM      Novolog Sensitive SSI (0-9 units) TID AC + HS     MD- If patient is not discharged home today, please consider the following in-hospital insulin adjustments:  1. Increase Lantus to 12 units QAM  2. Start low dose Novolog Meal Coverage- Novolog 3 units tid with meals     --Will follow patient during hospitalization--  Wyn Quaker RN, MSN, CDE Diabetes Coordinator Inpatient Glycemic Control Team Team Pager: [phone] (8a-5p)

## 2015-11-06 NOTE — Progress Notes (Signed)
11/06/2015 1:07 PM  Attempted In and out cath per MD order.  Cath successfully placed in correct location, however, only a few cc's of bright red bloody urine with clots was present in the tube.  Only about 1-2cc drained from bladder.  Catheter appeared to have small blood clots present in tube.  Pt tolerated procedure well.  Abdomen is distended and patient c/o abdominal pressure to the lower abdomen. Bladder scan obtained with approximately 400cc urine in bladder.  Resident on call paged, no orders received as of yet.  Will monitor patient. Princella Pellegrini

## 2015-11-06 NOTE — Progress Notes (Signed)
11/06/2015 5:09 PM  Reviewed discharge paperwork with patient, including discharge instructions, follow up appointments, medications and new prescriptions.  I also reviewed information on pyelonephritis and provided education on how to care for a leg bag.  Patient and wife asked questions appropriately and verbalized understanding of information upon conclusion of discharge teaching.  I sent an extra leg bag home with the patient in case he were to need it.  IV removed.  Pt currently getting dressed and is to be escorted out via wheelchair.  Foley remains intact per MD order.  DC home with wife. Princella Pellegrini

## 2015-11-10 ENCOUNTER — Encounter: Payer: Self-pay | Admitting: Family Medicine

## 2015-11-10 ENCOUNTER — Ambulatory Visit (INDEPENDENT_AMBULATORY_CARE_PROVIDER_SITE_OTHER): Payer: BLUE CROSS/BLUE SHIELD | Admitting: Family Medicine

## 2015-11-10 VITALS — BP 133/70 | HR 67 | Temp 98.1°F | Ht 69.0 in | Wt 156.6 lb

## 2015-11-10 DIAGNOSIS — N12 Tubulo-interstitial nephritis, not specified as acute or chronic: Secondary | ICD-10-CM | POA: Diagnosis not present

## 2015-11-10 DIAGNOSIS — E119 Type 2 diabetes mellitus without complications: Secondary | ICD-10-CM | POA: Diagnosis not present

## 2015-11-10 LAB — BASIC METABOLIC PANEL
BUN: 23 mg/dL (ref 7–25)
CALCIUM: 8.8 mg/dL (ref 8.6–10.3)
CO2: 24 mmol/L (ref 20–31)
Chloride: 105 mmol/L (ref 98–110)
Creat: 1.49 mg/dL — ABNORMAL HIGH (ref 0.70–1.25)
GLUCOSE: 228 mg/dL — AB (ref 65–99)
POTASSIUM: 4.2 mmol/L (ref 3.5–5.3)
SODIUM: 138 mmol/L (ref 135–146)

## 2015-11-10 LAB — CBC
HCT: 26.3 % — ABNORMAL LOW (ref 39.0–52.0)
HEMOGLOBIN: 8.7 g/dL — AB (ref 13.0–17.0)
MCH: 29.7 pg (ref 26.0–34.0)
MCHC: 33.1 g/dL (ref 30.0–36.0)
MCV: 89.8 fL (ref 78.0–100.0)
MPV: 8.7 fL (ref 8.6–12.4)
Platelets: 277 10*3/uL (ref 150–400)
RBC: 2.93 MIL/uL — ABNORMAL LOW (ref 4.22–5.81)
RDW: 14.1 % (ref 11.5–15.5)
WBC: 5.8 10*3/uL (ref 4.0–10.5)

## 2015-11-10 LAB — POCT GLYCOSYLATED HEMOGLOBIN (HGB A1C): HEMOGLOBIN A1C: 6.9

## 2015-11-10 NOTE — Patient Instructions (Signed)
Checking kidney function today Also checking blood counts  I'll let you know whether you can resume the metformin  Please get an appointment with the urologist within the next week  We will need to do a repeat CT scan of your chest in 6-12 months  Be well, Dr. Ardelia Mems

## 2015-11-10 NOTE — Progress Notes (Signed)
Date of Visit: 11/10/2015   HPI:  Richard Gill. presents for hospital follow up. Patient was hospitalized from 11/02/15 to 11/06/15 with pyelonephritis. Urine culture grew pan sensitive e coli. Patient transitioned to PO keflex. Has tolerated this medication well. Reports he feels well now. Denies fevers, back pain, trouble with stooling. Urine output is normal. Of note he was discharged with a foley catheter in place due to high post void residuals in the mid to high 400's. Has had foley in place since Monday of this week. Eating and drinking well. Went to urology office on the 29th for a CT scan of his lungs to follow up on a pulmonary nodule seen on CT abdomen a few weeks prior. Has not seen actual urology MD yet and does not have appointment scheduled. Patient was waiting for blood in urine to stop prior to going to urology MD appointment, as he wanted to avoid having to remove the foley and have it reinserted. Thinks bleeding in urine has slowed down. Urine bag currently empty. Last had foley in place 2 years ago after having prostate surgery.    Of note patient had mild AKI in hospitalization so metformin was held upon discharge.   ROS: See HPI.  [place]: history of BPH, monoclonal gammopathy, CKD stage 2, type 2 diabetes, nephrolithiasis  PHYSICAL EXAM: BP 133/70 mmHg  Pulse 67  Temp(Src) 98.1 F (36.7 C) (Oral)  Ht 5\' 9"  (1.753 m)  Wt 156 lb 9.6 oz (71.033 kg)  BMI 23.12 kg/m2 Gen: NAD, pleasant, cooperative HEENT: NCAT Heart: regular rate and rhythm no murmur Lungs: clear to auscultation bilaterally, normal work of breathing  Neuro: alert, grossly nonfocal, speech normal Ext: atraumatic Back: no CVA tenderness Foley bag empty without any urine in place. ,no blood visible in bag.   ASSESSMENT/PLAN:  59 yo M presenting for hospital follow up after admission for pyelonephritis, with mild AKI and urinary retention. Doing well. - complete course of keflex - schedule follow up  visit with urologist, counseled to call for appointment ASAP, foley not to remain in place for >2 weeks without seeing urology - recheck CBC & BMET today, await labs before deciding on starting metformin - see me in 3 months for follow up of routine medical issues  FOLLOW UP: F/u in 3 months for chronic medical problems Schedule follow up visit with urology.  [place]. Ardelia Mems, [place]

## 2015-11-28 ENCOUNTER — Encounter: Payer: Self-pay | Admitting: Family Medicine

## 2015-12-02 ENCOUNTER — Other Ambulatory Visit: Payer: Self-pay | Admitting: Family Medicine

## 2015-12-04 NOTE — Telephone Encounter (Signed)
Red team, Can you call patient and ask what dose of lantus he is currently on? I think it fell off his medication list due to it automatically expiring in Epic and just want to be sure of the actual dose before I send it in. Once we have his present dose I (or Dr. Erin Hearing who is covering for me this week) will send it in.  Thanks, Leeanne Rio, MD

## 2015-12-05 NOTE — Telephone Encounter (Signed)
Left message on voicemail for patient to call back. 

## 2015-12-06 NOTE — Telephone Encounter (Signed)
Left another message on voicemail for patient to call back regarding medication.

## 2015-12-07 ENCOUNTER — Telehealth: Payer: Self-pay | Admitting: Family Medicine

## 2015-12-07 NOTE — Telephone Encounter (Signed)
Pt calling to check on status of this request. Pt states that he is on 20 units. Sadie Reynolds, ASA

## 2015-12-07 NOTE — Telephone Encounter (Signed)
Error. Addended other msg

## 2016-01-19 ENCOUNTER — Other Ambulatory Visit: Payer: Self-pay | Admitting: Family Medicine

## 2016-01-29 ENCOUNTER — Other Ambulatory Visit: Payer: Self-pay | Admitting: Family Medicine

## 2016-05-13 ENCOUNTER — Other Ambulatory Visit: Payer: Self-pay | Admitting: Family Medicine

## 2016-06-06 ENCOUNTER — Other Ambulatory Visit: Payer: Self-pay | Admitting: Internal Medicine

## 2016-06-06 DIAGNOSIS — R911 Solitary pulmonary nodule: Secondary | ICD-10-CM

## 2016-06-07 ENCOUNTER — Other Ambulatory Visit: Payer: Self-pay | Admitting: *Deleted

## 2016-06-14 ENCOUNTER — Inpatient Hospital Stay: Admission: RE | Admit: 2016-06-14 | Payer: BLUE CROSS/BLUE SHIELD | Source: Ambulatory Visit

## 2016-07-15 ENCOUNTER — Ambulatory Visit
Admission: RE | Admit: 2016-07-15 | Discharge: 2016-07-15 | Disposition: A | Discharge status: Home / Self Care | Payer: BLUE CROSS/BLUE SHIELD | Source: Ambulatory Visit | Attending: Internal Medicine | Admitting: Internal Medicine

## 2016-07-15 DIAGNOSIS — R911 Solitary pulmonary nodule: Secondary | ICD-10-CM

## 2016-07-15 MED ORDER — IOPAMIDOL (ISOVUE-300) INJECTION 61%
75.0000 mL | Freq: Once | INTRAVENOUS | Status: AC | PRN
  Administered 2016-07-15: 75 mL via INTRAVENOUS

## 2016-07-22 ENCOUNTER — Other Ambulatory Visit: Payer: Self-pay | Admitting: Family Medicine

## 2016-07-22 ENCOUNTER — Encounter: Payer: Self-pay | Admitting: Family Medicine

## 2016-07-22 ENCOUNTER — Ambulatory Visit (INDEPENDENT_AMBULATORY_CARE_PROVIDER_SITE_OTHER): Payer: BLUE CROSS/BLUE SHIELD | Admitting: Family Medicine

## 2016-07-22 VITALS — BP 144/78 | HR 72 | Temp 98.3°F | Ht 69.0 in | Wt 156.2 lb

## 2016-07-22 DIAGNOSIS — Z1159 Encounter for screening for other viral diseases: Secondary | ICD-10-CM | POA: Diagnosis not present

## 2016-07-22 DIAGNOSIS — E119 Type 2 diabetes mellitus without complications: Secondary | ICD-10-CM

## 2016-07-22 LAB — POCT GLYCOSYLATED HEMOGLOBIN (HGB A1C): Hemoglobin A1C: 8.9

## 2016-07-22 MED ORDER — METFORMIN HCL 1000 MG PO TABS: 1000.0000 mg | ORAL_TABLET | Freq: Two times a day (BID) | ORAL | 1 refills | Status: DC

## 2016-07-22 MED ORDER — INSULIN PEN NEEDLE 31G X 6 MM MISC: 5 refills | Status: DC

## 2016-07-22 MED ORDER — ZOSTER VACCINE LIVE 19400 UNT/0.65ML ~~LOC~~ SUSR: 0.6500 mL | Freq: Once | SUBCUTANEOUS | 0 refills | Status: DC

## 2016-07-22 MED ORDER — ROSUVASTATIN CALCIUM 40 MG PO TABS: 40.0000 mg | ORAL_TABLET | Freq: Every day | ORAL | 1 refills | Status: DC

## 2016-07-22 MED ORDER — GLUCOSE BLOOD VI STRP
ORAL_STRIP | 12 refills | Status: DC
Start: 2016-07-22 — End: 2018-09-29

## 2016-07-22 NOTE — Progress Notes (Signed)
Date of Visit: 07/22/2016   HPI:  Richard Gill presents for routine follow up of diabetes.  Diabetes - taking lantus 20 units at night. Most of his fasting sugars run 120s-130s. Sugars go higher after eating. Also takes metformin, but is not on any mealtime insulin coverage. Due for eye exam, plans to schedule this.  Urination - had been hospitalized for pyelo & urinary retention. Reports urinating well now.  ROS: See HPI.  [place]: history of urinary retention, pyelonephritis in December, type 2 diabetes with insulin therapy, hyperlipidemia, monoclonal gammopathy  PHYSICAL EXAM: BP (!) 144/78   Pulse 72   Temp 98.3 F (36.8 C) (Oral)   Ht 5\' 9"  (1.753 m)   Wt 156 lb 3.2 oz (70.9 kg)   BMI 23.07 kg/m  Gen: NAD, pleasant, cooperative HEENT: normocephalic, atraumatic, moist mucous membranes  Heart: regular rate and rhythm, no murmur Lungs: clear to auscultation bilaterally, normal work of breathing  Neuro: alert grossly nonfocal, speech normal Ext: No appreciable lower extremity edema bilaterally. Diabetic foot exam: 2+ DP pulses bilat, normal monofilament testing bilaterally. No lesions or significant calluses.   ASSESSMENT/PLAN:  Health maintenance:  -declined pneumovax today -instructed to schedule eye appointment -check hepatitis C antibody today with labs  T2DM (type 2 diabetes mellitus) Uncontrolled with increase in A1c to 8.9 (last visit 6.9). Suspect will need mealtime insulin coverage. Discussed this with patient today, but he prefers to work on nutrition for 3 months and then decide whether to add mealtime insulin. Gave him nutritionist contact info, he will call Dr. Jenne Campus to schedule.  Cardiac: on statin, aspirin Renal: check urine microalbumin & renal function today Eye: instructed to schedule Foot: normal foot exam today Immunizations: declines pneumovax. 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   FOLLOW UP: Follow up in 3 mos for  diabetes Schedule eye appointment   Richard Gill, [place]

## 2016-07-22 NOTE — Assessment & Plan Note (Signed)
Uncontrolled with increase in A1c to 8.9 (last visit 6.9). Suspect will need mealtime insulin coverage. Discussed this with patient today, but he prefers to work on nutrition for 3 months and then decide whether to add mealtime insulin. Gave him nutritionist contact info, he will call Dr. Jenne Campus to schedule.  Cardiac: on statin, aspirin Renal: check urine microalbumin & renal function today Eye: instructed to schedule Foot: normal foot exam today Immunizations: declines pneumovax. 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

## 2016-07-22 NOTE — Patient Instructions (Addendum)
For nutritionist: Call Dr. Jenne Campus (our nutritionist) to set up an appointment. Her phone number is: [phone].   Checking labs: kidneys, liver, urine protein, cholesterol, hepatitis C  Schedule your yearly eye appointment   Follow up with me in 3 months   Be well, Dr. Ardelia Mems

## 2016-07-22 NOTE — Progress Notes (Signed)
error 

## 2016-07-23 LAB — COMPLETE METABOLIC PANEL WITH GFR
ALT: 18 U/L (ref 9–46)
AST: 16 U/L (ref 10–35)
Albumin: 4.4 g/dL (ref 3.6–5.1)
Alkaline Phosphatase: 106 U/L (ref 40–115)
BILIRUBIN TOTAL: 0.6 mg/dL (ref 0.2–1.2)
BUN: 22 mg/dL (ref 7–25)
CALCIUM: 9.5 mg/dL (ref 8.6–10.3)
CHLORIDE: 104 mmol/L (ref 98–110)
CO2: 25 mmol/L (ref 20–31)
Creat: 1.42 mg/dL — ABNORMAL HIGH (ref 0.70–1.25)
GFR, EST AFRICAN AMERICAN: 61 mL/min (ref >=60)
GFR, EST NON AFRICAN AMERICAN: 53 mL/min — AB (ref >=60)
Glucose, Bld: 235 mg/dL — ABNORMAL HIGH (ref 65–99)
Potassium: 4.6 mmol/L (ref 3.5–5.3)
Sodium: 137 mmol/L (ref 135–146)
Total Protein: 7.8 g/dL (ref 6.1–8.1)

## 2016-07-23 LAB — LIPID PANEL
CHOLESTEROL: 194 mg/dL (ref 125–200)
HDL: 44 mg/dL (ref >=40)
LDL CALC: 132 mg/dL — AB (ref <130)
TRIGLYCERIDES: 89 mg/dL (ref <150)
Total CHOL/HDL Ratio: 4.4 Ratio (ref <=5.0)
VLDL: 18 mg/dL (ref <30)

## 2016-07-23 LAB — MICROALBUMIN / CREATININE URINE RATIO
Creatinine, Urine: 68 mg/dL (ref 20–370)
MICROALB UR: 14.5 mg/dL
MICROALB/CREAT RATIO: 213 ug/mg{creat} — AB (ref <30)

## 2016-07-23 LAB — HEPATITIS C ANTIBODY: HCV Ab: NEGATIVE

## 2016-08-05 ENCOUNTER — Telehealth: Payer: Self-pay | Admitting: Family Medicine

## 2016-08-05 DIAGNOSIS — R809 Proteinuria, unspecified: Secondary | ICD-10-CM

## 2016-08-05 NOTE — Telephone Encounter (Signed)
Called patient to discuss labs. No answer on cell, no voicemail set up. No answer on home, left voicemail there asking him to call back.  Summary of labs: - cholesterol looks good. Continue crestor. - urine has protein in it, which is due to his diabetes. Needs lisinopril again, appears this was stopped during his hospitalization in November of 2016. Once he starts it, needs to follow up in 1 week for recheck of kidney function to be sure kidneys are tolerating it.  Once I know he's heard this message, I will send in prescription for lisinopril. If he has questions about the above message I am happy to speak with him directly.  Leeanne Rio, MD

## 2016-08-15 NOTE — Telephone Encounter (Signed)
Red team, do you mind trying to reach patient with the message below? Thanks, Leeanne Rio, MD

## 2016-08-16 NOTE — Telephone Encounter (Signed)
LMOVM for pt to call us back. Deseree Blount, CMA  

## 2016-08-22 NOTE — Telephone Encounter (Signed)
Left message for patient to return call.

## 2016-08-23 NOTE — Telephone Encounter (Signed)
We still have not heard back from patient. I will send him a letter asking him to call us.  Leeanne Rio, MD

## 2016-08-29 LAB — HM DIABETES EYE EXAM

## 2016-08-30 ENCOUNTER — Encounter: Payer: Self-pay | Admitting: Family Medicine

## 2016-09-05 MED ORDER — LISINOPRIL 5 MG PO TABS: 5.0000 mg | ORAL_TABLET | Freq: Every day | ORAL | 0 refills | Status: DC

## 2016-09-05 NOTE — Telephone Encounter (Signed)
rx sent in & BMET ordered.  Leeanne Rio, MD

## 2016-09-05 NOTE — Addendum Note (Signed)
Addended by: Leeanne Rio on: 09/05/2016 12:56 PM   Modules accepted: Orders

## 2016-09-05 NOTE — Telephone Encounter (Signed)
Patient informed of message from MD, is agreeable to restarting lisinopril. Would like MD to send rx to [facility]

## 2016-09-16 ENCOUNTER — Other Ambulatory Visit: Payer: BLUE CROSS/BLUE SHIELD

## 2016-09-16 DIAGNOSIS — R809 Proteinuria, unspecified: Secondary | ICD-10-CM

## 2016-09-16 LAB — BASIC METABOLIC PANEL WITH GFR
BUN: 26 mg/dL — AB (ref 7–25)
CO2: 25 mmol/L (ref 20–31)
Calcium: 9.2 mg/dL (ref 8.6–10.3)
Chloride: 102 mmol/L (ref 98–110)
Creat: 1.65 mg/dL — ABNORMAL HIGH (ref 0.70–1.25)
GFR, EST AFRICAN AMERICAN: 51 mL/min — AB (ref >=60)
GFR, Est Non African American: 44 mL/min — ABNORMAL LOW (ref >=60)
GLUCOSE: 301 mg/dL — AB (ref 65–99)
POTASSIUM: 4.4 mmol/L (ref 3.5–5.3)
Sodium: 136 mmol/L (ref 135–146)

## 2016-10-04 ENCOUNTER — Telehealth: Payer: Self-pay | Admitting: Family Medicine

## 2016-10-04 NOTE — Telephone Encounter (Signed)
Called patient to discuss bmet Less than 30% increase in creatinine -w hich is appropriate for being on lisinopril, but now means that we need to stop metformin. He will continue lantus and stop metformin. Patient appreciative.  Leeanne Rio, MD

## 2016-10-25 ENCOUNTER — Other Ambulatory Visit: Payer: Self-pay | Admitting: Family Medicine

## 2016-11-14 DIAGNOSIS — R972 Elevated prostate specific antigen [PSA]: Secondary | ICD-10-CM | POA: Diagnosis not present

## 2016-11-18 ENCOUNTER — Ambulatory Visit (INDEPENDENT_AMBULATORY_CARE_PROVIDER_SITE_OTHER): Payer: Medicare Other | Admitting: Family Medicine

## 2016-11-18 ENCOUNTER — Telehealth: Payer: Self-pay | Admitting: Family Medicine

## 2016-11-18 ENCOUNTER — Ambulatory Visit (INDEPENDENT_AMBULATORY_CARE_PROVIDER_SITE_OTHER): Payer: Medicare Other | Admitting: Pharmacist

## 2016-11-18 ENCOUNTER — Encounter: Payer: Self-pay | Admitting: Pharmacist

## 2016-11-18 VITALS — BP 140/82 | HR 64 | Temp 98.0°F | Wt 161.0 lb

## 2016-11-18 DIAGNOSIS — E119 Type 2 diabetes mellitus without complications: Secondary | ICD-10-CM

## 2016-11-18 DIAGNOSIS — E785 Hyperlipidemia, unspecified: Secondary | ICD-10-CM

## 2016-11-18 DIAGNOSIS — R338 Other retention of urine: Secondary | ICD-10-CM

## 2016-11-18 DIAGNOSIS — N401 Enlarged prostate with lower urinary tract symptoms: Secondary | ICD-10-CM

## 2016-11-18 LAB — POCT GLYCOSYLATED HEMOGLOBIN (HGB A1C): HEMOGLOBIN A1C: 8.6

## 2016-11-18 MED ORDER — LISINOPRIL 5 MG PO TABS: 5.0000 mg | ORAL_TABLET | Freq: Every day | ORAL | 1 refills | Status: DC

## 2016-11-18 MED ORDER — METFORMIN HCL 500 MG PO TABS: 500.0000 mg | ORAL_TABLET | Freq: Two times a day (BID) | ORAL | 2 refills | Status: DC

## 2016-11-18 MED ORDER — INSULIN GLARGINE 100 UNIT/ML SOLOSTAR PEN: 20.0000 [IU] | PEN_INJECTOR | Freq: Every day | SUBCUTANEOUS | 3 refills | Status: DC

## 2016-11-18 NOTE — Patient Instructions (Signed)
Start Metformin 500mg  twice daily.   Start Lantus 20 units daily in the morning.   Weekly,  Increase your dose by 1 unit if your blood sugars remain > 100.   Follow up in 5-6 weeks in Pharmacy Clinic.

## 2016-11-18 NOTE — Progress Notes (Signed)
Date of Visit: 11/18/2016   HPI:  Patient presents for routine follow up.  Diabetes - taking lantus 20 units daily. Fasting sugars typically run 300s in the morning. Gets lower sugars (as low as 86) at the end of the day. Has stopped metformin since I called him and advised stopping, though he took one pill this morning because he was concerned that sugar was running high. Denies chest pain, shortness of breath, swelling.   Microalbuminuria - had previously been started on lisinopril 5mg  daily for microalbuminuria. He took this for 1 month and then it ran out so he didn't continue it.  Prostate - doing well, recently followed up with urologist. Taking flomax 0.4mg  daily.  Hyperlipidemia - taking crestor 40mg  daily. Tolerating this well.  ROS: See HPI.  [place]: history of type 2 diabetes, BPH, hyperlipidemia, monoclonal gammopathy  PHYSICAL EXAM: BP 140/82   Pulse 64   Temp 98 F (36.7 C) (Oral)   Wt 161 lb (73 kg)   BMI 23.78 kg/m  Gen: NAD, pleasant, cooperative, well appearing HEENT: normocephalic, atraumatic, moist mucous membranes  Heart: regular rate and rhythm, no murmur Lungs: clear to auscultation bilaterally normal work of breathing  Neuro: alert grossly nonfocal, speech normal Ext: No appreciable lower extremity edema bilaterally   ASSESSMENT/PLAN:  Health maintenance:  -will obtain records from diabetic eye exam ([facility]) -declines flu shot indefinitely this season -declines pneumovax today, will consider at next visit  T2DM (type 2 diabetes mellitus) A1c improved slightly at 8.6 (down from 8.9). Admits to some dietary indiscretions. Now just on lantus, though has some low sugars later in the day with high fastings. Slightly confusing picture. Patient agreeable to seeing Dr. Valentina Lucks for medication recommendations. It is possible Dr. Valentina Lucks will recommend going back on metformin with monitoring of renal function. (note - realized after visit that  patient was actually able to see Dr. Valentina Lucks today, see his separate note for details).  Cardiac: on statin, aspirin Renal: restart lisinopril 5mg  daily for microalbuminuria, to have renal function checked with labs at hematology visit next week Eye: will get records Foot: UTD Immunizations: declines pneumovax for now, agrees to postpone discussion for 3 months. Declines flu vaccine outright today.   Hyperlipidemia LDL 132 four months ago. Continue crestor 40mg  daily, tolerating well.   Urinary retention due to benign prostatic hyperplasia Continues to follow with urology, doing well per patient.   FOLLOW UP: Follow up in 3 mos for diabetes Schedule appointment with Dr. Annalee Genta. Ardelia Mems, [place]

## 2016-11-18 NOTE — Progress Notes (Signed)
    S:    Patient arrives in good spirits.  Presents for diabetes evaluation, education, and management at the request of Dr. Ardelia Mems. Patient was referred today after visit with PCP.     Patient reports Diabetes was diagnosed in 2007.  He states he has taken insulin for ~ 3 years.   He had stopped metformin and is now willing to consider restarting this medication.   Patient reports adherence with medications.  Current diabetes medications include: Lantus 20 units daily (usually at night). Current hypertension medications - none - starting lisinopril today.   Patient denies recent hypoglycemic events. However he does report moderate to severe hypoglycemic reactions while asleep (awoken by his dogs) in the past when his blood sugar was under better control.   Patient reported exercise habits vary with work (he works doing Administrator, [employer]).    He verbalizes an A1c goal of < 7 as his goal.   O:  Lab Results  Component Value Date   HGBA1C 8.6 11/18/2016   Home fasting CBG: > 150 routinely.  2 hour post-prandial/random CBG: > 200s  Scr elevated 1.4-1.6 recently.    A/P: Diabetes longstanding currently with poor control related to dietary indiscretion and less busy days working. Patient denies hypoglycemic events and is able to verbalize appropriate hypoglycemia management plan. Patient reports adherence with medication.  Willing and interested in restarting metformin.   Agreed to restart at 500mg  BID (lower than in the past) and then reevaluate CBGs and Scr.   Adjusted dose of basal insulin Lantus (insulin glargine) to AM and provided instruction to continue to titrate 1 unit/WEEK if fasting CBGs > 100mg /dl until fasting CBGs reach goal or next visit with me in 5-6 weeks.  Also discussed potential to initiate alternatve agents versus prandial inuslin including SGT2 (Jardiance and Invokana).   Reevaluate BP, BMET and CBGs at follow up in 5-6 weeks.   Written patient instructions provided.   Total time in face to face counseling 35 minutes.

## 2016-11-18 NOTE — Assessment & Plan Note (Signed)
A1c improved slightly at 8.6 (down from 8.9). Admits to some dietary indiscretions. Now just on lantus, though has some low sugars later in the day with high fastings. Slightly confusing picture. Patient agreeable to seeing Dr. Valentina Lucks for medication recommendations. It is possible Dr. Valentina Lucks will recommend going back on metformin with monitoring of renal function. (note - realized after visit that patient was actually able to see Dr. Valentina Lucks today, see his separate note for details).  Cardiac: on statin, aspirin Renal: restart lisinopril 5mg  daily for microalbuminuria, to have renal function checked with labs at hematology visit next week Eye: will get records Foot: UTD Immunizations: declines pneumovax for now, agrees to postpone discussion for 3 months. Declines flu vaccine outright today.

## 2016-11-18 NOTE — Assessment & Plan Note (Signed)
Continues to follow with urology, doing well per patient.

## 2016-11-18 NOTE — Telephone Encounter (Signed)
error 

## 2016-11-18 NOTE — Patient Instructions (Addendum)
It was great to see you again today!  Please schedule an appointment with Dr. Valentina Lucks, our pharmacist, to talk about managing your diabetes.  We'll get the records from your eye exam  Restart lisinopril - sent this in for you. Ask Dr. Melba Coon to check your kidney function next week with your labs. Call me if he isn't able to do this.  Work on healthy eating - see handout below.  Follow up with me in 3 months.  Be well, Dr. Ardelia Mems   Diabetes Mellitus and Food It is important for you to manage your blood sugar (glucose) level. Your blood glucose level can be greatly affected by what you eat. Eating healthier foods in the appropriate amounts throughout the day at about the same time each day will help you control your blood glucose level. It can also help slow or prevent worsening of your diabetes mellitus. Healthy eating may even help you improve the level of your blood pressure and reach or maintain a healthy weight. General recommendations for healthful eating and cooking habits include:  Eating meals and snacks regularly. Avoid going long periods of time without eating to lose weight.  Eating a diet that consists mainly of plant-based foods, such as fruits, vegetables, nuts, legumes, and whole grains.  Using low-heat cooking methods, such as baking, instead of high-heat cooking methods, such as deep frying. Work with your dietitian to make sure you understand how to use the Nutrition Facts information on food labels. How can food affect me? Carbohydrates  Carbohydrates affect your blood glucose level more than any other type of food. Your dietitian will help you determine how many carbohydrates to eat at each meal and teach you how to count carbohydrates. Counting carbohydrates is important to keep your blood glucose at a healthy level, especially if you are using insulin or taking certain medicines for diabetes mellitus. Alcohol  Alcohol can cause sudden decreases in blood glucose  (hypoglycemia), especially if you use insulin or take certain medicines for diabetes mellitus. Hypoglycemia can be a life-threatening condition. Symptoms of hypoglycemia (sleepiness, dizziness, and disorientation) are similar to symptoms of having too much alcohol. If your health care provider has given you approval to drink alcohol, do so in moderation and use the following guidelines:  Women should not have more than one drink per day, and men should not have more than two drinks per day. One drink is equal to:  12 oz of beer.  5 oz of wine.  1 oz of hard liquor.  Do not drink on an empty stomach.  Keep yourself hydrated. Have water, diet soda, or unsweetened iced tea.  Regular soda, juice, and other mixers might contain a lot of carbohydrates and should be counted. What foods are not recommended? As you make food choices, it is important to remember that all foods are not the same. Some foods have fewer nutrients per serving than other foods, even though they might have the same number of calories or carbohydrates. It is difficult to get your body what it needs when you eat foods with fewer nutrients. Examples of foods that you should avoid that are high in calories and carbohydrates but low in nutrients include:  Trans fats (most processed foods list trans fats on the Nutrition Facts label).  Regular soda.  Juice.  Candy.  Sweets, such as cake, pie, doughnuts, and cookies.  Fried foods. What foods can I eat? Eat nutrient-rich foods, which will nourish your body and keep you healthy. The food  you should eat also will depend on several factors, including:  The calories you need.  The medicines you take.  Your weight.  Your blood glucose level.  Your blood pressure level.  Your cholesterol level. You should eat a variety of foods, including:  Protein.  Lean cuts of meat.  Proteins low in saturated fats, such as fish, egg whites, and beans. Avoid processed  meats.  Fruits and vegetables.  Fruits and vegetables that may help control blood glucose levels, such as apples, mangoes, and yams.  Dairy products.  Choose fat-free or low-fat dairy products, such as milk, yogurt, and cheese.  Grains, bread, pasta, and rice.  Choose whole grain products, such as multigrain bread, whole oats, and brown rice. These foods may help control blood pressure.  Fats.  Foods containing healthful fats, such as nuts, avocado, olive oil, canola oil, and fish. Does everyone with diabetes mellitus have the same meal plan? Because every person with diabetes mellitus is different, there is not one meal plan that works for everyone. It is very important that you meet with a dietitian who will help you create a meal plan that is just right for you. This information is not intended to replace advice given to you by your health care provider. Make sure you discuss any questions you have with your health care provider. Document Released: 08/22/2005 Document Revised: 05/02/2016 Document Reviewed: 10/22/2013 Elsevier Interactive Patient Education  2017 [employer].

## 2016-11-18 NOTE — Assessment & Plan Note (Signed)
LDL 132 four months ago. Continue crestor 40mg  daily, tolerating well.

## 2016-11-18 NOTE — Assessment & Plan Note (Addendum)
Diabetes longstanding currently with poor control related to dietary indiscretion and less busy days working. Patient denies hypoglycemic events and is able to verbalize appropriate hypoglycemia management plan. Patient reports adherence with medication.  Willing and interested in restarting metformin. Agreed to restart at 500mg  BID (lower than in the past) and then reevaluate CBGs and Scr.  Adjusted dose of basal insulin Lantus (insulin glargine) to AM and provided instruction to continue to titrate 1 unit/WEEK if fasting CBGs > 100mg /dl until fasting CBGs reach goal or next visit with me in 5-6 weeks.  Also discussed potential to initiate alternatve agents versus prandial inuslin including SGT2 (Jardiance and Invokana).   Reevaluate BP, BMET and CBGs at follow up in 5-6 weeks.

## 2016-11-18 NOTE — Progress Notes (Signed)
Patient ID: Richard Gill., male   DOB: 1956/11/13, 60 y.o.   MRN: [id number] Reviewed: Agree with Dr. Graylin Shiver documentation and management.

## 2016-11-25 DIAGNOSIS — C9000 Multiple myeloma not having achieved remission: Secondary | ICD-10-CM | POA: Diagnosis not present

## 2016-12-23 ENCOUNTER — Telehealth: Payer: Self-pay | Admitting: Family Medicine

## 2016-12-23 NOTE — Telephone Encounter (Signed)
-----   Message from Leeanne Rio, MD sent at 11/18/2016  7:08 PM EST ----- Need diabetic eye exam - he had this at: [facility]  Thanks! Leeanne Rio, MD

## 2016-12-23 NOTE — Telephone Encounter (Signed)
I have requested a DM eye exam report from  [facility]. Their office will fax the report to our office.    [place]

## 2017-01-17 ENCOUNTER — Telehealth: Payer: Self-pay | Admitting: Family Medicine

## 2017-01-17 NOTE — Telephone Encounter (Signed)
Needs refill on his hydroxene. He threw the bottle away and doesn't have the number. [facility] on [address]

## 2017-01-24 MED ORDER — HYDROXYZINE HCL 25 MG PO TABS: 25.0000 mg | ORAL_TABLET | Freq: Every day | ORAL | 3 refills | Status: DC | PRN

## 2017-01-24 NOTE — Telephone Encounter (Signed)
Called patient to discuss this request He reports taking hydroxyzine just as needed for itching on his legs Denies history of angioedema in the past (important as he's on lisinopril) Will refill hydroxyzine  Leeanne Rio, MD

## 2017-03-27 ENCOUNTER — Ambulatory Visit (INDEPENDENT_AMBULATORY_CARE_PROVIDER_SITE_OTHER): Payer: Medicare HMO | Admitting: Family Medicine

## 2017-03-27 ENCOUNTER — Encounter: Payer: Self-pay | Admitting: Family Medicine

## 2017-03-27 VITALS — BP 144/72 | HR 65 | Temp 98.1°F | Ht 69.0 in | Wt 158.6 lb

## 2017-03-27 DIAGNOSIS — M5442 Lumbago with sciatica, left side: Secondary | ICD-10-CM

## 2017-03-27 DIAGNOSIS — M25552 Pain in left hip: Secondary | ICD-10-CM | POA: Diagnosis not present

## 2017-03-27 DIAGNOSIS — E119 Type 2 diabetes mellitus without complications: Secondary | ICD-10-CM | POA: Diagnosis not present

## 2017-03-27 DIAGNOSIS — M545 Low back pain, unspecified: Secondary | ICD-10-CM | POA: Insufficient documentation

## 2017-03-27 DIAGNOSIS — G8929 Other chronic pain: Secondary | ICD-10-CM | POA: Diagnosis not present

## 2017-03-27 DIAGNOSIS — R109 Unspecified abdominal pain: Secondary | ICD-10-CM

## 2017-03-27 LAB — POCT URINALYSIS DIP (MANUAL ENTRY)
Bilirubin, UA: NEGATIVE
Blood, UA: NEGATIVE
Glucose, UA: NEGATIVE mg/dL
Ketones, POC UA: NEGATIVE mg/dL
LEUKOCYTES UA: NEGATIVE
NITRITE UA: NEGATIVE
Spec Grav, UA: 1.015 (ref 1.010–1.025)
UROBILINOGEN UA: 0.2 U/dL
pH, UA: 5.5 (ref 5.0–8.0)

## 2017-03-27 LAB — POCT GLYCOSYLATED HEMOGLOBIN (HGB A1C): HEMOGLOBIN A1C: 7

## 2017-03-27 NOTE — Patient Instructions (Signed)
It was nice to see you today.  Back Pain - likely arthritis, however will check Xray to make sure you do not have lesions from Multiple Myeloma   Left Hip pain - likely coming from the back, get xrays from Dr. Philipp Ovens  Diabetes: A1C of 7.0 today. Start taking lantus 15 units daily. Continue to check AM blood sugars and send to Dr. Ardelia Mems via [place] so that she can adjust your insulin.

## 2017-03-27 NOTE — Assessment & Plan Note (Addendum)
Chronic low back pain with radicular symptoms to LE. No red flag signs/symptoms. Likely OA however patient does have a history of multiple myeloma.  -check Lumbar films to rule out lytic lesion.  -will recommend treatment based on xray results.

## 2017-03-27 NOTE — Progress Notes (Signed)
Subjective:    Patient ID: Richard Gill., male    DOB: 09-18-1956, 61 y.o.   MRN: [id number]  HPI 61 y/o male presents for evaluation of left low back/hip pain. PMH includes Nephrolithiasis, BPH, Urinary Retention due to BPH, Multiple Myeloma, and Pyelonephritis.   Left Low back and Hip Pain  Hip - pain 5/6, dull ache, worse with moving, takes Tylenol prn which does not help symptoms, pain now affecting sleep, lateral aspect of hip, no radiation to groin, present for a year  Back - "soreness", bilateral, constant during the day, aggravated with movement, previously seen at [facility] and had xrays of lumbar spine a few years ago (unable to locate records in [facility]).  Diabetes Takes Metformin 500 mg twice daily Lantus - takes anywhere from 5 - 25 units (told to start at 20 at pharmacy visit and increase by one unit daily), he changes his insulin dose based on AM numbers (blood sugars have been running 100's to low 200's), reports that he started changing Lantus dose when he had a low a few weeks ago  Multiple Myeloma Follows with Dr. Philipp Ovens, had xrays of left hip a few months ago (no available in [place])  Social Former Smoker  Review of Systems See Above    Objective:   Physical Exam BP (!) 144/72   Pulse 65   Temp 98.1 F (36.7 C) (Oral)   Ht _0  (1.753 m)   Wt 158 lb 9.6 oz (71.9 kg)   SpO2 99%   BMI 23.42 kg/m    Gen: pleasant male, NAD Cardiac: RRR, S1 and S2 present, no murmur Resp: CTAB, normal effort MSK: lumbar - minimal midline or paraspinal tenderness, negative bilateral SLT; Left hip - no tenderness over lateral hip/greater trochanter Neuro: strength 5/5 in bilateral LE, sensation to light touch intact in all extremities, 2+ patellar reflexes bilaterally, no foot clonus    CT 2016 IMPRESSION: 1. Mild perinephric inflammation/fluid stranding about the upper pole of the left kidney which was not seen on earlier CT of 10/23/2015. This raises the  possibility of a developing pyelonephritis, given the history of left flank pain, cloudy urine and dysuria. 2. Stable left renal cysts. 3. No obstructing renal or ureteral stone.  No hydronephrosis. 4. As on the previous study, bladder walls are circumferentially thickened suggesting chronic bladder outlet obstruction. Prostate gland is prominently enlarged, stable in the short-term interval, causing mass effect on the bladder base. Air is seen within the bladder which is presumably related to recent catheterization. If no history of recent catheterization, I could not exclude cystitis related to gas-forming organism. 5. Mild colonic diverticulosis without evidence of acute diverticulitis.   POC A1C 8.6 UA negative except for protein    Assessment & Plan:  T2DM (type 2 diabetes mellitus) Improved. A1C 7.0. Patient confused on how to take Lantus (had been changing AM dose based on AM CBG's).  -counseled patient to take 15 units daily and to not change dose -patient to use MyChart to send AM values to Dr. Ardelia Mems in 1-2 weeks so that she can adjust Lantus -continue Metformin (last eGFR of 51 in 09/2016) - may need to stop based on future renal function -proteinuria noted on UA however already on Lisinopril   Low back pain Chronic low back pain with radicular symptoms to LE. No red flag signs/symptoms. Likely OA however patient does have a history of multiple myeloma.  -check Lumbar films to rule out lytic lesion.  -will recommend  treatment based on xray results.   Lateral pain of left hip Clinically suspect radicular symptoms from back as no point tenderness over greater trochanter.  -attempt to get records from xrays completed by Dr. Philipp Ovens

## 2017-03-27 NOTE — Assessment & Plan Note (Signed)
Clinically suspect radicular symptoms from back as no point tenderness over greater trochanter.  -attempt to get records from xrays completed by Dr. Philipp Ovens

## 2017-03-27 NOTE — Assessment & Plan Note (Signed)
Improved. A1C 7.0. Patient confused on how to take Lantus (had been changing AM dose based on AM CBG's).  -counseled patient to take 15 units daily and to not change dose -patient to use MyChart to send AM values to Dr. Ardelia Mems in 1-2 weeks so that she can adjust Lantus -continue Metformin (last eGFR of 51 in 09/2016) - may need to stop based on future renal function -proteinuria noted on UA however already on Lisinopril

## 2017-03-28 ENCOUNTER — Telehealth: Payer: Self-pay | Admitting: Family Medicine

## 2017-03-28 DIAGNOSIS — D472 Monoclonal gammopathy: Secondary | ICD-10-CM

## 2017-03-28 NOTE — Telephone Encounter (Signed)
Completed further chart review after office visit on 4/19.   Reviewed office note from Dr. Melba Coon on 11/25/16 (Media Section) Known stable IgG Lamba Smoldering Multiple Myeloma Per note patient has had multiple MRI/Imaging studies that have previously identified ?lytic lesions of bilateral femur/humeri that have been stable.   MRI bilateral Proximal LE 06/19/2013 The marrow proximal femur is heterogenous. No evidence for fracture, stress fracture. There is a 1.5 cm T2 bright/T1 isodense to muscle lesion in the left posterior femoral head and similar lesion in the left greater trochanter measuring 6 mm. No lesions in the right femur were noted.   06/15/2013 SPEP showed elevated monoclonal IgG lambda (more specific results in Oncology note in Media Section)  07/09/2013 PET-CT Lucent lesion in the posterior aspect of the left femoral head does not show increased FDG uptake. No FDG avid osseous lesions identified.   07/09/2013 Bone Marrow, right iliac, aspiration and biopsy: Normocellular bone marrow (30%) with mild plasmacytosis (11% plasma cell by manual aspirate differential)  MRI 01/05/2014 The 2 small lesions in the left proximal femur are grossly unchanged. No new lesion are seen. Mild nonspecific bone marrow heterogeneity, which may be secondary to myeloma, treat, or red marrow conversion.   Skeletal Survey 11/30/2014 Unchanged lucent lesion in the appendicular skeleton, most apparent in the right proximal femur. No pathologic fracture.    I contacted the patient and asked him to contact the office of Dr. Melba Coon and have them send over his previous imaging studies (xrays and MRI).  I have discussed this with the patient's PCP Dr. Wilnette Kales MD

## 2017-05-08 DIAGNOSIS — C9000 Multiple myeloma not having achieved remission: Secondary | ICD-10-CM | POA: Diagnosis not present

## 2017-05-12 DIAGNOSIS — R972 Elevated prostate specific antigen [PSA]: Secondary | ICD-10-CM | POA: Diagnosis not present

## 2017-05-14 ENCOUNTER — Other Ambulatory Visit: Payer: Self-pay

## 2017-05-14 DIAGNOSIS — R911 Solitary pulmonary nodule: Secondary | ICD-10-CM

## 2017-06-18 ENCOUNTER — Other Ambulatory Visit: Payer: Self-pay | Admitting: Internal Medicine

## 2017-06-18 DIAGNOSIS — R911 Solitary pulmonary nodule: Secondary | ICD-10-CM

## 2017-06-19 ENCOUNTER — Other Ambulatory Visit: Payer: Medicare HMO

## 2017-06-25 ENCOUNTER — Ambulatory Visit
Admission: RE | Admit: 2017-06-25 | Discharge: 2017-06-25 | Disposition: A | Discharge status: Home / Self Care | Payer: Medicare HMO | Source: Ambulatory Visit | Attending: Internal Medicine | Admitting: Internal Medicine

## 2017-06-25 DIAGNOSIS — R911 Solitary pulmonary nodule: Secondary | ICD-10-CM

## 2017-09-03 ENCOUNTER — Other Ambulatory Visit: Payer: Self-pay | Admitting: Family Medicine

## 2017-09-08 ENCOUNTER — Other Ambulatory Visit: Payer: Self-pay | Admitting: Family Medicine

## 2017-09-19 ENCOUNTER — Other Ambulatory Visit: Payer: Self-pay | Admitting: *Deleted

## 2017-09-19 MED ORDER — METFORMIN HCL 1000 MG PO TABS: 1000.0000 mg | ORAL_TABLET | Freq: Two times a day (BID) | ORAL | 0 refills | Status: DC

## 2017-09-19 NOTE — Telephone Encounter (Signed)
Patient made first available appt with PCP (10/10/2017) L. Silvano Rusk, RN, BSN

## 2017-09-19 NOTE — Addendum Note (Signed)
Addended by: Valerie Roys on: 09/19/2017 02:48 PM   Modules accepted: Orders

## 2017-09-19 NOTE — Telephone Encounter (Signed)
Error with script and it didn't go through to pharmacy.  Resent medication. Bettyjo Lundblad,CMA

## 2017-09-24 DIAGNOSIS — Z0100 Encounter for examination of eyes and vision without abnormal findings: Secondary | ICD-10-CM | POA: Diagnosis not present

## 2017-09-24 DIAGNOSIS — H5210 Myopia, unspecified eye: Secondary | ICD-10-CM | POA: Diagnosis not present

## 2017-09-24 LAB — HM DIABETES EYE EXAM

## 2017-09-30 ENCOUNTER — Telehealth: Payer: Self-pay | Admitting: Family Medicine

## 2017-09-30 NOTE — Telephone Encounter (Signed)
Metformin called in to [place] at [place]. Hubbard Hartshorn, RN, BSN

## 2017-09-30 NOTE — Telephone Encounter (Signed)
Wife called because [facility] still has not received his Metformin. We sent this on 09/19/17 but the transmission failed. He is out of medication. Can we call this in today jw

## 2017-10-10 ENCOUNTER — Encounter: Payer: Self-pay | Admitting: Family Medicine

## 2017-10-10 ENCOUNTER — Ambulatory Visit (INDEPENDENT_AMBULATORY_CARE_PROVIDER_SITE_OTHER): Payer: Medicare HMO | Admitting: Family Medicine

## 2017-10-10 VITALS — BP 120/62 | HR 83 | Temp 97.9°F | Ht 69.0 in | Wt 159.0 lb

## 2017-10-10 DIAGNOSIS — N400 Enlarged prostate without lower urinary tract symptoms: Secondary | ICD-10-CM

## 2017-10-10 DIAGNOSIS — E119 Type 2 diabetes mellitus without complications: Secondary | ICD-10-CM

## 2017-10-10 DIAGNOSIS — E785 Hyperlipidemia, unspecified: Secondary | ICD-10-CM | POA: Diagnosis not present

## 2017-10-10 LAB — POCT GLYCOSYLATED HEMOGLOBIN (HGB A1C): HEMOGLOBIN A1C: 6

## 2017-10-10 MED ORDER — INSULIN GLARGINE 100 UNIT/ML SOLOSTAR PEN: 12.0000 [IU] | PEN_INJECTOR | Freq: Every day | SUBCUTANEOUS | Status: DC

## 2017-10-10 NOTE — Assessment & Plan Note (Addendum)
A1c today 6.0.  Control is too heavy.  Will lower Lantus to 12 units daily.  Patient will continue checking sugars and call if he has continued low sugars.  Follow-up in 3 months for recheck of A1c.  Of note, patient received a letter from his insurance saying that they will no longer cover Lantus in 2019.  Additional options are basaglar [employer], levemir flextouch, tresiba flextouch, according to the letter given to him by his insurance.  Patient will call insurance company to figure out which of these will be cheapest.  He will let me know and we will switch him to a comparable dose.  Cardiac: On statin, aspirin, check lipids today Renal: On ACE for renal protection Eye:  will request records from eye doctor Foot: Normal exam today Immunizations: Patient declined both Pneumovax and flu vaccine today, citing his fear of needles.

## 2017-10-10 NOTE — Assessment & Plan Note (Signed)
Update lipids and complete metabolic panel today.

## 2017-10-10 NOTE — Patient Instructions (Addendum)
It was great to see you again today!  Decrease lantus to 15 units daily Call me with which insulin we need to switch you to for your insurance.  Call for an appointment for flu shot & pneumonia shot if you decide to get these.  Checking labwork today  Continue other medications   Follow up with me in 3 months   Be well, Dr. Ardelia Mems

## 2017-10-10 NOTE — Assessment & Plan Note (Signed)
He follows with urology for this.

## 2017-10-10 NOTE — Progress Notes (Signed)
Date of Visit: 10/10/2017   HPI:  Patient presents for routine follow up.    Diabetes: Currently taking Lantus 15 units daily.  He does sometimes lower the dose of his sugar is low.  He also takes metformin.  He ran out of metformin recently and during that time his sugars ran from the 150s-200s in the morning while fasting.  When he is on metformin his sugars tend to be around 120 fasting.  He recently restarted metformin after getting a prescription.  He has had occasional low sugars once every few months.  The lowest it has gotten was 46.  He is not typically symptomatic when he has a low sugar.  He has worked greatly over the last 6 months to a year on glycemic control with his diet.  Hyperlipidemia: Currently taking Crestor 40 mg daily.  Tolerating this well.  No chest pain or shortness of breath.  BPH: Followed by urology for BPH.  Takes Flomax 0.4 mg daily  ROS: See HPI.  Richard Gill: History of type 2 diabetes, BPH, hyperlipidemia, monoclonal gammopathy, nephrolithiasis  PHYSICAL EXAM: BP 120/62   Pulse 83   Temp 97.9 F (36.6 C) (Oral)   Ht 5\' 9"  (1.753 m)   Wt 159 lb (72.1 kg)   SpO2 96%   BMI 23.48 kg/m  Gen: No acute distress, pleasant, cooperative HEENT: Normocephalic, atraumatic Heart: Regular rate and rhythm, no murmur Lungs: Clear to auscultation bilaterally, normal effort Neuro: Grossly nonfocal, speech normal Ext: Normal diabetic foot exam bilaterally, see quality metrics section for details  ASSESSMENT/PLAN:  Health maintenance:  -Discussed flu shot with patient at length today, but he declines -Also declines Pneumovax today.  States he is afraid of needles. -Normal foot exam today -We will request records from eye doctor ([place] [facility])  T2DM (type 2 diabetes mellitus) A1c today 6.0.  Control is too heavy.  Will lower Lantus to 12 units daily.  Patient will continue checking sugars and call if he has continued low sugars.  Follow-up in 3 months for  recheck of A1c.  Of note, patient received a letter from his insurance saying that they will no longer cover Lantus in 2019.  Additional options are basaglar [employer], levemir flextouch, tresiba flextouch, according to the letter given to him by his insurance.  Patient will call insurance company to figure out which of these will be cheapest.  He will let me know and we will switch him to a comparable dose.  Cardiac: On statin, aspirin, check lipids today Renal: On ACE for renal protection Eye:  will request records from eye doctor Foot: Normal exam today Immunizations: Patient declined both Pneumovax and flu vaccine today, citing his fear of needles.   Hyperlipidemia Update lipids and complete metabolic panel today.  BPH (benign prostatic hyperplasia) He follows with urology for this.  FOLLOW UP: Follow up in 3 months for above issues  Richard Gill, [place]

## 2017-10-11 LAB — CMP14+EGFR
ALT: 11 IU/L (ref 0–44)
AST: 19 IU/L (ref 0–40)
Albumin/Globulin Ratio: 1.5 (ref 1.2–2.2)
Albumin: 4.6 g/dL (ref 3.6–4.8)
Alkaline Phosphatase: 109 IU/L (ref 39–117)
BUN/Creatinine Ratio: 14 (ref 10–24)
BUN: 25 mg/dL (ref 8–27)
Bilirubin Total: 0.5 mg/dL (ref 0.0–1.2)
CALCIUM: 8.9 mg/dL (ref 8.6–10.2)
CO2: 19 mmol/L — AB (ref 20–29)
CREATININE: 1.75 mg/dL — AB (ref 0.76–1.27)
Chloride: 107 mmol/L — ABNORMAL HIGH (ref 96–106)
GFR calc Af Amer: 47 mL/min/{1.73_m2} — ABNORMAL LOW (ref >59)
GFR, EST NON AFRICAN AMERICAN: 41 mL/min/{1.73_m2} — AB (ref >59)
GLOBULIN, TOTAL: 3.1 g/dL (ref 1.5–4.5)
Glucose: 84 mg/dL (ref 65–99)
Potassium: 4.9 mmol/L (ref 3.5–5.2)
SODIUM: 138 mmol/L (ref 134–144)
TOTAL PROTEIN: 7.7 g/dL (ref 6.0–8.5)

## 2017-10-11 LAB — LIPID PANEL
CHOL/HDL RATIO: 5.3 ratio — AB (ref 0.0–5.0)
Cholesterol, Total: 192 mg/dL (ref 100–199)
HDL: 36 mg/dL — AB (ref >39)
LDL CALC: 144 mg/dL — AB (ref 0–99)
TRIGLYCERIDES: 61 mg/dL (ref 0–149)
VLDL Cholesterol Cal: 12 mg/dL (ref 5–40)

## 2017-10-13 ENCOUNTER — Encounter: Payer: Self-pay | Admitting: Family Medicine

## 2017-10-27 DIAGNOSIS — R6889 Other general symptoms and signs: Secondary | ICD-10-CM | POA: Diagnosis not present

## 2017-10-27 DIAGNOSIS — D472 Monoclonal gammopathy: Secondary | ICD-10-CM | POA: Diagnosis not present

## 2017-10-27 DIAGNOSIS — C9000 Multiple myeloma not having achieved remission: Secondary | ICD-10-CM | POA: Diagnosis not present

## 2017-11-12 DIAGNOSIS — R972 Elevated prostate specific antigen [PSA]: Secondary | ICD-10-CM | POA: Diagnosis not present

## 2017-11-24 ENCOUNTER — Other Ambulatory Visit: Payer: Self-pay | Admitting: Internal Medicine

## 2017-11-24 DIAGNOSIS — R911 Solitary pulmonary nodule: Secondary | ICD-10-CM

## 2017-11-28 ENCOUNTER — Other Ambulatory Visit: Payer: Self-pay | Admitting: Internal Medicine

## 2017-11-28 ENCOUNTER — Ambulatory Visit
Admission: RE | Admit: 2017-11-28 | Discharge: 2017-11-28 | Disposition: A | Discharge status: Home / Self Care | Payer: Medicare HMO | Source: Ambulatory Visit | Attending: Internal Medicine | Admitting: Internal Medicine

## 2017-11-28 DIAGNOSIS — R911 Solitary pulmonary nodule: Secondary | ICD-10-CM

## 2017-12-08 ENCOUNTER — Other Ambulatory Visit: Payer: Self-pay | Admitting: Family Medicine

## 2017-12-08 MED ORDER — INSULIN GLARGINE 100 UNIT/ML SOLOSTAR PEN: 12.0000 [IU] | PEN_INJECTOR | Freq: Every day | SUBCUTANEOUS | 0 refills | Status: DC

## 2017-12-10 DIAGNOSIS — R972 Elevated prostate specific antigen [PSA]: Secondary | ICD-10-CM | POA: Diagnosis not present

## 2017-12-10 DIAGNOSIS — N400 Enlarged prostate without lower urinary tract symptoms: Secondary | ICD-10-CM | POA: Diagnosis not present

## 2017-12-15 ENCOUNTER — Other Ambulatory Visit: Payer: Self-pay | Admitting: Family Medicine

## 2018-03-23 ENCOUNTER — Other Ambulatory Visit: Payer: Self-pay | Admitting: Family Medicine

## 2018-03-26 NOTE — Telephone Encounter (Signed)
Pt informed and scheduled. Deseree Blount, CMA  

## 2018-03-26 NOTE — Telephone Encounter (Signed)
Please ask patient to schedule an appointment with me, thanks Leeanne Rio, MD

## 2018-04-20 DIAGNOSIS — C9000 Multiple myeloma not having achieved remission: Secondary | ICD-10-CM | POA: Diagnosis not present

## 2018-04-27 ENCOUNTER — Other Ambulatory Visit: Payer: Self-pay | Admitting: Family Medicine

## 2018-05-01 ENCOUNTER — Encounter: Payer: Self-pay | Admitting: Family Medicine

## 2018-05-01 ENCOUNTER — Other Ambulatory Visit: Payer: Self-pay

## 2018-05-01 ENCOUNTER — Ambulatory Visit (INDEPENDENT_AMBULATORY_CARE_PROVIDER_SITE_OTHER): Payer: Medicare HMO | Admitting: Family Medicine

## 2018-05-01 VITALS — BP 124/78 | HR 91 | Temp 98.0°F | Ht 69.0 in | Wt 150.0 lb

## 2018-05-01 DIAGNOSIS — Z23 Encounter for immunization: Secondary | ICD-10-CM

## 2018-05-01 DIAGNOSIS — E119 Type 2 diabetes mellitus without complications: Secondary | ICD-10-CM | POA: Diagnosis not present

## 2018-05-01 LAB — POCT GLYCOSYLATED HEMOGLOBIN (HGB A1C): HbA1c, POC (controlled diabetic range): 6.5 % (ref 0.0–7.0)

## 2018-05-01 MED ORDER — INSULIN GLARGINE 100 UNIT/ML SOLOSTAR PEN: 10.0000 [IU] | PEN_INJECTOR | Freq: Every day | SUBCUTANEOUS | Status: DC

## 2018-05-01 NOTE — Progress Notes (Signed)
Date of Visit: 05/01/2018   HPI:  Patient presents for routine follow up.  Diabetes - currently taking lantus 12 units daily and metformin 1000mg  twice daily. Tolerating these both well. Has been eating healthier. Has had some low sugars as low as 40 one time about 2 months ago. Other times his fastings run between 80-100. Eye exam is reportedly current but we have not gotten records, patient says he can call his eye doctor to have records sent to [place].  Immunizations: agreeable to getting prevnar today. Needs pneumovax 2 months after prevnar, then pneumovax q5 years according to hematologist.  ROS: See HPI.  [place]: history of type 2 diabetes, BPH, hyperlipidemia, monoclonal gammopathy, CKD  PHYSICAL EXAM: BP 124/78 (BP Location: Right Arm, Patient Position: Sitting, Cuff Size: Normal)   Pulse 91   Temp 98 F (36.7 C) (Oral)   Ht 5\' 9"  (1.753 m)   Wt 150 lb (68 kg)   SpO2 98%   BMI 22.15 kg/m  Gen: no acute distress, pleasant, cooperative, well appearing HEENT: normocephalic, atraumatic, moist mucous membranes  Heart: regular rate and rhythm, no murmur Lungs: clear to auscultation bilaterally, normal work of breathing  Neuro: alert, grossly nonfocal, speech normal Ext: No appreciable lower extremity edema bilaterally   ASSESSMENT/PLAN:  Health maintenance:  -given info on scheduling colonoscopy -patient to contact eye doctor and have them send [place] records -prevnar given today, follow up in 2 months for RN visit and pneumovax  T2DM (type 2 diabetes mellitus) (HCC) A1c 6.5, excellent control. Fasting sugars running a little too low at 80-100. Will decrease lantus to 10 units daily. Check renal function today. On lisinopril for renal protection. Patient to call and get eye records sent to [place]. Prevnar given today as requested by his hematologist for monoclonal gammopathy.  FOLLOW UP: Follow up in 3 months for above issues  [name], [place]

## 2018-05-01 NOTE — Progress Notes (Signed)
POC4 

## 2018-05-01 NOTE — Patient Instructions (Signed)
Go down to 10 units daily on lantus. Stay on other medications A1c looks great - keep up the good work. Rechecking kidneys today.  Call eye Dr. And ask them to send [place] records Schedule colonoscopy  First pneumonia shot today, return for nurse visit in 2 months for next one  See me in 3 months/early August.  Be well, Dr. Ardelia Mems

## 2018-05-02 LAB — BASIC METABOLIC PANEL
BUN / CREAT RATIO: 16 (ref 10–24)
BUN: 33 mg/dL — AB (ref 8–27)
CHLORIDE: 110 mmol/L — AB (ref 96–106)
CO2: 20 mmol/L (ref 20–29)
Calcium: 8.6 mg/dL (ref 8.6–10.2)
Creatinine, Ser: 2.02 mg/dL — ABNORMAL HIGH (ref 0.76–1.27)
GFR calc non Af Amer: 34 mL/min/{1.73_m2} — ABNORMAL LOW (ref >59)
GFR, EST AFRICAN AMERICAN: 39 mL/min/{1.73_m2} — AB (ref >59)
Glucose: 139 mg/dL — ABNORMAL HIGH (ref 65–99)
Potassium: 4.9 mmol/L (ref 3.5–5.2)
Sodium: 142 mmol/L (ref 134–144)

## 2018-05-08 ENCOUNTER — Telehealth: Payer: Self-pay | Admitting: Family Medicine

## 2018-05-08 DIAGNOSIS — N183 Chronic kidney disease, stage 3 unspecified: Secondary | ICD-10-CM

## 2018-05-08 MED ORDER — METFORMIN HCL 500 MG PO TABS: 500.0000 mg | ORAL_TABLET | Freq: Two times a day (BID) | ORAL | 0 refills | Status: DC

## 2018-05-08 NOTE — Assessment & Plan Note (Signed)
A1c 6.5, excellent control. Fasting sugars running a little too low at 80-100. Will decrease lantus to 10 units daily. Check renal function today. On lisinopril for renal protection. Patient to call and get eye records sent to [place]. Prevnar given today as requested by his hematologist for monoclonal gammopathy.

## 2018-05-08 NOTE — Telephone Encounter (Signed)
Called patient regarding BMET results. Renal function is decreased from last time. Recommend decreasing metformin to 500mg  twice daily, down from 1000mg  twice daily. Will recheck BMET in 1 month. Lab visit scheduled, order entered, and new rx sent. Patient aprpeciative.  Leeanne Rio, MD

## 2018-05-12 ENCOUNTER — Telehealth: Payer: Self-pay

## 2018-05-12 NOTE — Telephone Encounter (Signed)
Adela Lank is calling for Dr. Ardelia Mems. Would like to talk about Knute Mazzuca and his Lantus medication. Adela Lank can be reached at5 [phone]. Please call. Ottis Stain, CMA

## 2018-05-18 ENCOUNTER — Other Ambulatory Visit: Payer: Self-pay | Admitting: *Deleted

## 2018-05-18 MED ORDER — INSULIN DEGLUDEC 100 UNIT/ML ~~LOC~~ SOPN: 8.0000 [IU] | PEN_INJECTOR | Freq: Every day | SUBCUTANEOUS | 3 refills | Status: DC

## 2018-05-18 MED ORDER — ROSUVASTATIN CALCIUM 40 MG PO TABS: 40.0000 mg | ORAL_TABLET | Freq: Every day | ORAL | 3 refills | Status: DC

## 2018-05-18 NOTE — Telephone Encounter (Signed)
Pts wife is calling again about his medication.  Insurance will not cover lantus but they will cover Tresiba, levemir or basiglar.  Would MD be willing to Rx any of these.  She would like an answer asap as pt will be running out "very soon". Howie Rufus, [place] Spotted, CMA

## 2018-05-18 NOTE — Addendum Note (Signed)
Addended by: Leeanne Rio on: 05/18/2018 09:24 AM   Modules accepted: Orders

## 2018-05-18 NOTE — Telephone Encounter (Signed)
I sent in tresiba for him. He should reduce his dose to 8 units per day, to make sure he tolerates the new medication. Please let patient/wife know.  Thanks! Leeanne Rio, MD

## 2018-05-18 NOTE — Telephone Encounter (Signed)
Pt spouse informed but got angry because she said he is out of his crestor as well. Informed her that the message only stated the insulin. Please advise. Deseree Kennon Holter, CMA

## 2018-05-18 NOTE — Telephone Encounter (Signed)
I got a refill request on his crestor this morning and sent that in as well this morning. Please let wife know. Leeanne Rio, MD

## 2018-05-27 ENCOUNTER — Encounter: Payer: Self-pay | Admitting: Family Medicine

## 2018-06-01 DIAGNOSIS — Z1211 Encounter for screening for malignant neoplasm of colon: Secondary | ICD-10-CM | POA: Diagnosis not present

## 2018-06-02 ENCOUNTER — Other Ambulatory Visit: Payer: Medicare HMO

## 2018-06-02 DIAGNOSIS — N183 Chronic kidney disease, stage 3 unspecified: Secondary | ICD-10-CM

## 2018-06-03 LAB — BASIC METABOLIC PANEL
BUN/Creatinine Ratio: 15 (ref 10–24)
BUN: 28 mg/dL — ABNORMAL HIGH (ref 8–27)
CHLORIDE: 107 mmol/L — AB (ref 96–106)
CO2: 21 mmol/L (ref 20–29)
CREATININE: 1.86 mg/dL — AB (ref 0.76–1.27)
Calcium: 9.2 mg/dL (ref 8.6–10.2)
GFR calc Af Amer: 44 mL/min/{1.73_m2} — ABNORMAL LOW (ref >59)
GFR calc non Af Amer: 38 mL/min/{1.73_m2} — ABNORMAL LOW (ref >59)
Glucose: 163 mg/dL — ABNORMAL HIGH (ref 65–99)
POTASSIUM: 4.3 mmol/L (ref 3.5–5.2)
SODIUM: 141 mmol/L (ref 134–144)

## 2018-06-09 ENCOUNTER — Encounter: Payer: Self-pay | Admitting: Family Medicine

## 2018-06-09 DIAGNOSIS — K5730 Diverticulosis of large intestine without perforation or abscess without bleeding: Secondary | ICD-10-CM | POA: Diagnosis not present

## 2018-06-09 DIAGNOSIS — K5140 Inflammatory polyps of colon without complications: Secondary | ICD-10-CM | POA: Diagnosis not present

## 2018-06-09 DIAGNOSIS — Z1211 Encounter for screening for malignant neoplasm of colon: Secondary | ICD-10-CM | POA: Diagnosis not present

## 2018-06-09 DIAGNOSIS — D122 Benign neoplasm of ascending colon: Secondary | ICD-10-CM | POA: Diagnosis not present

## 2018-06-09 DIAGNOSIS — K635 Polyp of colon: Secondary | ICD-10-CM | POA: Diagnosis not present

## 2018-06-16 DIAGNOSIS — R972 Elevated prostate specific antigen [PSA]: Secondary | ICD-10-CM | POA: Diagnosis not present

## 2018-06-22 ENCOUNTER — Telehealth: Payer: Self-pay | Admitting: Family Medicine

## 2018-06-22 DIAGNOSIS — R972 Elevated prostate specific antigen [PSA]: Secondary | ICD-10-CM | POA: Diagnosis not present

## 2018-06-22 DIAGNOSIS — N400 Enlarged prostate without lower urinary tract symptoms: Secondary | ICD-10-CM | POA: Diagnosis not present

## 2018-06-22 NOTE — Telephone Encounter (Signed)
Pt came in office and wanted to leave a message to the MD. Pt stated that the Richard Gill is not working. Best phone # to contact is [phone].

## 2018-06-26 NOTE — Telephone Encounter (Signed)
Attempted to reach patient. No answer. LVM asking him to call back. Leeanne Rio, MD

## 2018-06-29 ENCOUNTER — Encounter: Payer: Self-pay | Admitting: Family Medicine

## 2018-07-03 ENCOUNTER — Encounter: Payer: Self-pay | Admitting: Family Medicine

## 2018-07-06 ENCOUNTER — Ambulatory Visit (INDEPENDENT_AMBULATORY_CARE_PROVIDER_SITE_OTHER): Payer: Medicare HMO

## 2018-07-06 DIAGNOSIS — Z23 Encounter for immunization: Secondary | ICD-10-CM

## 2018-07-06 NOTE — Progress Notes (Signed)
   Patient in to nurse clinic for Pneumovax. Injection given left deltoid. Patient tolerated well. Danley Danker, RN [facility] Clinic RN)

## 2018-08-07 DIAGNOSIS — E1122 Type 2 diabetes mellitus with diabetic chronic kidney disease: Secondary | ICD-10-CM | POA: Diagnosis not present

## 2018-08-07 DIAGNOSIS — N400 Enlarged prostate without lower urinary tract symptoms: Secondary | ICD-10-CM | POA: Diagnosis not present

## 2018-08-07 DIAGNOSIS — N2581 Secondary hyperparathyroidism of renal origin: Secondary | ICD-10-CM | POA: Diagnosis not present

## 2018-08-07 DIAGNOSIS — D631 Anemia in chronic kidney disease: Secondary | ICD-10-CM | POA: Diagnosis not present

## 2018-08-07 DIAGNOSIS — E1129 Type 2 diabetes mellitus with other diabetic kidney complication: Secondary | ICD-10-CM | POA: Diagnosis not present

## 2018-08-07 DIAGNOSIS — I129 Hypertensive chronic kidney disease with stage 1 through stage 4 chronic kidney disease, or unspecified chronic kidney disease: Secondary | ICD-10-CM | POA: Diagnosis not present

## 2018-08-07 DIAGNOSIS — N183 Chronic kidney disease, stage 3 (moderate): Secondary | ICD-10-CM | POA: Diagnosis not present

## 2018-08-07 DIAGNOSIS — D472 Monoclonal gammopathy: Secondary | ICD-10-CM | POA: Diagnosis not present

## 2018-08-21 DIAGNOSIS — N183 Chronic kidney disease, stage 3 (moderate): Secondary | ICD-10-CM | POA: Diagnosis not present

## 2018-09-29 ENCOUNTER — Encounter: Payer: Self-pay | Admitting: Family Medicine

## 2018-09-29 ENCOUNTER — Ambulatory Visit (INDEPENDENT_AMBULATORY_CARE_PROVIDER_SITE_OTHER): Payer: Medicare HMO | Admitting: Family Medicine

## 2018-09-29 VITALS — BP 140/60 | HR 61 | Temp 98.3°F | Wt 161.0 lb

## 2018-09-29 DIAGNOSIS — E119 Type 2 diabetes mellitus without complications: Secondary | ICD-10-CM

## 2018-09-29 DIAGNOSIS — Z794 Long term (current) use of insulin: Secondary | ICD-10-CM | POA: Diagnosis not present

## 2018-09-29 LAB — POCT GLYCOSYLATED HEMOGLOBIN (HGB A1C): HBA1C, POC (CONTROLLED DIABETIC RANGE): 7.3 % — AB (ref 0.0–7.0)

## 2018-09-29 MED ORDER — INSULIN PEN NEEDLE 31G X 6 MM MISC: 5 refills | Status: DC

## 2018-09-29 MED ORDER — GLUCOSE BLOOD VI STRP: ORAL_STRIP | 12 refills | Status: DC

## 2018-09-29 MED ORDER — BASAGLAR KWIKPEN 100 UNIT/ML ~~LOC~~ SOPN: 10.0000 [IU] | PEN_INJECTOR | Freq: Every day | SUBCUTANEOUS | 3 refills | Status: DC

## 2018-09-29 MED ORDER — ACCU-CHEK SOFT TOUCH LANCETS MISC: 12 refills | Status: DC

## 2018-09-29 NOTE — Patient Instructions (Signed)
It was wonderful to see you today.  Thank you for choosing [facility].   Please call [phone] with any questions about today's appointment.  Please be sure to schedule follow up at the front  desk before you leave today.   [name], MD  Family Medicine   Please call us if your insulin is too expensive or is not working well  Make an appointment with Dr. Ardelia Mems in 3 months

## 2018-09-29 NOTE — Progress Notes (Signed)
Patient Name: Richard Gill. Date of Birth: 05/30/1956 Date of Visit: 09/29/18 PCP: Leeanne Rio, MD  Chief Complaint: diabetes   Subjective: Richard Gill. is a pleasant 62 y.o. year old gentleman with history of type II diabetes on insulin therapy, MGUS, CKD III, and HTN presenting today for routine follow up. He reports overall he feels well.   The patient reports overall he is doing well.  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  He is very busy.  He reports he is glad it is raining today as he has time for his own health care.  He reports significant difficulty with [place].  He reports he had been on Lantus for a long time taking between 8 and 10 units daily in the morning.  His A1c's were pristinely controlled at this level he reports.  His insurance covers [place]  above preferentially this year.  He has been on this for the past 3 months.  He reports his morning blood glucoses have been higher than they ever have been in the past.  He reports morning  blood glucoses are between 180-200. He is displeased his A1C is elevated today. Denies polyuria, polydipsia, or hypoglycemia symptoms.  The patient reports he regularly sees an Materials engineer in [place]. He follows with a Nephrologist for his CKD.   ROS:  ROS Negative as above.   I have reviewed the patient's medical, surgical, family, and social history as appropriate.   Vitals:   09/29/18 0902  BP: 140/60  Pulse: 61  Temp: 98.3 F (36.8 C)  SpO2: 98%   Filed Weights   09/29/18 0902  Weight: 161 lb (73 kg)  HEENT: Sclera anicteric. Dentition is moderate. Appears well hydrated. Neck: Supple Cardiac: Regular rate and rhythm. Normal S1/S2. No murmurs, rubs, or gallops appreciated. Lungs: Clear bilaterally to ascultation.  Abdomen: Normoactive bowel sounds. No tenderness to deep or light palpation. No rebound or guarding.  Extremities: Warm, well perfused without edema.  Skin: Warm, dry Psych: Pleasant and  appropriate  Diagnoses and all orders for this visit:  Type 2 diabetes mellitus without complication, unspecified whether long term insulin use (HCC) worsening control, on long-term insulin use requiring further management.  His A1c is elevated today from prior he is displeased with current therapy we will transition to an alternative and titrate and monitor carefully. -     HgB A1c -     Insulin Glargine (BASAGLAR KWIKPEN) 100 UNIT/ML SOPN; Inject 0.1 mLs (10 Units total) into the skin daily. -     Insulin Pen Needle (RELION PEN NEEDLES) 31G X 6 MM MISC; Use to inject insulin once daily -     glucose blood (RELION GLUCOSE TEST STRIPS) test strip; Check sugar 3 times daily -     Lancets (ACCU-CHEK SOFT TOUCH) lancets; Use as instructed -     glucose blood (ACCU-CHEK ACTIVE STRIPS) test strip; Use as instructed to test once daily       - The patient is on metformin at the appropriate dose for his glomerular filtration.  He follows with nephrology for this.  Repeat BMP at follow-up.      -  Transition to insulin glargine as above after discussion with patient.  Will see pharmacy coverage of this.  Is listed as a preferred medication on the current formulary.      -  The patient is on an ACE inhibitor and a statin.  The patient is up-to-date on his Pneumovax.  He declined  a flu shot today.  HCM Declined influenza vaccine today.  We discussed at length The patient reports he had a colonoscopy last month.  He is due in 10 years.  This was normal Follows with urology for his prostate health  [name], MD  [facility] Medicine Teaching Service

## 2018-10-05 DIAGNOSIS — N183 Chronic kidney disease, stage 3 (moderate): Secondary | ICD-10-CM | POA: Diagnosis not present

## 2018-10-22 DIAGNOSIS — R6889 Other general symptoms and signs: Secondary | ICD-10-CM | POA: Diagnosis not present

## 2018-10-22 DIAGNOSIS — D472 Monoclonal gammopathy: Secondary | ICD-10-CM | POA: Diagnosis not present

## 2018-10-22 DIAGNOSIS — C9000 Multiple myeloma not having achieved remission: Secondary | ICD-10-CM | POA: Diagnosis not present

## 2018-10-26 ENCOUNTER — Other Ambulatory Visit (HOSPITAL_COMMUNITY): Payer: Self-pay | Admitting: Internal Medicine

## 2018-10-26 DIAGNOSIS — C9000 Multiple myeloma not having achieved remission: Secondary | ICD-10-CM

## 2018-10-27 ENCOUNTER — Other Ambulatory Visit: Payer: Self-pay | Admitting: Family Medicine

## 2018-10-27 DIAGNOSIS — E113299 Type 2 diabetes mellitus with mild nonproliferative diabetic retinopathy without macular edema, unspecified eye: Secondary | ICD-10-CM | POA: Diagnosis not present

## 2018-10-28 DIAGNOSIS — R310 Gross hematuria: Secondary | ICD-10-CM | POA: Diagnosis not present

## 2018-10-28 DIAGNOSIS — R972 Elevated prostate specific antigen [PSA]: Secondary | ICD-10-CM | POA: Diagnosis not present

## 2018-11-04 DIAGNOSIS — R310 Gross hematuria: Secondary | ICD-10-CM | POA: Diagnosis not present

## 2018-11-04 DIAGNOSIS — N281 Cyst of kidney, acquired: Secondary | ICD-10-CM | POA: Diagnosis not present

## 2018-11-12 DIAGNOSIS — N281 Cyst of kidney, acquired: Secondary | ICD-10-CM | POA: Diagnosis not present

## 2018-11-12 DIAGNOSIS — R310 Gross hematuria: Secondary | ICD-10-CM | POA: Diagnosis not present

## 2018-11-12 DIAGNOSIS — N400 Enlarged prostate without lower urinary tract symptoms: Secondary | ICD-10-CM | POA: Diagnosis not present

## 2018-11-12 DIAGNOSIS — R972 Elevated prostate specific antigen [PSA]: Secondary | ICD-10-CM | POA: Diagnosis not present

## 2018-11-17 ENCOUNTER — Ambulatory Visit (HOSPITAL_COMMUNITY): Payer: Medicare HMO

## 2018-11-18 DIAGNOSIS — N281 Cyst of kidney, acquired: Secondary | ICD-10-CM | POA: Diagnosis not present

## 2018-11-19 ENCOUNTER — Ambulatory Visit (HOSPITAL_COMMUNITY)
Admission: RE | Admit: 2018-11-19 | Discharge: 2018-11-19 | Disposition: A | Discharge status: Home / Self Care | Payer: Medicare HMO | Source: Ambulatory Visit | Attending: Internal Medicine | Admitting: Internal Medicine

## 2018-11-19 DIAGNOSIS — C9000 Multiple myeloma not having achieved remission: Secondary | ICD-10-CM | POA: Diagnosis not present

## 2018-11-19 DIAGNOSIS — R911 Solitary pulmonary nodule: Secondary | ICD-10-CM | POA: Diagnosis not present

## 2018-12-07 ENCOUNTER — Encounter: Payer: Self-pay | Admitting: Family Medicine

## 2018-12-07 ENCOUNTER — Ambulatory Visit (INDEPENDENT_AMBULATORY_CARE_PROVIDER_SITE_OTHER): Payer: Medicare HMO | Admitting: Family Medicine

## 2018-12-07 VITALS — BP 122/80 | HR 80 | Temp 98.1°F | Wt 158.8 lb

## 2018-12-07 DIAGNOSIS — H8111 Benign paroxysmal vertigo, right ear: Secondary | ICD-10-CM | POA: Insufficient documentation

## 2018-12-07 MED ORDER — MECLIZINE HCL 12.5 MG PO TABS: 12.5000 mg | ORAL_TABLET | Freq: Three times a day (TID) | ORAL | 0 refills | Status: DC | PRN

## 2018-12-07 NOTE — Assessment & Plan Note (Signed)
Subacute.  Seems to be positional consistent with BPPV.  Patient did report some sore throat during time of onset which may support labyrinthitis, however patient does not have any hearing changes or fullness sensation.  There is no signs of rash in his ear or mass.  Neuro exam unremarkable. - Patient given course of meclizine 12.5 mg twice daily as needed for flares - Ambulatory referral to vestibular PT - Reviewed return precautions, RTC 1 month if not improved

## 2018-12-07 NOTE — Patient Instructions (Signed)
Thank you for coming in to see [place] today. Please see below to review our plan for today's visit.  Your symptoms seem to be consistent with a disorder called benign paroxysmal positional vertigo.  This is typically due to a stone in your inner ear given you the sensation of feeling off balance.  Take the meclizine I prescribed you twice daily as needed for flares.  You should receive a phone call to schedule for your vestibular rehab.  Please return to the clinic in 1 month if your symptoms do not improve.  Please call the clinic at [phone] if your symptoms worsen or you have any concerns. It was our pleasure to serve you.  [name], DO [place], PGY-3    Benign Positional Vertigo Vertigo is the feeling that you or your surroundings are moving when they are not. Benign positional vertigo is the most common form of vertigo. This is usually a harmless condition (benign). This condition is positional. This means that symptoms are triggered by certain movements and positions. This condition can be dangerous if it occurs while you are doing something that could cause harm to you or others. This includes activities such as driving or operating machinery. What are the causes? In many cases, the cause of this condition is not known. It may be caused by a disturbance in an area of the inner ear that helps your brain to sense movement and balance. This disturbance can be caused by:  Viral infection (labyrinthitis).  Head injury.  Repetitive motion, such as jumping, dancing, or running. What increases the risk? You are more likely to develop this condition if:  You are a woman.  You are 28 years of age or older. What are the signs or symptoms? Symptoms of this condition usually happen when you move your head or your eyes in different directions. Symptoms may start suddenly, and usually last for less than a minute. They include:  Loss of balance and falling.  Feeling  like you are spinning or moving.  Feeling like your surroundings are spinning or moving.  Nausea and vomiting.  Blurred vision.  Dizziness.  Involuntary eye movement (nystagmus). Symptoms can be mild and cause only minor problems, or they can be severe and interfere with daily life. Episodes of benign positional vertigo may return (recur) over time. Symptoms may improve over time. How is this diagnosed? This condition may be diagnosed based on:  Your medical history.  Physical exam of the head, neck, and ears.  Tests, such as: ? MRI. ? CT scan. ? Eye movement tests. Your health care provider may ask you to change positions quickly while he or she watches you for symptoms of benign positional vertigo, such as nystagmus. Eye movement may be tested with a variety of exams that are designed to evaluate or stimulate vertigo. ? An electroencephalogram (EEG). This records electrical activity in your brain. ? Hearing tests. You may be referred to a health care provider who specializes in ear, nose, and throat (ENT) problems (otolaryngologist) or a provider who specializes in disorders of the nervous system (neurologist). How is this treated?  This condition may be treated in a session in which your health care provider moves your head in specific positions to adjust your inner ear back to normal. Treatment for this condition may take several sessions. Surgery may be needed in severe cases, but this is rare. In some cases, benign positional vertigo may resolve on its own in 2-4 weeks. Follow these  instructions at home: Safety  Move slowly. Avoid sudden body or head movements or certain positions, as told by your health care provider.  Avoid driving until your health care provider says it is safe for you to do so.  Avoid operating heavy machinery until your health care provider says it is safe for you to do so.  Avoid doing any tasks that would be dangerous to you or others if vertigo  occurs.  If you have trouble walking or keeping your balance, try using a cane for stability. If you feel dizzy or unstable, sit down right away.  Return to your normal activities as told by your health care provider. Ask your health care provider what activities are safe for you. General instructions  Take over-the-counter and prescription medicines only as told by your health care provider.  Drink enough fluid to keep your urine pale yellow.  Keep all follow-up visits as told by your health care provider. This is important. Contact a health care provider if:  You have a fever.  Your condition gets worse or you develop new symptoms.  Your family or friends notice any behavioral changes.  You have nausea or vomiting that gets worse.  You have numbness or a "pins and needles" sensation. Get help right away if you:  Have difficulty speaking or moving.  Are always dizzy.  Faint.  Develop severe headaches.  Have weakness in your legs or arms.  Have changes in your hearing or vision.  Develop a stiff neck.  Develop sensitivity to light. Summary  Vertigo is the feeling that you or your surroundings are moving when they are not. Benign positional vertigo is the most common form of vertigo.  The cause of this condition is not known. It may be caused by a disturbance in an area of the inner ear that helps your brain to sense movement and balance.  Symptoms include loss of balance and falling, feeling that you or your surroundings are moving, nausea and vomiting, and blurred vision.  This condition can be diagnosed based on symptoms, physical exam, and other tests, such as MRI, CT scan, eye movement tests, and hearing tests.  Follow safety instructions as told by your health care provider. You will also be told when to contact your health care provider in case of problems. This information is not intended to replace advice given to you by your health care provider. Make sure  you discuss any questions you have with your health care provider. Document Released: 09/02/2006 Document Revised: 05/06/2018 Document Reviewed: 05/06/2018 Elsevier Interactive Patient Education  2019 [employer].

## 2018-12-07 NOTE — Progress Notes (Signed)
   Subjective   Patient ID: Richard Gill.    DOB: 1956/03/26, 62 y.o. male   MRN: [id number]  CC: "Feeling dizzy"  HPI: Richard Gill. is a 62 y.o. male who presents to clinic today for the following:  DIZZINESS  Feeling dizzy for 6 weeks. Dizziness is intermitent Feels like room spins: yes Lightheadedness when stands: no Palpitations or heart racing: no Prior dizziness: no Medications tried: some dramamine with some relief Taking blood thinners: no  Symptoms Hearing Loss: no Ear Pain or fullness: yes to pain but not fullness Nausea or vomiting: no Vision difficulty or double vision: no Falls: no Head trauma: no Weakness in arm or leg: no Speaking problems: no Headache: initially a pounding headache at temples during onset but resolved after 24 hours  ROS: see HPI for pertinent.  [place]: Reviewed. Smoking status reviewed. Medications reviewed.  Objective   BP 122/80   Pulse 80   Temp 98.1 F (36.7 C)   Wt 158 lb 12.8 oz (72 kg)   SpO2 99%   BMI 23.45 kg/m  Vitals and nursing note reviewed.  General: well nourished, well developed, NAD with non-toxic appearance HEENT: normocephalic, atraumatic, moist mucous membranes, PERRLA, EOMI, patent right ear with clear TM, left ear with some cerumen Neck: supple, non-tender without lymphadenopathy Cardiovascular: regular rate and rhythm without murmurs, rubs, or gallops Lungs: clear to auscultation bilaterally with normal work of breathing Skin: warm, dry, no rashes or lesions, cap refill < 2 seconds Extremities: warm and well perfused, normal tone, no edema Neuro: CNII-XII intact, no dysarthria or facial droop, negative Romberg sign, absent nystagmus with Dix-Hallpike maneuver  Assessment & Plan   BPPV (benign paroxysmal positional vertigo), right Subacute.  Seems to be positional consistent with BPPV.  Patient did report some sore throat during time of onset which may support labyrinthitis, however patient does not  have any hearing changes or fullness sensation.  There is no signs of rash in his ear or mass.  Neuro exam unremarkable. - Patient given course of meclizine 12.5 mg twice daily as needed for flares - Ambulatory referral to vestibular PT - Reviewed return precautions, RTC 1 month if not improved  Orders Placed This Encounter  Procedures  . PT vestibular rehab    Standing Status:   Future    Standing Expiration Date:   12/08/2019   Meds ordered this encounter  Medications  . meclizine (ANTIVERT) 12.5 MG tablet    Sig: Take 1 tablet (12.5 mg total) by mouth 3 (three) times daily as needed for dizziness.    Dispense:  30 tablet    Refill:  0    [name], DO [place], PGY-3 12/07/2018, 9:07 AM

## 2018-12-17 ENCOUNTER — Encounter: Payer: Self-pay | Admitting: Family Medicine

## 2019-01-01 DIAGNOSIS — R69 Illness, unspecified: Secondary | ICD-10-CM | POA: Diagnosis not present

## 2019-01-05 ENCOUNTER — Ambulatory Visit (INDEPENDENT_AMBULATORY_CARE_PROVIDER_SITE_OTHER): Payer: Medicare HMO | Admitting: Family Medicine

## 2019-01-05 VITALS — BP 140/60 | HR 59 | Temp 98.4°F | Wt 164.8 lb

## 2019-01-05 DIAGNOSIS — E119 Type 2 diabetes mellitus without complications: Secondary | ICD-10-CM

## 2019-01-05 DIAGNOSIS — Z794 Long term (current) use of insulin: Secondary | ICD-10-CM

## 2019-01-05 LAB — POCT GLYCOSYLATED HEMOGLOBIN (HGB A1C): HbA1c, POC (controlled diabetic range): 6.9 % (ref 0.0–7.0)

## 2019-01-05 MED ORDER — BASAGLAR KWIKPEN 100 UNIT/ML ~~LOC~~ SOPN: 20.0000 [IU] | PEN_INJECTOR | Freq: Every day | SUBCUTANEOUS | 3 refills | Status: DC

## 2019-01-05 MED ORDER — GLUCOSE BLOOD VI STRP: ORAL_STRIP | 12 refills | Status: DC

## 2019-01-05 NOTE — Patient Instructions (Signed)
Every morning check your blood sugar If it is over 120, add one unit to your basaglar dose for the day Tomorrow morning start with 20 units The next day, if over 120, give 21 units. If under 120, stay at 20 units. Do this daily.  Follow up in 3 months, sooner if needed  Be well, Dr. Ardelia Mems

## 2019-01-05 NOTE — Progress Notes (Signed)
Date of Visit: 01/05/2019   HPI:  Patient presents for routine follow up of diabetes   Diabetes - currently taking lantus 10-20 units per day. He adjusts the amount he takes based on what his blood sugar is. Max he takes is 20 units/day. No low sugars. If sugar is high he'll take more lantus. He is concerned that his sugars have been running high lately. Did bring meter with him.  ROS: See HPI.  [place]: history of type 2 diabetes, BPH, hyperlipidemia, monoclonal gammopathy, CKD2, hypertension   PHYSICAL EXAM: BP 140/60   Pulse (!) 59   Temp 98.4 F (36.9 C)   Wt 164 lb 12.8 oz (74.8 kg)   SpO2 100%   BMI 24.34 kg/m  Gen: no acute distress, pleasant, cooperative, well appearing HEENT: normocephalic, atraumatic, moist mucous membranes  Heart: regular rate and rhythm, no murmur Lungs: clear to auscultation bilaterally, normal work of breathing  Neuro: alert, grossly nonfocal, speech normal Ext: No appreciable lower extremity edema bilaterally  Diabetic foot exam: 2+ DP pulses bilat, normal monofilament testing bilaterally. No lesions or significant calluses.    ASSESSMENT/PLAN:  Health maintenance:  -will request colonoscopy report (have path but not actual colonoscopy report) -will request eye records (my eye doc on [address])  Diabetes A1c 6.9, denotes good control. Patient taking lantus dosing inconsistently. Reviewed instructions for how to titrate up on his lantus based on fasting sugars. Follow up in 3 months for next A1c  FOLLOW UP: Follow up in 3 mos for A1c  [name], [place]

## 2019-01-06 LAB — LIPID PANEL
Chol/HDL Ratio: 4.8 ratio (ref 0.0–5.0)
Cholesterol, Total: 176 mg/dL (ref 100–199)
HDL: 37 mg/dL — AB (ref >39)
LDL Calculated: 123 mg/dL — ABNORMAL HIGH (ref 0–99)
Triglycerides: 81 mg/dL (ref 0–149)
VLDL Cholesterol Cal: 16 mg/dL (ref 5–40)

## 2019-01-06 LAB — CMP14+EGFR
ALT: 11 IU/L (ref 0–44)
AST: 16 IU/L (ref 0–40)
Albumin/Globulin Ratio: 1.4 (ref 1.2–2.2)
Albumin: 4.4 g/dL (ref 3.8–4.8)
Alkaline Phosphatase: 123 IU/L — ABNORMAL HIGH (ref 39–117)
BUN/Creatinine Ratio: 14 (ref 10–24)
BUN: 26 mg/dL (ref 8–27)
Bilirubin Total: 0.3 mg/dL (ref 0.0–1.2)
CO2: 21 mmol/L (ref 20–29)
Calcium: 9.1 mg/dL (ref 8.6–10.2)
Chloride: 108 mmol/L — ABNORMAL HIGH (ref 96–106)
Creatinine, Ser: 1.85 mg/dL — ABNORMAL HIGH (ref 0.76–1.27)
GFR calc Af Amer: 44 mL/min/{1.73_m2} — ABNORMAL LOW (ref >59)
GFR calc non Af Amer: 38 mL/min/{1.73_m2} — ABNORMAL LOW (ref >59)
GLUCOSE: 146 mg/dL — AB (ref 65–99)
Globulin, Total: 3.1 g/dL (ref 1.5–4.5)
POTASSIUM: 5.3 mmol/L — AB (ref 3.5–5.2)
Sodium: 143 mmol/L (ref 134–144)
Total Protein: 7.5 g/dL (ref 6.0–8.5)

## 2019-01-08 ENCOUNTER — Telehealth: Payer: Self-pay | Admitting: Family Medicine

## 2019-01-08 DIAGNOSIS — E875 Hyperkalemia: Secondary | ICD-10-CM

## 2019-01-08 NOTE — Telephone Encounter (Signed)
Called patient to discuss labs. No answer. LVM asking him to call back. Potassium was a little bit high, as was alk phos. I want to repeat these next week.  If patient calls back please have him schedule a lab visit to recheck these tests. I am happy to speak with him if he has questions.  Thanks Leeanne Rio, MD

## 2019-01-15 NOTE — Telephone Encounter (Signed)
Red team can you call patient? Thanks Leeanne Rio, MD

## 2019-01-17 ENCOUNTER — Other Ambulatory Visit: Payer: Self-pay | Admitting: Family Medicine

## 2019-01-19 ENCOUNTER — Other Ambulatory Visit: Payer: Self-pay | Admitting: Family Medicine

## 2019-01-20 ENCOUNTER — Telehealth: Payer: Self-pay

## 2019-01-20 NOTE — Telephone Encounter (Signed)
Patient has lab appointment on 01/22/2019 at 0830.  Marland KitchenOzella Gill, CMA

## 2019-01-20 NOTE — Telephone Encounter (Signed)
Red team, following up. Can you call patient? Thanks Leeanne Rio, MD

## 2019-01-21 DIAGNOSIS — H524 Presbyopia: Secondary | ICD-10-CM | POA: Diagnosis not present

## 2019-01-22 ENCOUNTER — Other Ambulatory Visit: Payer: Medicare HMO

## 2019-01-22 DIAGNOSIS — E875 Hyperkalemia: Secondary | ICD-10-CM | POA: Diagnosis not present

## 2019-01-23 LAB — CMP14+EGFR
ALT: 13 IU/L (ref 0–44)
AST: 13 IU/L (ref 0–40)
Albumin/Globulin Ratio: 1.5 (ref 1.2–2.2)
Albumin: 4.5 g/dL (ref 3.8–4.8)
Alkaline Phosphatase: 118 IU/L — ABNORMAL HIGH (ref 39–117)
BUN/Creatinine Ratio: 19 (ref 10–24)
BUN: 34 mg/dL — ABNORMAL HIGH (ref 8–27)
Bilirubin Total: 0.4 mg/dL (ref 0.0–1.2)
CO2: 20 mmol/L (ref 20–29)
Calcium: 9 mg/dL (ref 8.6–10.2)
Chloride: 107 mmol/L — ABNORMAL HIGH (ref 96–106)
Creatinine, Ser: 1.81 mg/dL — ABNORMAL HIGH (ref 0.76–1.27)
GFR calc Af Amer: 45 mL/min/{1.73_m2} — ABNORMAL LOW (ref >59)
GFR calc non Af Amer: 39 mL/min/{1.73_m2} — ABNORMAL LOW (ref >59)
Globulin, Total: 3 g/dL (ref 1.5–4.5)
Glucose: 158 mg/dL — ABNORMAL HIGH (ref 65–99)
Potassium: 5 mmol/L (ref 3.5–5.2)
Sodium: 142 mmol/L (ref 134–144)
Total Protein: 7.5 g/dL (ref 6.0–8.5)

## 2019-02-05 ENCOUNTER — Encounter: Payer: Self-pay | Admitting: Family Medicine

## 2019-02-26 ENCOUNTER — Other Ambulatory Visit: Payer: Self-pay | Admitting: Family Medicine

## 2019-03-10 DIAGNOSIS — D631 Anemia in chronic kidney disease: Secondary | ICD-10-CM | POA: Diagnosis not present

## 2019-03-10 DIAGNOSIS — E1122 Type 2 diabetes mellitus with diabetic chronic kidney disease: Secondary | ICD-10-CM | POA: Diagnosis not present

## 2019-03-10 DIAGNOSIS — D472 Monoclonal gammopathy: Secondary | ICD-10-CM | POA: Diagnosis not present

## 2019-03-10 DIAGNOSIS — N2581 Secondary hyperparathyroidism of renal origin: Secondary | ICD-10-CM | POA: Diagnosis not present

## 2019-03-10 DIAGNOSIS — N400 Enlarged prostate without lower urinary tract symptoms: Secondary | ICD-10-CM | POA: Diagnosis not present

## 2019-03-10 DIAGNOSIS — I129 Hypertensive chronic kidney disease with stage 1 through stage 4 chronic kidney disease, or unspecified chronic kidney disease: Secondary | ICD-10-CM | POA: Diagnosis not present

## 2019-03-10 DIAGNOSIS — N183 Chronic kidney disease, stage 3 (moderate): Secondary | ICD-10-CM | POA: Diagnosis not present

## 2019-04-22 ENCOUNTER — Other Ambulatory Visit: Payer: Self-pay

## 2019-04-22 ENCOUNTER — Telehealth (INDEPENDENT_AMBULATORY_CARE_PROVIDER_SITE_OTHER): Payer: Medicare HMO | Admitting: Family Medicine

## 2019-04-22 DIAGNOSIS — Z794 Long term (current) use of insulin: Secondary | ICD-10-CM

## 2019-04-22 DIAGNOSIS — E119 Type 2 diabetes mellitus without complications: Secondary | ICD-10-CM | POA: Diagnosis not present

## 2019-04-22 NOTE — Assessment & Plan Note (Signed)
Not able to uptitrate insulin as fasting sugars are occasionally in 80s, though sugars later in the day seem higher. A1c would be helpful to delineate overall control, but patient prefers not to come into the office right now due to [place] pandemic. He will keep a more detailed log of what his sugars are in relation to meals, and schedule another virtual visit in 1 month to reassess.

## 2019-04-22 NOTE — Progress Notes (Signed)
[facility] Telemedicine Visit  Patient consented to have virtual visit. Method of visit: Telephone  Encounter participants: Patient: Richard Gill. - located at home Provider: Chrisandra Netters - located at office Others (if applicable): n/a  Chief Complaint: regular follow up  HPI:  Diabetes - currently taking basaglar 25 units daily. Fasting sugars are running 80s-170s. Thinks the morning sugars that are higher are because he's eaten later at night the night before. No low sugars. No chest pain or shortness of breath. Meter averages daily sugar of 217 but he is checking the sugar later in the day as well.   Hydroxyzine refills - inquired as to why he's on hydroxyzine. Reports he takes it for itching on his legs. Has been on it for years, uses as needed but lately has used daily. Does not get rash. Itching is well controlled with this medication. Not worsening.  ROS: per HPI  Pertinent PMHx: history of type 2 diabetes, BPH, hyperlipidemia, hypertension, monoclonal gammopathy, ckd2  Exam:  BP checked 115/64 reported by patient.   Respiratory: Patient speaking normally in full sentences throughout the phone encounter, without any respiratory distress evident.   Assessment/Plan:  T2DM (type 2 diabetes mellitus) ([place]) Not able to uptitrate insulin as fasting sugars are occasionally in 80s, though sugars later in the day seem higher. A1c would be helpful to delineate overall control, but patient prefers not to come into the office right now due to [place] pandemic. He will keep a more detailed log of what his sugars are in relation to meals, and schedule another virtual visit in 1 month to reassess.   Will request eye records from [facility] - patient reports had eyes checked not too long ago  Leg itching Chronic, stable, well controlled by hydroxyzine  Time spent during visit with patient: 11 minutes

## 2019-04-26 DIAGNOSIS — C9000 Multiple myeloma not having achieved remission: Secondary | ICD-10-CM | POA: Diagnosis not present

## 2019-04-29 ENCOUNTER — Other Ambulatory Visit: Payer: Self-pay | Admitting: Family Medicine

## 2019-05-10 DIAGNOSIS — C9000 Multiple myeloma not having achieved remission: Secondary | ICD-10-CM | POA: Diagnosis not present

## 2019-05-20 DIAGNOSIS — R972 Elevated prostate specific antigen [PSA]: Secondary | ICD-10-CM | POA: Diagnosis not present

## 2019-05-25 DIAGNOSIS — N400 Enlarged prostate without lower urinary tract symptoms: Secondary | ICD-10-CM | POA: Diagnosis not present

## 2019-05-25 DIAGNOSIS — R972 Elevated prostate specific antigen [PSA]: Secondary | ICD-10-CM | POA: Diagnosis not present

## 2019-07-05 ENCOUNTER — Other Ambulatory Visit: Payer: Self-pay

## 2019-07-05 ENCOUNTER — Other Ambulatory Visit (INDEPENDENT_AMBULATORY_CARE_PROVIDER_SITE_OTHER): Payer: Medicare HMO

## 2019-07-05 DIAGNOSIS — Z794 Long term (current) use of insulin: Secondary | ICD-10-CM

## 2019-07-05 DIAGNOSIS — E119 Type 2 diabetes mellitus without complications: Secondary | ICD-10-CM | POA: Diagnosis not present

## 2019-07-05 LAB — POCT GLYCOSYLATED HEMOGLOBIN (HGB A1C): HbA1c, POC (controlled diabetic range): 6.8 % (ref 0.0–7.0)

## 2019-07-14 ENCOUNTER — Telehealth: Payer: Self-pay

## 2019-07-14 NOTE — Telephone Encounter (Signed)
Informed patient of results. States he Will schedule later .  Marland KitchenOzella Almond, CMA

## 2019-08-30 ENCOUNTER — Other Ambulatory Visit: Payer: Self-pay | Admitting: Family Medicine

## 2019-08-30 NOTE — Telephone Encounter (Signed)
Please remind patient to schedule a follow up visit (in person or virtual) for diabetes Thanks Leeanne Rio, MD

## 2019-09-10 ENCOUNTER — Other Ambulatory Visit: Payer: Self-pay

## 2019-09-10 ENCOUNTER — Encounter: Payer: Self-pay | Admitting: Family Medicine

## 2019-09-10 ENCOUNTER — Telehealth (INDEPENDENT_AMBULATORY_CARE_PROVIDER_SITE_OTHER): Payer: Medicare HMO | Admitting: Family Medicine

## 2019-09-10 VITALS — BP 157/84 | Ht 69.0 in | Wt 162.0 lb

## 2019-09-10 DIAGNOSIS — E119 Type 2 diabetes mellitus without complications: Secondary | ICD-10-CM

## 2019-09-10 DIAGNOSIS — Z794 Long term (current) use of insulin: Secondary | ICD-10-CM | POA: Diagnosis not present

## 2019-09-10 NOTE — Progress Notes (Signed)
[facility] Telemedicine Visit  Patient consented to have virtual visit. Method of visit: Telephone  Encounter participants: Patient: Richard Gill. - located at home Provider: Chrisandra Netters - located at office Others (if applicable): n/a  Chief Complaint: follow up   HPI:  Diabetes - currently taking metformin 500mg  twice daily and basaglar 25 units once per day. Checks sugars on occasion. Lowest sugar he's had was in the 70s. No chest pain or shortness of breath.   Hydroxyzine - takes for itching as needed if he itches. Takes it essentially every day. He does not know the cause of the itching. Tolerating this medication well.  ROS: per HPI  Pertinent PMHx: history of BPH, CKD, hypertension, hyperlipidemia, monoconal gammopathy, type 2 diabetes   Exam:  Respiratory: Patient speaking normally in full sentences throughout the encounter, without any respiratory distress evident.   Assessment/Plan:  T2DM (type 2 diabetes mellitus) ([place]) At goal on recent A1c. No hypoglycemia. Continue current regimen. Follow up in office in a few months for next A1c.   Itching - rx'd hydroxyzine 6 years ago when he had hives, has continually taken this as needed since then. Ok to continue as patient perceives benefit.  Health maintenance -Will request records from [facility] [place] need colonoscopy report from Dr. Benson Norway, will request  Time spent during visit with patient: 9 minutes

## 2019-09-10 NOTE — Assessment & Plan Note (Signed)
At goal on recent A1c. No hypoglycemia. Continue current regimen. Follow up in office in a few months for next A1c.

## 2019-09-20 ENCOUNTER — Other Ambulatory Visit: Payer: Self-pay | Admitting: Family Medicine

## 2019-10-29 DIAGNOSIS — N2581 Secondary hyperparathyroidism of renal origin: Secondary | ICD-10-CM | POA: Diagnosis not present

## 2019-10-29 DIAGNOSIS — N183 Chronic kidney disease, stage 3 unspecified: Secondary | ICD-10-CM | POA: Diagnosis not present

## 2019-10-29 DIAGNOSIS — I129 Hypertensive chronic kidney disease with stage 1 through stage 4 chronic kidney disease, or unspecified chronic kidney disease: Secondary | ICD-10-CM | POA: Diagnosis not present

## 2019-10-29 DIAGNOSIS — E1122 Type 2 diabetes mellitus with diabetic chronic kidney disease: Secondary | ICD-10-CM | POA: Diagnosis not present

## 2019-10-29 DIAGNOSIS — N400 Enlarged prostate without lower urinary tract symptoms: Secondary | ICD-10-CM | POA: Diagnosis not present

## 2019-10-29 DIAGNOSIS — N189 Chronic kidney disease, unspecified: Secondary | ICD-10-CM | POA: Diagnosis not present

## 2019-10-29 DIAGNOSIS — D472 Monoclonal gammopathy: Secondary | ICD-10-CM | POA: Diagnosis not present

## 2019-11-01 DIAGNOSIS — N189 Chronic kidney disease, unspecified: Secondary | ICD-10-CM | POA: Diagnosis not present

## 2019-11-01 DIAGNOSIS — R911 Solitary pulmonary nodule: Secondary | ICD-10-CM | POA: Diagnosis not present

## 2019-11-01 DIAGNOSIS — C9000 Multiple myeloma not having achieved remission: Secondary | ICD-10-CM | POA: Diagnosis not present

## 2019-11-01 DIAGNOSIS — R6889 Other general symptoms and signs: Secondary | ICD-10-CM | POA: Diagnosis not present

## 2019-11-01 DIAGNOSIS — D472 Monoclonal gammopathy: Secondary | ICD-10-CM | POA: Diagnosis not present

## 2019-11-16 ENCOUNTER — Other Ambulatory Visit: Payer: Self-pay

## 2019-11-16 ENCOUNTER — Ambulatory Visit (INDEPENDENT_AMBULATORY_CARE_PROVIDER_SITE_OTHER): Payer: Medicare HMO | Admitting: Family Medicine

## 2019-11-16 ENCOUNTER — Encounter: Payer: Self-pay | Admitting: Family Medicine

## 2019-11-16 VITALS — BP 150/80 | HR 69 | Wt 167.2 lb

## 2019-11-16 DIAGNOSIS — Z23 Encounter for immunization: Secondary | ICD-10-CM | POA: Diagnosis not present

## 2019-11-16 DIAGNOSIS — E119 Type 2 diabetes mellitus without complications: Secondary | ICD-10-CM

## 2019-11-16 DIAGNOSIS — H15101 Unspecified episcleritis, right eye: Secondary | ICD-10-CM | POA: Diagnosis not present

## 2019-11-16 DIAGNOSIS — Z794 Long term (current) use of insulin: Secondary | ICD-10-CM | POA: Diagnosis not present

## 2019-11-16 LAB — POCT GLYCOSYLATED HEMOGLOBIN (HGB A1C): HbA1c, POC (controlled diabetic range): 6.7 % (ref 0.0–7.0)

## 2019-11-16 NOTE — Patient Instructions (Addendum)
Go down to 23 units on your insulin Call if sugars still go down  Please go directly to [facility] for your eye. They are expecting you.  [place] [phone] Phone Number  Come back for a nurse blood pressure check in a week or so. If blood pressure still elevated at that time we will likely need to start a blood pressure medication.  Be well, Dr. Ardelia Mems

## 2019-11-16 NOTE — Assessment & Plan Note (Signed)
Well controlled by A1c's, with some lows. Decrease basaglar to 23 units daily. Patient to call if hypoglycemia continues, as we may decrease insulin further.

## 2019-11-16 NOTE — Progress Notes (Signed)
Date of Visit: 11/16/2019   HPI:  Richard Gill presents for follow up, also to discuss acute concern.  Red painful eye - R eye has been painful and red for the past 2 weeks. Just began suddenly one day. No drainage from the eye. No fevers or congestion. Had something similar in the past and he was told it was likely allergies. No history of glaucoma. Eye hurts more when he tries to focus his vision but denies any vision changes.  Diabetes - taking basaglar 25 units daily. a1c today 6.7. having some occasional lows, a couple times every few weeks. Down to 93s, he is symptomatic when this happens, resolves with eating. Having early satiety, not eating as much as he did years ago.  Health maintenance - reports having a colonoscopy by Dr. Benson Norway within the last year. Agreeable to flu shot today.  ROS: See HPI.  [place]: history of type 2 diabetes, BPH, hyperlipidemia, hypertension, monoclonal gammopathy  PHYSICAL EXAM: BP (!) 150/80   Pulse 69   Wt 167 lb 3.2 oz (75.8 kg)   SpO2 98%   BMI 24.69 kg/m  Gen: no acute distress, pleasant, cooperative HEENT: normocephalic, atraumatic. pupils equal round and reactive to light. R sclera diffusely injected without any perilimbal sparing. Extraocular movements intact.  Heart: regular rate and rhythm, no murmur Lungs: clear to auscultation bilaterally, normal work of breathing  Neuro: speech normal, grossly nonfocal Ext: No appreciable lower extremity edema bilaterally   ASSESSMENT/PLAN:  Health maintenance:  -flu shot given today   Elevated blood pressure - above goal today. Will have patient return for blood pressure check by RN in a week or two. If remains elevated will likely uptitrate lisinopril (currently on just for renal protection) vs adding another agent.  Painful red eye - concern for serious pathology, needs specialist evaluation urgently. Called and spoke with [facility]. They will see him immediately after his visit here.  Patient given contact info and address for their office and will go directly there.  T2DM (type 2 diabetes mellitus) ([place]) Well controlled by A1c's, with some lows. Decrease basaglar to 23 units daily. Patient to call if hypoglycemia continues, as we may decrease insulin further.   FOLLOW UP: Follow up in 1 week for RN blood pressure check Going directly to [place] J. Ardelia Mems, [place]

## 2019-11-23 DIAGNOSIS — H15101 Unspecified episcleritis, right eye: Secondary | ICD-10-CM | POA: Diagnosis not present

## 2019-11-24 ENCOUNTER — Other Ambulatory Visit: Payer: Self-pay

## 2019-11-24 ENCOUNTER — Ambulatory Visit (INDEPENDENT_AMBULATORY_CARE_PROVIDER_SITE_OTHER): Payer: Medicare HMO | Admitting: *Deleted

## 2019-11-24 VITALS — BP 138/76 | HR 67

## 2019-11-24 DIAGNOSIS — I10 Essential (primary) hypertension: Secondary | ICD-10-CM

## 2019-11-24 NOTE — Progress Notes (Signed)
Patient here today for BP check.      Last BP was on 11/16/2019 and was 150/80.  BP today is 138/76 with a pulse of 67.    Checked BP in right arm with large adult cuff.    Symptoms present: none  .   Patient last took BP med this morning.  Routed note to PCP.         Talbot Grumbling, RN

## 2019-11-24 NOTE — Progress Notes (Signed)
Blood pressure at goal, no changes to medications  Leeanne Rio, MD

## 2019-11-25 DIAGNOSIS — R972 Elevated prostate specific antigen [PSA]: Secondary | ICD-10-CM | POA: Diagnosis not present

## 2019-12-08 ENCOUNTER — Other Ambulatory Visit: Payer: Self-pay | Admitting: Family Medicine

## 2019-12-13 ENCOUNTER — Other Ambulatory Visit: Payer: Self-pay | Admitting: *Deleted

## 2019-12-22 DIAGNOSIS — H15001 Unspecified scleritis, right eye: Secondary | ICD-10-CM | POA: Diagnosis not present

## 2019-12-31 ENCOUNTER — Other Ambulatory Visit: Payer: Self-pay

## 2020-01-02 MED ORDER — RELION PEN NEEDLES 31G X 6 MM MISC: 5 refills | Status: DC

## 2020-01-12 ENCOUNTER — Telehealth: Payer: Self-pay | Admitting: Family Medicine

## 2020-01-12 NOTE — Telephone Encounter (Signed)
Called and LVM for patient to call office and schedule an appointment.  Richard Gill, [place]

## 2020-01-12 NOTE — Telephone Encounter (Signed)
Please schedule patient with PCP sometime in March-- needs kidney function checked (last in February 2020).    Thank you! [name], MD  Family Medicine Teaching Service

## 2020-02-01 DIAGNOSIS — H15001 Unspecified scleritis, right eye: Secondary | ICD-10-CM | POA: Diagnosis not present

## 2020-02-11 DIAGNOSIS — E1122 Type 2 diabetes mellitus with diabetic chronic kidney disease: Secondary | ICD-10-CM | POA: Diagnosis not present

## 2020-02-11 DIAGNOSIS — N183 Chronic kidney disease, stage 3 unspecified: Secondary | ICD-10-CM | POA: Diagnosis not present

## 2020-02-11 DIAGNOSIS — N2581 Secondary hyperparathyroidism of renal origin: Secondary | ICD-10-CM | POA: Diagnosis not present

## 2020-02-11 DIAGNOSIS — D472 Monoclonal gammopathy: Secondary | ICD-10-CM | POA: Diagnosis not present

## 2020-02-11 DIAGNOSIS — N189 Chronic kidney disease, unspecified: Secondary | ICD-10-CM | POA: Diagnosis not present

## 2020-02-11 DIAGNOSIS — N400 Enlarged prostate without lower urinary tract symptoms: Secondary | ICD-10-CM | POA: Diagnosis not present

## 2020-02-11 DIAGNOSIS — I129 Hypertensive chronic kidney disease with stage 1 through stage 4 chronic kidney disease, or unspecified chronic kidney disease: Secondary | ICD-10-CM | POA: Diagnosis not present

## 2020-02-29 DIAGNOSIS — E875 Hyperkalemia: Secondary | ICD-10-CM | POA: Diagnosis not present

## 2020-04-11 DIAGNOSIS — R69 Illness, unspecified: Secondary | ICD-10-CM | POA: Diagnosis not present

## 2020-04-13 ENCOUNTER — Encounter: Payer: Self-pay | Admitting: Family Medicine

## 2020-04-13 DIAGNOSIS — H52229 Regular astigmatism, unspecified eye: Secondary | ICD-10-CM | POA: Diagnosis not present

## 2020-04-13 DIAGNOSIS — E119 Type 2 diabetes mellitus without complications: Secondary | ICD-10-CM | POA: Diagnosis not present

## 2020-04-13 DIAGNOSIS — H259 Unspecified age-related cataract: Secondary | ICD-10-CM | POA: Diagnosis not present

## 2020-04-13 LAB — HM DIABETES EYE EXAM

## 2020-04-25 ENCOUNTER — Other Ambulatory Visit: Payer: Self-pay

## 2020-04-25 ENCOUNTER — Ambulatory Visit (INDEPENDENT_AMBULATORY_CARE_PROVIDER_SITE_OTHER): Payer: Medicare HMO | Admitting: Family Medicine

## 2020-04-25 VITALS — BP 148/80 | HR 57 | Ht 71.0 in | Wt 158.6 lb

## 2020-04-25 DIAGNOSIS — E785 Hyperlipidemia, unspecified: Secondary | ICD-10-CM | POA: Diagnosis not present

## 2020-04-25 DIAGNOSIS — E119 Type 2 diabetes mellitus without complications: Secondary | ICD-10-CM | POA: Diagnosis not present

## 2020-04-25 DIAGNOSIS — Z794 Long term (current) use of insulin: Secondary | ICD-10-CM | POA: Diagnosis not present

## 2020-04-25 LAB — POCT GLYCOSYLATED HEMOGLOBIN (HGB A1C): HbA1c, POC (controlled diabetic range): 6.9 % (ref 0.0–7.0)

## 2020-04-25 NOTE — Progress Notes (Signed)
  Date of Visit: 04/25/2020   SUBJECTIVE:   HPI:  Jedediah presents today for routine follow up.  Diabetes - currently taking basaglar 23 u daily and metformin 500mg  twice daily. Tolerating well. Most fasting sugars are in the 160s. Has had lows down to 58 though.   Hyperlipidemia - currently taking rosuvastatin 40mg  daily. Patient confirms he is taking this, despite it not being prescribed in 2 years.   COVID vaccine - has questions about COVID vaccination. Has contemplated getting it but is unsure.  OBJECTIVE:   BP (!) 148/80   Pulse (!) 57   Ht 5\' 11"  (1.803 m)   Wt 158 lb 9.6 oz (71.9 kg)   SpO2 100%   BMI 22.12 kg/m  Gen: no acute distress, pleasant cooperative HEENT: normocephalic, atraumatic  Heart: regular rate and rhythm, no murmur Lungs: clear to auscultation bilaterally, normal work of breathing  Neuro: alert, speech normal, grossly nonfocal Ext: No appreciable lower extremity edema bilaterally  Diabetic foot exam: 2+ DP pulses bilat, normal monofilament testing bilaterally. No lesions or significant calluses.   ASSESSMENT/PLAN:   Health maintenance:  -counseled extensively on COVID vaccination and encouraged patient to be vaccinated, gave info on how to receive vaccine -need colonoscopy report from Dr. Benson Norway - will request -normal foot exam today -will request eye records from [facility]  T2DM (type 2 diabetes mellitus) ([place]) A1c 6.9 today. Concern for low sugars. Will decrease basaglar to 23u daily. Advised to let me know if he has continued low sugars. Check CMET and lipids today.  Hyperlipidemia Refill statin Update lipids & CMET today  FOLLOW UP: Follow up in 6 months for above issues  [name], [place]

## 2020-04-25 NOTE — Patient Instructions (Addendum)
It was great to see you again today!  Checking labs today Go down to 20u of your basaglar  You can schedule your COVID vaccine at [email or web address]  [facility]  [facility]) [place] [employer] [address], [place] [address] Hours: Thu: 1-5 Type: Pfizer (12+)  [place] [place], [place] [address] Hours: Mon,Thu 8-5, Sat 8-12 Type: [place] (12+)  [facility]) [address] [employer] [address], [address] [place], [place] [address] Hours: Fri,Sat 8-12, [place] 8-4 Type: Pfizer (12+)  Be well, Dr. Ardelia Mems

## 2020-04-26 ENCOUNTER — Other Ambulatory Visit: Payer: Self-pay | Admitting: *Deleted

## 2020-04-26 ENCOUNTER — Other Ambulatory Visit: Payer: Self-pay | Admitting: Family Medicine

## 2020-04-26 ENCOUNTER — Telehealth: Payer: Self-pay | Admitting: Family Medicine

## 2020-04-26 DIAGNOSIS — E119 Type 2 diabetes mellitus without complications: Secondary | ICD-10-CM

## 2020-04-26 DIAGNOSIS — R748 Abnormal levels of other serum enzymes: Secondary | ICD-10-CM

## 2020-04-26 LAB — CMP14+EGFR
ALT: 21 IU/L (ref 0–44)
AST: 23 IU/L (ref 0–40)
Albumin/Globulin Ratio: 1.3 (ref 1.2–2.2)
Albumin: 4.4 g/dL (ref 3.8–4.8)
Alkaline Phosphatase: 145 IU/L — ABNORMAL HIGH (ref 48–121)
BUN/Creatinine Ratio: 14 (ref 10–24)
BUN: 34 mg/dL — ABNORMAL HIGH (ref 8–27)
Bilirubin Total: 0.4 mg/dL (ref 0.0–1.2)
CO2: 17 mmol/L — ABNORMAL LOW (ref 20–29)
Calcium: 9 mg/dL (ref 8.6–10.2)
Chloride: 106 mmol/L (ref 96–106)
Creatinine, Ser: 2.36 mg/dL — ABNORMAL HIGH (ref 0.76–1.27)
GFR calc Af Amer: 32 mL/min/{1.73_m2} — ABNORMAL LOW (ref >59)
GFR calc non Af Amer: 28 mL/min/{1.73_m2} — ABNORMAL LOW (ref >59)
Globulin, Total: 3.3 g/dL (ref 1.5–4.5)
Glucose: 232 mg/dL — ABNORMAL HIGH (ref 65–99)
Potassium: 5.1 mmol/L (ref 3.5–5.2)
Sodium: 137 mmol/L (ref 134–144)
Total Protein: 7.7 g/dL (ref 6.0–8.5)

## 2020-04-26 LAB — LIPID PANEL
Chol/HDL Ratio: 4.3 ratio (ref 0.0–5.0)
Cholesterol, Total: 152 mg/dL (ref 100–199)
HDL: 35 mg/dL — ABNORMAL LOW (ref >39)
LDL Chol Calc (NIH): 104 mg/dL — ABNORMAL HIGH (ref 0–99)
Triglycerides: 63 mg/dL (ref 0–149)
VLDL Cholesterol Cal: 13 mg/dL (ref 5–40)

## 2020-04-26 NOTE — Telephone Encounter (Signed)
Called and spoke with patient about his elevated creatinine He admits to not hydrating as much recently Advised hydrating, follow up in 2 days to recheck creatinine  If remains elevated will need to adjust or discontinue his metformin  Patient agreeable - lab visit scheduled  Also will recheck alk phos and GGT since this was up (has been mildly elevated in past)  Patient appreciative Leeanne Rio, MD

## 2020-04-27 MED ORDER — BASAGLAR KWIKPEN 100 UNIT/ML ~~LOC~~ SOPN: 20.0000 [IU] | PEN_INJECTOR | Freq: Every day | SUBCUTANEOUS | Status: DC

## 2020-04-27 MED ORDER — ROSUVASTATIN CALCIUM 40 MG PO TABS: 40.0000 mg | ORAL_TABLET | Freq: Every day | ORAL | 3 refills | Status: DC

## 2020-04-27 MED ORDER — METFORMIN HCL 500 MG PO TABS: ORAL_TABLET | ORAL | 0 refills | Status: DC

## 2020-04-27 NOTE — Assessment & Plan Note (Signed)
Refill statin Update lipids & CMET today

## 2020-04-27 NOTE — Assessment & Plan Note (Addendum)
A1c 6.9 today. Concern for low sugars. Will decrease basaglar to 23u daily. Advised to let me know if he has continued low sugars. Check CMET and lipids today.

## 2020-04-28 ENCOUNTER — Other Ambulatory Visit: Payer: Self-pay

## 2020-04-28 ENCOUNTER — Other Ambulatory Visit: Payer: Medicare HMO

## 2020-04-28 DIAGNOSIS — R748 Abnormal levels of other serum enzymes: Secondary | ICD-10-CM

## 2020-04-29 LAB — CMP14+EGFR
ALT: 17 IU/L (ref 0–44)
AST: 17 IU/L (ref 0–40)
Albumin/Globulin Ratio: 1.2 (ref 1.2–2.2)
Albumin: 4.1 g/dL (ref 3.8–4.8)
Alkaline Phosphatase: 136 IU/L — ABNORMAL HIGH (ref 48–121)
BUN/Creatinine Ratio: 17 (ref 10–24)
BUN: 38 mg/dL — ABNORMAL HIGH (ref 8–27)
Bilirubin Total: 0.4 mg/dL (ref 0.0–1.2)
CO2: 17 mmol/L — ABNORMAL LOW (ref 20–29)
Calcium: 8.7 mg/dL (ref 8.6–10.2)
Chloride: 106 mmol/L (ref 96–106)
Creatinine, Ser: 2.29 mg/dL — ABNORMAL HIGH (ref 0.76–1.27)
GFR calc Af Amer: 33 mL/min/{1.73_m2} — ABNORMAL LOW (ref >59)
GFR calc non Af Amer: 29 mL/min/{1.73_m2} — ABNORMAL LOW (ref >59)
Globulin, Total: 3.4 g/dL (ref 1.5–4.5)
Glucose: 141 mg/dL — ABNORMAL HIGH (ref 65–99)
Potassium: 5.2 mmol/L (ref 3.5–5.2)
Sodium: 137 mmol/L (ref 134–144)
Total Protein: 7.5 g/dL (ref 6.0–8.5)

## 2020-04-29 LAB — GAMMA GT: GGT: 24 IU/L (ref 0–65)

## 2020-05-01 ENCOUNTER — Ambulatory Visit: Payer: Medicare HMO | Attending: Internal Medicine

## 2020-05-01 DIAGNOSIS — R319 Hematuria, unspecified: Secondary | ICD-10-CM | POA: Diagnosis not present

## 2020-05-01 DIAGNOSIS — D472 Monoclonal gammopathy: Secondary | ICD-10-CM | POA: Diagnosis not present

## 2020-05-01 DIAGNOSIS — Z23 Encounter for immunization: Secondary | ICD-10-CM

## 2020-05-01 DIAGNOSIS — C9000 Multiple myeloma not having achieved remission: Secondary | ICD-10-CM | POA: Diagnosis not present

## 2020-05-01 DIAGNOSIS — R6889 Other general symptoms and signs: Secondary | ICD-10-CM | POA: Diagnosis not present

## 2020-05-01 NOTE — Progress Notes (Signed)
   [id number]  Name:  Richard Gill.    MRN: [id number] DOB: 1956/05/25  05/01/2020  Mr. Richard Gill was observed post Covid-19 immunization for 30 minutes based on pre-vaccination screening without incident. He was provided with Vaccine Information Sheet and instruction to access the V-Safe system.   Mr. Richard Gill was instructed to call 911 with any severe reactions post vaccine: Marland Kitchen Difficulty breathing  . Swelling of face and throat  . A fast heartbeat  . A bad rash all over body  . Dizziness and weakness   Immunizations Administered    Name Date Dose VIS Date Route   Pfizer COVID-19 Vaccine 05/01/2020 12:51 PM 0.3 mL 02/02/2019 Intramuscular   Manufacturer: [employer]   Lot: [id number]   [place]: [address]-4

## 2020-05-04 ENCOUNTER — Other Ambulatory Visit: Payer: Self-pay | Admitting: Family Medicine

## 2020-05-10 DIAGNOSIS — R310 Gross hematuria: Secondary | ICD-10-CM | POA: Diagnosis not present

## 2020-05-12 ENCOUNTER — Telehealth: Payer: Self-pay | Admitting: Family Medicine

## 2020-05-12 NOTE — Telephone Encounter (Signed)
Called patient about labs - creat remained elevated on recheck Given overall trend, will stop metformin He is on glargine so we can always increase that if needed Patient agreeable  Alk phos remains very mildly elevated, GGT normal. Will just monitor over time.   Leeanne Rio, MD

## 2020-05-15 ENCOUNTER — Other Ambulatory Visit: Payer: Self-pay | Admitting: Urology

## 2020-05-15 DIAGNOSIS — R310 Gross hematuria: Secondary | ICD-10-CM

## 2020-05-22 ENCOUNTER — Ambulatory Visit: Payer: Medicare HMO | Attending: Internal Medicine

## 2020-05-22 DIAGNOSIS — Z23 Encounter for immunization: Secondary | ICD-10-CM

## 2020-05-22 NOTE — Progress Notes (Signed)
° °  [id number]  Name:  Richard Gill.    MRN: [id number] DOB: [birth year]  05/22/2020  Richard Gill was observed post Covid-19 immunization for 30 minutes based on pre-vaccination screening without incident. He was provided with Vaccine Information Sheet and instruction to access the V-Safe system.   Richard Gill was instructed to call 911 with any severe reactions post vaccine:  Difficulty breathing   Swelling of face and throat   A fast heartbeat   A bad rash all over body   Dizziness and weakness   Immunizations Administered    Name Date Dose VIS Date Route   Pfizer COVID-19 Vaccine 05/22/2020 11:35 AM 0.3 mL 02/02/2019 Intramuscular   Manufacturer: [employer]   Lot: [id number]   [place]: [address]-1

## 2020-05-24 ENCOUNTER — Encounter: Payer: Self-pay | Admitting: Family Medicine

## 2020-05-24 DIAGNOSIS — H2513 Age-related nuclear cataract, bilateral: Secondary | ICD-10-CM | POA: Diagnosis not present

## 2020-05-24 DIAGNOSIS — H52203 Unspecified astigmatism, bilateral: Secondary | ICD-10-CM | POA: Diagnosis not present

## 2020-05-24 DIAGNOSIS — E103292 Type 1 diabetes mellitus with mild nonproliferative diabetic retinopathy without macular edema, left eye: Secondary | ICD-10-CM | POA: Diagnosis not present

## 2020-05-24 LAB — HM DIABETES EYE EXAM

## 2020-06-13 ENCOUNTER — Other Ambulatory Visit: Payer: Medicare HMO

## 2020-06-13 DIAGNOSIS — D472 Monoclonal gammopathy: Secondary | ICD-10-CM | POA: Diagnosis not present

## 2020-06-13 DIAGNOSIS — N183 Chronic kidney disease, stage 3 unspecified: Secondary | ICD-10-CM | POA: Diagnosis not present

## 2020-06-13 DIAGNOSIS — I129 Hypertensive chronic kidney disease with stage 1 through stage 4 chronic kidney disease, or unspecified chronic kidney disease: Secondary | ICD-10-CM | POA: Diagnosis not present

## 2020-06-13 DIAGNOSIS — N400 Enlarged prostate without lower urinary tract symptoms: Secondary | ICD-10-CM | POA: Diagnosis not present

## 2020-06-13 DIAGNOSIS — E1122 Type 2 diabetes mellitus with diabetic chronic kidney disease: Secondary | ICD-10-CM | POA: Diagnosis not present

## 2020-06-13 DIAGNOSIS — N2581 Secondary hyperparathyroidism of renal origin: Secondary | ICD-10-CM | POA: Diagnosis not present

## 2020-06-13 DIAGNOSIS — N189 Chronic kidney disease, unspecified: Secondary | ICD-10-CM | POA: Diagnosis not present

## 2020-07-16 ENCOUNTER — Other Ambulatory Visit: Payer: Self-pay | Admitting: Family Medicine

## 2020-07-27 ENCOUNTER — Other Ambulatory Visit: Payer: Self-pay | Admitting: Urology

## 2020-07-27 ENCOUNTER — Other Ambulatory Visit: Payer: Medicare HMO

## 2020-07-27 DIAGNOSIS — R310 Gross hematuria: Secondary | ICD-10-CM

## 2020-08-20 ENCOUNTER — Other Ambulatory Visit: Payer: Medicare HMO

## 2020-08-20 ENCOUNTER — Inpatient Hospital Stay: Admission: RE | Admit: 2020-08-20 | Payer: Medicare HMO | Source: Ambulatory Visit

## 2020-10-18 DIAGNOSIS — E1122 Type 2 diabetes mellitus with diabetic chronic kidney disease: Secondary | ICD-10-CM | POA: Diagnosis not present

## 2020-10-18 DIAGNOSIS — N400 Enlarged prostate without lower urinary tract symptoms: Secondary | ICD-10-CM | POA: Diagnosis not present

## 2020-10-18 DIAGNOSIS — D472 Monoclonal gammopathy: Secondary | ICD-10-CM | POA: Diagnosis not present

## 2020-10-18 DIAGNOSIS — N189 Chronic kidney disease, unspecified: Secondary | ICD-10-CM | POA: Diagnosis not present

## 2020-10-18 DIAGNOSIS — N2581 Secondary hyperparathyroidism of renal origin: Secondary | ICD-10-CM | POA: Diagnosis not present

## 2020-10-18 DIAGNOSIS — N183 Chronic kidney disease, stage 3 unspecified: Secondary | ICD-10-CM | POA: Diagnosis not present

## 2020-10-18 DIAGNOSIS — I129 Hypertensive chronic kidney disease with stage 1 through stage 4 chronic kidney disease, or unspecified chronic kidney disease: Secondary | ICD-10-CM | POA: Diagnosis not present

## 2020-10-30 DIAGNOSIS — R6889 Other general symptoms and signs: Secondary | ICD-10-CM | POA: Diagnosis not present

## 2020-10-30 DIAGNOSIS — C9000 Multiple myeloma not having achieved remission: Secondary | ICD-10-CM | POA: Diagnosis not present

## 2020-10-30 DIAGNOSIS — D472 Monoclonal gammopathy: Secondary | ICD-10-CM | POA: Diagnosis not present

## 2020-10-30 DIAGNOSIS — R911 Solitary pulmonary nodule: Secondary | ICD-10-CM | POA: Diagnosis not present

## 2020-10-30 DIAGNOSIS — N189 Chronic kidney disease, unspecified: Secondary | ICD-10-CM | POA: Diagnosis not present

## 2020-11-14 ENCOUNTER — Ambulatory Visit (INDEPENDENT_AMBULATORY_CARE_PROVIDER_SITE_OTHER): Payer: Medicare HMO | Admitting: Family Medicine

## 2020-11-14 ENCOUNTER — Other Ambulatory Visit: Payer: Self-pay

## 2020-11-14 ENCOUNTER — Encounter: Payer: Self-pay | Admitting: Family Medicine

## 2020-11-14 VITALS — BP 131/70 | HR 73 | Ht 73.0 in | Wt 171.4 lb

## 2020-11-14 DIAGNOSIS — Z23 Encounter for immunization: Secondary | ICD-10-CM | POA: Diagnosis not present

## 2020-11-14 DIAGNOSIS — E119 Type 2 diabetes mellitus without complications: Secondary | ICD-10-CM

## 2020-11-14 DIAGNOSIS — Z794 Long term (current) use of insulin: Secondary | ICD-10-CM | POA: Diagnosis not present

## 2020-11-14 DIAGNOSIS — N182 Chronic kidney disease, stage 2 (mild): Secondary | ICD-10-CM

## 2020-11-14 DIAGNOSIS — L299 Pruritus, unspecified: Secondary | ICD-10-CM | POA: Diagnosis not present

## 2020-11-14 LAB — POCT GLYCOSYLATED HEMOGLOBIN (HGB A1C): Hemoglobin A1C: 6.8 % — AB (ref 4.0–5.6)

## 2020-11-14 MED ORDER — HYDROXYZINE HCL 25 MG PO TABS: 25.0000 mg | ORAL_TABLET | Freq: Every day | ORAL | 2 refills | Status: DC | PRN

## 2020-11-14 MED ORDER — BASAGLAR KWIKPEN 100 UNIT/ML ~~LOC~~ SOPN: 25.0000 [IU] | PEN_INJECTOR | Freq: Every day | SUBCUTANEOUS | 3 refills | Status: DC

## 2020-11-14 NOTE — Assessment & Plan Note (Addendum)
A1c at goal. Congratulated on progress. However, given his am readings in the 60s, will reduce basaglar dose to 25u daily. Advised to check sugars and call if sugars remain in the 70s, as we will need to decrease lantus further.

## 2020-11-14 NOTE — Patient Instructions (Signed)
Refilled medications - basaglar and hydroxyzine  Go down to 25u of the basaglar If sugars under 80s repeatedly please call us  Follow up in 3 months, sooner if needed  Be well, Dr. Ardelia Mems

## 2020-11-14 NOTE — Progress Notes (Signed)
° ° °  SUBJECTIVE:   CHIEF COMPLAINT / HPI:   Diabetes:  Recently started intermittent fasting for glucose control, reports that his sugars have been running 60s - 150s in the morning. He has self-titrated his basaglar dose up to 30u per day from the 20u per day he is prescribed. A1c 6.8 today, down from 6.9 at his last visit.   Itching Requests refill of hydroxyzine. Takes daily as needed for itching, has been on this for years. Overall stable.    OBJECTIVE:   BP 131/70    Pulse 73    Ht 6\' 1"  (1.854 m)    Wt 171 lb 6.4 oz (77.7 kg)    SpO2 97%    BMI 22.61 kg/m   Physical Exam Vitals reviewed.  Constitutional:      General: He is not in acute distress. Cardiovascular:     Rate and Rhythm: Normal rate and regular rhythm.     Heart sounds: Normal heart sounds.  Pulmonary:     Effort: Pulmonary effort is normal.     Breath sounds: Normal breath sounds.    ASSESSMENT/PLAN:   Health Maintenance: -high dose flu shot given today -advised eligible for COVID booster on 12/14  T2DM (type 2 diabetes mellitus) ([place]) A1c at goal. Congratulated on progress. However, given his am readings in the 60s, will reduce basaglar dose to 25u daily. Advised to check sugars and call if sugars remain in the 70s, as we will need to decrease lantus further.  Itching Stable on hydroxyzine, refilled  Chronic kidney disease, stage II (mild) Will request nephrology records, patient last saw Dr. Justin Mend in the last few months but I have not received records.    Pearla Dubonnet, [place]   Patient seen along with MS3 student Pearla Dubonnet. I personally evaluated this patient along with the student, and verified all aspects of the history, physical exam, and medical decision making as documented by the student. I agree with the student's documentation and have made all necessary edits.  Chrisandra Netters, MD  [place]

## 2020-11-17 DIAGNOSIS — L299 Pruritus, unspecified: Secondary | ICD-10-CM | POA: Insufficient documentation

## 2020-11-17 NOTE — Assessment & Plan Note (Signed)
Will request nephrology records, patient last saw Dr. Justin Mend in the last few months but I have not received records.

## 2020-11-17 NOTE — Assessment & Plan Note (Signed)
Stable on hydroxyzine, refilled

## 2020-12-05 ENCOUNTER — Other Ambulatory Visit: Payer: Self-pay

## 2020-12-05 ENCOUNTER — Ambulatory Visit (INDEPENDENT_AMBULATORY_CARE_PROVIDER_SITE_OTHER): Payer: Medicare HMO

## 2020-12-05 DIAGNOSIS — Z23 Encounter for immunization: Secondary | ICD-10-CM | POA: Diagnosis not present

## 2020-12-05 NOTE — Progress Notes (Signed)
   [id number]  Name:  Richard Gill.    MRN: [id number] DOB: 1956-04-07  12/05/2020   Patient presents to nurse clinic for [place] booster. Verified that second dose was at least 6 months ago. Patient answered no to all screening questions. Administered in RD, site unremarkable, tolerated injection well.   [name] was observed post Covid-19 immunization for 15 minutes without incident. He was provided with Vaccine Information Sheet and instruction to access the V-Safe system.   [name] was instructed to call 911 with any severe reactions post vaccine: Marland Kitchen Difficulty breathing  . Swelling of face and throat  . A fast heartbeat  . A bad rash all over body  . Dizziness and weakness  Provided patient with updated immunization record and card.   Talbot Grumbling, RN

## 2021-01-27 ENCOUNTER — Other Ambulatory Visit: Payer: Self-pay | Admitting: Family Medicine

## 2021-02-13 DIAGNOSIS — N189 Chronic kidney disease, unspecified: Secondary | ICD-10-CM | POA: Diagnosis not present

## 2021-02-13 DIAGNOSIS — D472 Monoclonal gammopathy: Secondary | ICD-10-CM | POA: Diagnosis not present

## 2021-02-13 DIAGNOSIS — N2581 Secondary hyperparathyroidism of renal origin: Secondary | ICD-10-CM | POA: Diagnosis not present

## 2021-02-13 DIAGNOSIS — I129 Hypertensive chronic kidney disease with stage 1 through stage 4 chronic kidney disease, or unspecified chronic kidney disease: Secondary | ICD-10-CM | POA: Diagnosis not present

## 2021-02-13 DIAGNOSIS — N183 Chronic kidney disease, stage 3 unspecified: Secondary | ICD-10-CM | POA: Diagnosis not present

## 2021-02-13 DIAGNOSIS — N400 Enlarged prostate without lower urinary tract symptoms: Secondary | ICD-10-CM | POA: Diagnosis not present

## 2021-02-13 DIAGNOSIS — E1122 Type 2 diabetes mellitus with diabetic chronic kidney disease: Secondary | ICD-10-CM | POA: Diagnosis not present

## 2021-02-28 ENCOUNTER — Other Ambulatory Visit: Payer: Self-pay | Admitting: Family Medicine

## 2021-04-12 ENCOUNTER — Ambulatory Visit (INDEPENDENT_AMBULATORY_CARE_PROVIDER_SITE_OTHER): Payer: Medicare HMO

## 2021-04-12 ENCOUNTER — Other Ambulatory Visit: Payer: Self-pay

## 2021-04-12 ENCOUNTER — Encounter: Payer: Self-pay | Admitting: Family Medicine

## 2021-04-12 ENCOUNTER — Ambulatory Visit (INDEPENDENT_AMBULATORY_CARE_PROVIDER_SITE_OTHER): Payer: Medicare HMO | Admitting: Family Medicine

## 2021-04-12 VITALS — BP 137/75 | HR 75 | Ht 71.0 in | Wt 166.2 lb

## 2021-04-12 DIAGNOSIS — E119 Type 2 diabetes mellitus without complications: Secondary | ICD-10-CM | POA: Diagnosis not present

## 2021-04-12 DIAGNOSIS — Z794 Long term (current) use of insulin: Secondary | ICD-10-CM | POA: Diagnosis not present

## 2021-04-12 DIAGNOSIS — E785 Hyperlipidemia, unspecified: Secondary | ICD-10-CM

## 2021-04-12 DIAGNOSIS — Z23 Encounter for immunization: Secondary | ICD-10-CM | POA: Diagnosis not present

## 2021-04-12 DIAGNOSIS — R748 Abnormal levels of other serum enzymes: Secondary | ICD-10-CM | POA: Diagnosis not present

## 2021-04-12 LAB — POCT GLYCOSYLATED HEMOGLOBIN (HGB A1C): HbA1c, POC (controlled diabetic range): 8.3 % — AB (ref 0.0–7.0)

## 2021-04-12 MED ORDER — EMPAGLIFLOZIN 10 MG PO TABS: 10.0000 mg | ORAL_TABLET | Freq: Every day | ORAL | 3 refills | Status: DC

## 2021-04-12 NOTE — Progress Notes (Signed)
  Date of Visit: 04/12/2021   SUBJECTIVE:   HPI:  Richard Gill presents today for routine follow up.  Diabetes - taking lantus 25u daily. fastings run 175s most mornings. Later in the day he is having low sugars. This morning he had a sugar of 53, but this was unusual for him. Typically has sugars under 80 about 3-4 times a month, when he has not eaten much.  Hyperlipidemia - taking crestor 40mg  daily and tolerating well.   OBJECTIVE:   BP 137/75   Pulse 75   Ht 5\' 11"  (1.803 m)   Wt 166 lb 3.2 oz (75.4 kg)   SpO2 96%   BMI 23.18 kg/m  Gen: no acute distress, pleasant cooperative HEENT: normocephalic, atraumatic  Heart: regular rate and rhythm, no murmur Lungs: clear to auscultation bilaterally, normal work of breathing  Neuro: alert, speech normal, grossly nonfocal Ext: No appreciable lower extremity edema bilaterally  Diabetic foot exam: 2+ DP pulses bilat, normal monofilament testing bilaterally. No lesions or significant calluses.   ASSESSMENT/PLAN:   Health maintenance:  -normal foot exam today -COVID booster #2 given today  T2DM (type 2 diabetes mellitus) ([place]) A1c elevated at 8.3. Having low sugars, when he works hard and doesn't eat, but he is willing to make changes to prevent this from continuing to happen. Discussed options for treatment. Elected to start jardiance 10mg  daily for renal protective benefit. Follow up in 3 months for next A1c. Patient to let us know if having continued low sugars. Discussed holding SGLT2 during times of dehydration.  Hyperlipidemia Update direct LDL today, continue crestor  FOLLOW UP: Follow up in 3 mos for next A1c  Richard Gill, [name]

## 2021-04-12 NOTE — Patient Instructions (Addendum)
Checking labs today COVID booster today  Start jardiance 10mg  daily Don't take it if you get dehydrated, have vomiting, diarrhea, etc.  Follow up with me in 3 months  Be well, Dr. Ardelia Mems

## 2021-04-13 LAB — CMP14+EGFR
ALT: 37 IU/L (ref 0–44)
AST: 35 IU/L (ref 0–40)
Albumin/Globulin Ratio: 1.3 (ref 1.2–2.2)
Albumin: 4.3 g/dL (ref 3.8–4.8)
Alkaline Phosphatase: 161 IU/L — ABNORMAL HIGH (ref 44–121)
BUN/Creatinine Ratio: 8 — ABNORMAL LOW (ref 10–24)
BUN: 19 mg/dL (ref 8–27)
Bilirubin Total: 0.6 mg/dL (ref 0.0–1.2)
CO2: 18 mmol/L — ABNORMAL LOW (ref 20–29)
Calcium: 8.9 mg/dL (ref 8.6–10.2)
Chloride: 106 mmol/L (ref 96–106)
Creatinine, Ser: 2.37 mg/dL — ABNORMAL HIGH (ref 0.76–1.27)
Globulin, Total: 3.4 g/dL (ref 1.5–4.5)
Glucose: 147 mg/dL — ABNORMAL HIGH (ref 65–99)
Potassium: 5.2 mmol/L (ref 3.5–5.2)
Sodium: 137 mmol/L (ref 134–144)
Total Protein: 7.7 g/dL (ref 6.0–8.5)
eGFR: 29 mL/min/{1.73_m2} — ABNORMAL LOW (ref >59)

## 2021-04-13 LAB — LDL CHOLESTEROL, DIRECT: LDL Direct: 77 mg/dL (ref 0–99)

## 2021-04-16 NOTE — Assessment & Plan Note (Signed)
A1c elevated at 8.3. Having low sugars, when he works hard and doesn't eat, but he is willing to make changes to prevent this from continuing to happen. Discussed options for treatment. Elected to start jardiance 10mg  daily for renal protective benefit. Follow up in 3 months for next A1c. Patient to let us know if having continued low sugars. Discussed holding SGLT2 during times of dehydration.

## 2021-04-16 NOTE — Assessment & Plan Note (Signed)
Update direct LDL today, continue crestor

## 2021-04-18 ENCOUNTER — Ambulatory Visit (INDEPENDENT_AMBULATORY_CARE_PROVIDER_SITE_OTHER): Payer: Medicare HMO

## 2021-04-18 VITALS — Ht 71.0 in | Wt 168.0 lb

## 2021-04-18 DIAGNOSIS — Z0000 Encounter for general adult medical examination without abnormal findings: Secondary | ICD-10-CM | POA: Diagnosis not present

## 2021-04-18 NOTE — Progress Notes (Signed)
Subjective:   Richard Bethel. is a 65 y.o. male who presents for Medicare Annual/Subsequent preventive examination.  The patient consented to a virtual visit. Patient consented to have virtual visit and was identified by name and date of birth. Method of visit: Telephone   Encounter participants: Patient: Richard Gill. - located at Home Nurse/Provider: Dorna Bloom - located at [facility] Others (if applicable): NA  Review of Systems: Defer to PCP.  Cardiac Risk Factors include: advanced age (>78men, >95 women);diabetes mellitus;male gender  Objective:    Vitals: Ht 5\' 11"  (1.803 m)   Wt 168 lb (76.2 kg)   BMI 23.43 kg/m   Body mass index is 23.43 kg/m.  Advanced Directives 04/18/2021 11/16/2019 05/01/2018 10/10/2017 03/27/2017 11/18/2016 07/22/2016  Does Patient Have a Medical Advance Directive? No No No No No No No  Would patient like information on creating a medical advance directive? No - Patient declined No - Patient declined No - Patient declined No - Patient declined No - Patient declined - No - patient declined information  Pre-existing out of facility DNR order (yellow form or pink MOST form) - - - - - - -   Tobacco Social History   Tobacco Use  Smoking Status Former Smoker  . Packs/day: 1.00  . Years: 10.00  . Pack years: 10.00  . Quit date: 01/28/1991  . Years since quitting: 30.2  Smokeless Tobacco Never Used     Counseling given: No plans to restart  Clinical Intake:  Pre-visit preparation completed: Yes  Pain Score: 0-No pain  How often do you need to have someone help you when you read instructions, pamphlets, or other written materials from your doctor or pharmacy?: 2 - Rarely What is the last grade level you completed in school?: High School  Past Medical History:  Diagnosis Date  . BPH (benign prostatic hyperplasia)   . Chronic kidney disease, stage II (mild)    Dr. Justin Mend follows- holding steady  . Hyperlipidemia   . 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   . Prostatitis   . Sickle cell trait ([place])   . T2DM (type 2 diabetes mellitus) ([place]) 2005   lantus x1 year   Past Surgical History:  Procedure Laterality Date  . BUNIONECTOMY Bilateral 08-25-13  . COLONOSCOPY  2014   Dr. Benson Norway, was told to f/u in 2017-2019  . TRANSURETHRAL RESECTION OF PROSTATE N/A 09/06/2013   Procedure: TRANSURETHRAL RESECTION OF THE PROSTATE WITH GYRUS INSTRUMENTS;  Surgeon: [name], MD;  Location: [facility];  Service: Urology;  Laterality: N/A;   Family History  Problem Relation Age of Onset  . Heart attack Father 66  . Stroke Mother   . Colon cancer Sister 2  . Colon cancer Sister 82   Social History   Socioeconomic History  . Marital status: Married    Spouse name: Adela Lank   . Number of children: 8  . Years of education: 23  . Highest education level: 12th grade  Occupational History    Employer: [employer]    Comment: Building maintenance  Tobacco Use  . Smoking status: Former Smoker    Packs/day: 1.00    Years: 10.00    Pack years: 10.00    Quit date: 01/28/1991    Years since quitting: 30.2  . Smokeless tobacco: Never Used  Vaping Use  . Vaping Use: Never used  Substance and Sexual Activity  . Alcohol use: No  .  Drug use: No  . Sexual activity: Yes    Partners: Female    Birth control/protection: Post-menopausal  Other Topics Concern  . Not on file  Social History Narrative   Lives with wife Freda Munro in [place].     Enjoys fishing, gardening, working on classic cars, and spending time with family.    Patient has 8 children combined with his wife.    Patient has 1 fish and 1 dog.   High school graduate. Former smoker, quit >20 years ago. No drugs or EtOH. No regular exercise.   Social Determinants of Health   Financial Resource Strain: Low Risk   . Difficulty of Paying Living Expenses: Not hard at all  Food Insecurity: No Food Insecurity  . Worried  About Charity fundraiser in the Last Year: Never true  . Ran Out of Food in the Last Year: Never true  Transportation Needs: No Transportation Needs  . Lack of Transportation (Medical): No  . Lack of Transportation (Non-Medical): No  Physical Activity: Sufficiently Active  . Days of Exercise per Week: 5 days  . Minutes of Exercise per Session: 30 min  Stress: No Stress Concern Present  . Feeling of Stress : Not at all  Social Connections: Socially Integrated  . Frequency of Communication with Friends and Family: More than three times a week  . Frequency of Social Gatherings with Friends and Family: More than three times a week  . Attends Religious Services: More than 4 times per year  . Active Member of Clubs or Organizations: Yes  . Attends Archivist Meetings: More than 4 times per year  . Marital Status: Married   Outpatient Encounter Medications as of 04/18/2021  Medication Sig  . empagliflozin (JARDIANCE) 10 MG TABS tablet Take 1 tablet (10 mg total) by mouth daily.  Marland Kitchen glucose blood (RELION GLUCOSE TEST STRIPS) test strip Check sugar 3 times daily  . hydrOXYzine (ATARAX/VISTARIL) 25 MG tablet Take 1 tablet (25 mg total) by mouth daily as needed.  . Insulin Glargine (BASAGLAR KWIKPEN) 100 UNIT/ML Inject 25 Units into the skin daily.  . Insulin Pen Needle (RELION PEN NEEDLES) 31G X 6 MM MISC USE 1  ONCE DAILY  . Lancets (ACCU-CHEK SOFT TOUCH) lancets Use as instructed  . lisinopril (ZESTRIL) 5 MG tablet Take 1 tablet by mouth once daily  . Misc Natural Products (PROSTATE) CAPS Take 2 capsules by mouth 2 (two) times daily. Prostate Strong  . rosuvastatin (CRESTOR) 40 MG tablet Take 1 tablet (40 mg total) by mouth daily.  . tamsulosin (FLOMAX) 0.4 MG CAPS capsule Take 2 capsules (0.8 mg total) by mouth daily. (Patient taking differently: Take 0.4 mg by mouth daily.)   No facility-administered encounter medications on file as of 04/18/2021.   Activities of Daily Living In  your present state of health, do you have any difficulty performing the following activities: 04/18/2021  Hearing? N  Vision? N  Difficulty concentrating or making decisions? N  Walking or climbing stairs? N  Dressing or bathing? N  Doing errands, shopping? N  Preparing Food and eating ? N  Using the Toilet? N  In the past six months, have you accidently leaked urine? N  Do you have problems with loss of bowel control? N  Managing your Medications? N  Managing your Finances? N  Housekeeping or managing your Housekeeping? N  Some recent data might be hidden   Patient Care Team: Leeanne Rio, MD as PCP - General (Pediatrics) Melba Coon,  Collier Salina, MD as PCP - Hematology/Oncology (Internal Medicine) Edrick Oh, MD (Nephrology) Nobie Putnam, MD (Hematology and Oncology) Kathie Rhodes, MD (Inactive) as Attending Physician (Urology) Carol Ada, MD as Attending Physician (Gastroenterology)    Assessment:   This is a routine wellness examination for Massimiliano.  Exercise Activities and Dietary recommendations Current Exercise Habits: The patient has a physically strenuous job, but has no regular exercise apart from work., Exercise limited by: None identified  Goals    . HEMOGLOBIN A1C < 7     Patient continues to strive for an A1C<7. He was doing well until last check on 04/12/2021 of 8.3. Patient to cut back on sugary drinks.       Fall Risk Fall Risk  04/18/2021 11/16/2019 10/10/2017  Falls in the past year? 0 0 No  Number falls in past yr: - 0 -  Follow up Falls prevention discussed - -   Is the patient's home free of loose throw rugs in walkways, pet beds, electrical cords, etc?   yes      Grab bars in the bathroom? yes      Handrails on the stairs?   yes      Adequate lighting?   yes  Patient rating of health (0-10): 10  Depression Screen PHQ 2/9 Scores 04/18/2021 04/12/2021 11/14/2020 11/16/2019  PHQ - 2 Score 0 0 0 0  PHQ- 9 Score - 0 0 -   Cognitive Function  6CIT  Screen 04/18/2021  What Year? 0 points  What month? 0 points  What time? 0 points  Count back from 20 0 points  Months in reverse 0 points  Repeat phrase 0 points  Total Score 0   Immunization History  Administered Date(s) Administered  . Fluad Quad(high Dose 65+) 11/16/2019, 11/14/2020  . PFIZER Comirnaty(Gray Top)Covid-19 Tri-Sucrose Vaccine 04/12/2021  . PFIZER(Purple Top)SARS-COV-2 Vaccination 05/01/2020, 05/22/2020, 12/05/2020  . Pneumococcal Conjugate-13 05/01/2018  . Pneumococcal Polysaccharide-23 07/06/2018   Screening Tests Health Maintenance  Topic Date Due  . OPHTHALMOLOGY EXAM  05/24/2021  . INFLUENZA VACCINE  07/09/2021  . TETANUS/TDAP  09/14/2021  . HEMOGLOBIN A1C  10/13/2021  . FOOT EXAM  04/12/2022  . PNA vac Low Risk Adult (2 of 2 - PPSV23) 07/07/2023  . COLONOSCOPY (Pts 45-32yrs Insurance coverage will need to be confirmed)  06/09/2025  . COVID-19 Vaccine  Completed  . Hepatitis C Screening  Completed  . HPV VACCINES  Aged Out   Cancer Screenings: Lung: Low Dose CT Chest recommended if Age 64-80 years, 30 pack-year currently smoking OR have quit w/in 15years. Patient does not qualify. Colorectal: UTD  Additional Screenings: Hepatitis C Screening: Completed  HIV Screening: Completed   Plan:  Next diabetes apt due 07/13/2021. Please call in June to make apt.  Complete an advance directive.  Eye apt scheduled for next month.   I have personally reviewed and noted the following in the patient's chart:   . Medical and social history . Use of alcohol, tobacco or illicit drugs  . Current medications and supplements . Functional ability and status . Nutritional status . Physical activity . Advanced directives . List of other physicians . Hospitalizations, surgeries, and ER visits in previous 12 months . Vitals . Screenings to include cognitive, depression, and falls . Referrals and appointments  In addition, I have reviewed and discussed with patient  certain preventive protocols, quality metrics, and best practice recommendations. A written personalized care plan for preventive services as well as general preventive health recommendations were  provided to patient.  This visit was conducted virtually in the setting of the [place] pandemic.   Dorna Bloom, [place]  04/18/2021

## 2021-04-18 NOTE — Patient Instructions (Signed)
You spoke to Dorna Bloom, [place] over the phone for your annual wellness visit.  We discussed goals: Goals    . HEMOGLOBIN A1C < 7     Patient continues to strive for an A1C<7. He was doing well until last check on 04/12/2021 of 8.3. Patient to cut back on sugary drinks.       We also discussed recommended health maintenance.  As discussed, you are up to date with everything!   Health Maintenance  Topic Date Due  . OPHTHALMOLOGY EXAM  05/24/2021  . INFLUENZA VACCINE  07/09/2021  . TETANUS/TDAP  09/14/2021  . HEMOGLOBIN A1C  10/13/2021  . FOOT EXAM  04/12/2022  . PNA vac Low Risk Adult (2 of 2 - PPSV23) 07/07/2023  . COLONOSCOPY (Pts 45-72yrs Insurance coverage will need to be confirmed)  06/09/2025  . COVID-19 Vaccine  Completed  . Hepatitis C Screening  Completed  . HPV VACCINES  Aged Out   Next diabetes apt due 07/13/2021. Please call in June to make apt.  Complete an advance directive.  Eye apt scheduled for next month.   Preventive Care 67 Years and Older, Male Preventive care refers to lifestyle choices and visits with your health care provider that can promote health and wellness. This includes:  A yearly physical exam. This is also called an annual wellness visit.  Regular dental and eye exams.  Immunizations.  Screening for certain conditions.  Healthy lifestyle choices, such as: ? Eating a healthy diet. ? Getting regular exercise. ? Not using drugs or products that contain nicotine and tobacco. ? Limiting alcohol use. What can I expect for my preventive care visit? Physical exam Your health care provider will check your:  Height and weight. These may be used to calculate your BMI (body mass index). BMI is a measurement that tells if you are at a healthy weight.  Heart rate and blood pressure.  Body temperature.  Skin for abnormal spots. Counseling Your health care provider may ask you questions about your:  Past medical problems.  Family's medical  history.  Alcohol, tobacco, and drug use.  Emotional well-being.  Home life and relationship well-being.  Sexual activity.  Diet, exercise, and sleep habits.  History of falls.  Memory and ability to understand (cognition).  Work and work Statistician.  Access to firearms. What immunizations do I need? Vaccines are usually given at various ages, according to a schedule. Your health care provider will recommend vaccines for you based on your age, medical history, and lifestyle or other factors, such as travel or where you work.   What tests do I need? Blood tests  Lipid and cholesterol levels. These may be checked every 5 years, or more often depending on your overall health.  Hepatitis C test.  Hepatitis B test. Screening  Lung cancer screening. You may have this screening every year starting at age 21 if you have a 30-pack-year history of smoking and currently smoke or have quit within the past 15 years.  Colorectal cancer screening. ? All adults should have this screening starting at age 69 and continuing until age 28. ? Your health care provider may recommend screening at age 85 if you are at increased risk. ? You will have tests every 1-10 years, depending on your results and the type of screening test.  Prostate cancer screening. Recommendations will vary depending on your family history and other risks.  Genital exam to check for testicular cancer or hernias.  Diabetes screening. ? This is  done by checking your blood sugar (glucose) after you have not eaten for a while (fasting). ? You may have this done every 1-3 years.  Abdominal aortic aneurysm (AAA) screening. You may need this if you are a current or former smoker.  STD (sexually transmitted disease) testing, if you are at risk. Follow these instructions at home: Eating and drinking  Eat a diet that includes fresh fruits and vegetables, whole grains, lean protein, and low-fat dairy products. Limit your  intake of foods with high amounts of sugar, saturated fats, and salt.  Take vitamin and mineral supplements as recommended by your health care provider.  Do not drink alcohol if your health care provider tells you not to drink.  If you drink alcohol: ? Limit how much you have to 0-2 drinks a day. ? Be aware of how much alcohol is in your drink. In the [place] one drink equals one 12 oz bottle of beer (355 mL), one 5 oz glass of wine (148 mL), or one 1 oz glass of hard liquor (44 mL).   Lifestyle  Take daily care of your teeth and gums. Brush your teeth every morning and night with fluoride toothpaste. Floss one time each day.  Stay active. Exercise for at least 30 minutes 5 or more days each week.  Do not use any products that contain nicotine or tobacco, such as cigarettes, e-cigarettes, and chewing tobacco. If you need help quitting, ask your health care provider.  Do not use drugs.  If you are sexually active, practice safe sex. Use a condom or other form of protection to prevent STIs (sexually transmitted infections).  Talk with your health care provider about taking a low-dose aspirin or statin.  Find healthy ways to cope with stress, such as: ? Meditation, yoga, or listening to music. ? Journaling. ? Talking to a trusted person. ? Spending time with friends and family. Safety  Always wear your seat belt while driving or riding in a vehicle.  Do not drive: ? If you have been drinking alcohol. Do not ride with someone who has been drinking. ? When you are tired or distracted. ? While texting.  Wear a helmet and other protective equipment during sports activities.  If you have firearms in your house, make sure you follow all gun safety procedures. What's next?  Visit your health care provider once a year for an annual wellness visit.  Ask your health care provider how often you should have your eyes and teeth checked.  Stay up to date on all vaccines. This information  is not intended to replace advice given to you by your health care provider. Make sure you discuss any questions you have with your health care provider. Document Revised: 08/24/2019 Document Reviewed: 11/19/2018 Elsevier Patient Education  [place].   Diet Recommendations for Diabetes   1. Eat at least 3 meals and 1-2 snacks per day. Never go more than 4-5 hours while awake without eating. Eat breakfast within the first hour of getting up.   2. Limit starchy foods to TWO per meal and ONE per snack. ONE portion of a starchy  food is equal to the following:   - ONE slice of bread (or its equivalent, such as half of a hamburger bun).   - 1/2 cup of a "scoopable" starchy food such as potatoes or rice.   - 15 grams of Total Carbohydrate as shown on food label.  3. Include at every meal: a protein food, a carb  food, and vegetables and/or fruit.   - Obtain twice the volume of vegetables as protein or carbohydrate foods for both lunch and dinner.   - Fresh or frozen vegetables are best.   - Keep frozen vegetables on hand for a quick vegetable serving.       Starchy (carb) foods: Bread, rice, pasta, potatoes, corn, cereal, grits, crackers, bagels, muffins, all baked goods.  (Fruits, milk, and yogurt also have carbohydrate, but most of these foods will not spike your blood sugar as most starchy foods will.)  A few fruits do cause high blood sugars; use small portions of bananas (limit to 1/2 at a time), grapes, watermelon, oranges, and most tropical fruits.    Protein foods: Meat, fish, poultry, eggs, dairy foods, and beans such as pinto and kidney beans (beans also provide carbohydrate).     Our clinic's number is [phone]. Please call with questions or concerns about what we discussed today.

## 2021-04-23 NOTE — Progress Notes (Signed)
I have reviewed this visit and agree with the documentation.   

## 2021-04-25 ENCOUNTER — Encounter: Payer: Self-pay | Admitting: Family Medicine

## 2021-04-29 ENCOUNTER — Other Ambulatory Visit: Payer: Self-pay | Admitting: Family Medicine

## 2021-04-30 DIAGNOSIS — R319 Hematuria, unspecified: Secondary | ICD-10-CM | POA: Diagnosis not present

## 2021-04-30 DIAGNOSIS — R911 Solitary pulmonary nodule: Secondary | ICD-10-CM | POA: Diagnosis not present

## 2021-04-30 DIAGNOSIS — Z0000 Encounter for general adult medical examination without abnormal findings: Secondary | ICD-10-CM | POA: Diagnosis not present

## 2021-04-30 DIAGNOSIS — C9000 Multiple myeloma not having achieved remission: Secondary | ICD-10-CM | POA: Diagnosis not present

## 2021-04-30 DIAGNOSIS — N401 Enlarged prostate with lower urinary tract symptoms: Secondary | ICD-10-CM | POA: Diagnosis not present

## 2021-04-30 DIAGNOSIS — N189 Chronic kidney disease, unspecified: Secondary | ICD-10-CM | POA: Diagnosis not present

## 2021-04-30 DIAGNOSIS — R897 Abnormal histological findings in specimens from other organs, systems and tissues: Secondary | ICD-10-CM | POA: Diagnosis not present

## 2021-05-17 DIAGNOSIS — N401 Enlarged prostate with lower urinary tract symptoms: Secondary | ICD-10-CM | POA: Diagnosis not present

## 2021-05-17 DIAGNOSIS — R310 Gross hematuria: Secondary | ICD-10-CM | POA: Diagnosis not present

## 2021-05-17 DIAGNOSIS — R350 Frequency of micturition: Secondary | ICD-10-CM | POA: Diagnosis not present

## 2021-05-17 DIAGNOSIS — R972 Elevated prostate specific antigen [PSA]: Secondary | ICD-10-CM | POA: Diagnosis not present

## 2021-05-18 ENCOUNTER — Telehealth: Payer: Self-pay | Admitting: Family Medicine

## 2021-05-18 DIAGNOSIS — E875 Hyperkalemia: Secondary | ICD-10-CM

## 2021-05-18 NOTE — Telephone Encounter (Signed)
I received a copy of patient's most recent visit with his hematologist. They wanted him to have his potassium rechecked within 1 week, but we have not heard from the patient. Red team, can you call patient and ask him to schedule a lab visit to get his potassium rechecked?  I will place an order. Thanks Leeanne Rio, MD

## 2021-05-18 NOTE — Telephone Encounter (Signed)
Left message for patient to call office and make an appointment for LAB visit.  Richard Gill, [place]

## 2021-05-21 ENCOUNTER — Other Ambulatory Visit: Payer: Medicare HMO

## 2021-05-21 ENCOUNTER — Other Ambulatory Visit: Payer: Self-pay

## 2021-05-21 DIAGNOSIS — E875 Hyperkalemia: Secondary | ICD-10-CM

## 2021-05-22 ENCOUNTER — Other Ambulatory Visit: Payer: Self-pay | Admitting: Urology

## 2021-05-22 DIAGNOSIS — R310 Gross hematuria: Secondary | ICD-10-CM

## 2021-05-22 LAB — RENAL FUNCTION PANEL
Albumin: 4.5 g/dL (ref 3.8–4.8)
BUN/Creatinine Ratio: 11 (ref 10–24)
BUN: 28 mg/dL — ABNORMAL HIGH (ref 8–27)
CO2: 16 mmol/L — ABNORMAL LOW (ref 20–29)
Calcium: 8.7 mg/dL (ref 8.6–10.2)
Chloride: 111 mmol/L — ABNORMAL HIGH (ref 96–106)
Creatinine, Ser: 2.63 mg/dL — ABNORMAL HIGH (ref 0.76–1.27)
Glucose: 103 mg/dL — ABNORMAL HIGH (ref 65–99)
Phosphorus: 3.4 mg/dL (ref 2.8–4.1)
Potassium: 5.4 mmol/L — ABNORMAL HIGH (ref 3.5–5.2)
Sodium: 140 mmol/L (ref 134–144)
eGFR: 26 mL/min/{1.73_m2} — ABNORMAL LOW (ref >59)

## 2021-05-23 ENCOUNTER — Telehealth: Payer: Self-pay | Admitting: Family Medicine

## 2021-05-23 DIAGNOSIS — Z794 Long term (current) use of insulin: Secondary | ICD-10-CM

## 2021-05-23 DIAGNOSIS — E119 Type 2 diabetes mellitus without complications: Secondary | ICD-10-CM

## 2021-05-23 NOTE — Telephone Encounter (Signed)
Started on Jardiance on 5/10.  Assume elevation of Crt due to this.    K is 5.4   Was 5.3 in January   Called his cell.  Wife answered Will call back in 30-60 minutes   Called back still not available  Will call tomorrow

## 2021-05-24 ENCOUNTER — Other Ambulatory Visit: Payer: Self-pay | Admitting: Family Medicine

## 2021-05-24 NOTE — Telephone Encounter (Signed)
Spoke with him He is feeling well  Did not take Jardiance today Not taking any K supplements although was taking some kidney supplement last week but stopped Not taking any NSAIDs  Recommend he stop Jardiance and come in for blood test on Monday  He has a prostate bx on Wednesday

## 2021-05-29 ENCOUNTER — Other Ambulatory Visit: Payer: Self-pay

## 2021-05-29 ENCOUNTER — Other Ambulatory Visit: Payer: Medicare HMO

## 2021-05-29 DIAGNOSIS — E119 Type 2 diabetes mellitus without complications: Secondary | ICD-10-CM

## 2021-05-29 DIAGNOSIS — Z794 Long term (current) use of insulin: Secondary | ICD-10-CM | POA: Diagnosis not present

## 2021-05-30 ENCOUNTER — Telehealth: Payer: Self-pay | Admitting: Family Medicine

## 2021-05-30 DIAGNOSIS — E875 Hyperkalemia: Secondary | ICD-10-CM

## 2021-05-30 DIAGNOSIS — R972 Elevated prostate specific antigen [PSA]: Secondary | ICD-10-CM | POA: Diagnosis not present

## 2021-05-30 LAB — BASIC METABOLIC PANEL
BUN/Creatinine Ratio: 17 (ref 10–24)
BUN: 45 mg/dL — ABNORMAL HIGH (ref 8–27)
CO2: 16 mmol/L — ABNORMAL LOW (ref 20–29)
Calcium: 8.8 mg/dL (ref 8.6–10.2)
Chloride: 114 mmol/L — ABNORMAL HIGH (ref 96–106)
Creatinine, Ser: 2.64 mg/dL — ABNORMAL HIGH (ref 0.76–1.27)
Glucose: 120 mg/dL — ABNORMAL HIGH (ref 65–99)
Potassium: 5.5 mmol/L — ABNORMAL HIGH (ref 3.5–5.2)
Sodium: 142 mmol/L (ref 134–144)
eGFR: 26 mL/min/{1.73_m2} — ABNORMAL LOW (ref >59)

## 2021-05-30 NOTE — Telephone Encounter (Signed)
Called and spoke with patient Richard Gill still elevated He will stop his lisinopril 5mg  daily Advised to drink plenty of fluids Scheduled for recheck Friday AM here at the [facility].  Patient appreciative.  Leeanne Rio, MD

## 2021-06-01 ENCOUNTER — Other Ambulatory Visit: Payer: Medicare HMO

## 2021-06-01 ENCOUNTER — Other Ambulatory Visit: Payer: Self-pay

## 2021-06-01 DIAGNOSIS — E875 Hyperkalemia: Secondary | ICD-10-CM

## 2021-06-02 LAB — RENAL FUNCTION PANEL
Albumin: 4.3 g/dL (ref 3.8–4.8)
BUN/Creatinine Ratio: 16 (ref 10–24)
BUN: 39 mg/dL — ABNORMAL HIGH (ref 8–27)
CO2: 16 mmol/L — ABNORMAL LOW (ref 20–29)
Calcium: 8.8 mg/dL (ref 8.6–10.2)
Chloride: 107 mmol/L — ABNORMAL HIGH (ref 96–106)
Creatinine, Ser: 2.5 mg/dL — ABNORMAL HIGH (ref 0.76–1.27)
Glucose: 220 mg/dL — ABNORMAL HIGH (ref 65–99)
Phosphorus: 3.5 mg/dL (ref 2.8–4.1)
Potassium: 5.2 mmol/L (ref 3.5–5.2)
Sodium: 136 mmol/L (ref 134–144)
eGFR: 28 mL/min/{1.73_m2} — ABNORMAL LOW (ref >59)

## 2021-06-05 ENCOUNTER — Encounter: Payer: Self-pay | Admitting: Family Medicine

## 2021-06-05 ENCOUNTER — Telehealth: Payer: Self-pay | Admitting: Family Medicine

## 2021-06-05 DIAGNOSIS — H34219 Partial retinal artery occlusion, unspecified eye: Secondary | ICD-10-CM | POA: Insufficient documentation

## 2021-06-05 DIAGNOSIS — H52203 Unspecified astigmatism, bilateral: Secondary | ICD-10-CM | POA: Diagnosis not present

## 2021-06-05 DIAGNOSIS — E113293 Type 2 diabetes mellitus with mild nonproliferative diabetic retinopathy without macular edema, bilateral: Secondary | ICD-10-CM | POA: Diagnosis not present

## 2021-06-05 DIAGNOSIS — H349 Unspecified retinal vascular occlusion: Secondary | ICD-10-CM | POA: Diagnosis not present

## 2021-06-05 DIAGNOSIS — H2513 Age-related nuclear cataract, bilateral: Secondary | ICD-10-CM | POA: Diagnosis not present

## 2021-06-05 LAB — HM DIABETES EYE EXAM

## 2021-06-05 NOTE — Telephone Encounter (Signed)
Received call from Dr. Luberta Mutter regarding patient's annual eye exam.  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  She plans to proceed with workup including echo and carotid doppler and wanted to keep me in the loop.  Appreciate Dr. Benna Dunks excellent care.  Leeanne Rio, MD

## 2021-06-07 ENCOUNTER — Other Ambulatory Visit (HOSPITAL_COMMUNITY): Payer: Self-pay | Admitting: Ophthalmology

## 2021-06-07 DIAGNOSIS — H34239 Retinal artery branch occlusion, unspecified eye: Secondary | ICD-10-CM

## 2021-06-08 ENCOUNTER — Other Ambulatory Visit (HOSPITAL_COMMUNITY): Payer: Self-pay | Admitting: Ophthalmology

## 2021-06-08 DIAGNOSIS — H34219 Partial retinal artery occlusion, unspecified eye: Secondary | ICD-10-CM

## 2021-06-12 ENCOUNTER — Encounter: Payer: Self-pay | Admitting: Family Medicine

## 2021-06-13 ENCOUNTER — Ambulatory Visit (HOSPITAL_COMMUNITY)
Admission: RE | Admit: 2021-06-13 | Discharge: 2021-06-13 | Disposition: A | Discharge status: Home / Self Care | Payer: Medicare HMO | Source: Ambulatory Visit | Attending: Internal Medicine | Admitting: Internal Medicine

## 2021-06-13 ENCOUNTER — Other Ambulatory Visit: Payer: Self-pay

## 2021-06-13 DIAGNOSIS — H34212 Partial retinal artery occlusion, left eye: Secondary | ICD-10-CM | POA: Diagnosis not present

## 2021-06-13 DIAGNOSIS — H34219 Partial retinal artery occlusion, unspecified eye: Secondary | ICD-10-CM | POA: Diagnosis not present

## 2021-06-16 ENCOUNTER — Inpatient Hospital Stay: Admission: RE | Admit: 2021-06-16 | Payer: Medicare HMO | Source: Ambulatory Visit

## 2021-06-19 ENCOUNTER — Ambulatory Visit (INDEPENDENT_AMBULATORY_CARE_PROVIDER_SITE_OTHER): Payer: Medicare HMO | Admitting: Family Medicine

## 2021-06-19 ENCOUNTER — Other Ambulatory Visit: Payer: Self-pay

## 2021-06-19 ENCOUNTER — Other Ambulatory Visit: Payer: Self-pay | Admitting: Urology

## 2021-06-19 ENCOUNTER — Encounter: Payer: Self-pay | Admitting: Family Medicine

## 2021-06-19 VITALS — BP 132/72 | HR 57 | Wt 162.0 lb

## 2021-06-19 DIAGNOSIS — R350 Frequency of micturition: Secondary | ICD-10-CM | POA: Diagnosis not present

## 2021-06-19 DIAGNOSIS — Z794 Long term (current) use of insulin: Secondary | ICD-10-CM

## 2021-06-19 DIAGNOSIS — R3912 Poor urinary stream: Secondary | ICD-10-CM | POA: Diagnosis not present

## 2021-06-19 DIAGNOSIS — E119 Type 2 diabetes mellitus without complications: Secondary | ICD-10-CM

## 2021-06-19 DIAGNOSIS — R310 Gross hematuria: Secondary | ICD-10-CM

## 2021-06-19 DIAGNOSIS — N401 Enlarged prostate with lower urinary tract symptoms: Secondary | ICD-10-CM | POA: Diagnosis not present

## 2021-06-19 MED ORDER — SHINGRIX 50 MCG/0.5ML IM SUSR: 0.5000 mL | Freq: Once | INTRAMUSCULAR | 1 refills | Status: AC

## 2021-06-19 NOTE — Patient Instructions (Addendum)
It was great to see you again today!  Stay on current medications. Please eat consistently throughout the day to prevent your blood sugar from going too low  Checking urine for protein today  Take shingles vaccine prescription to your pharmacy  Follow up with me in 1 month for your next A1c  Be well, Dr. Ardelia Mems

## 2021-06-19 NOTE — Progress Notes (Signed)
  Date of Visit: 06/19/2021   SUBJECTIVE:   HPI:  Richard Gill presents today for follow up.  Diabetes- taking basaglar 25u daily. Sugars running 80s-100s fasting. Has had lows later in the day, lowest about 58, is symptomatic at that time. Usually occurs when he is working hard and neglects to eat. Has gotten better recently about eating regularly. Recently had jardiance and lisinopril (on for renal protection) stopped due to hyperkalemia.  Also of note recently saw ophthalmology and noted to have a retinal artery occlusion so underwent carotid dopplers and has upcoming echo scheduled.  OBJECTIVE:   BP 132/72   Pulse (!) 57   Wt 162 lb (73.5 kg)   SpO2 100%   BMI 22.59 kg/m  Gen: no acute distress, pleasant, cooperative HEENT: normocephalic, atraumatic  Lungs: normal work of breathing  Neuro: alert, speech normal, grossly nonfocal  ASSESSMENT/PLAN:   Health maintenance:  -given rx for shingrix to get at his pharmacy -update urine microalbumin:creat ratio today  T2DM (type 2 diabetes mellitus) ([place]) Discussed importance of regular eating to prevent hypoglycemia Continue off jardiance, could cautiously resume at future date and monitor K, but will hold off for now Follow up in 1 month for next A1c  Continue to hold lisinopril due to hyperkalemia  FOLLOW UP: Follow up in 1 month  for A1c  Richard Gill, [place]

## 2021-06-20 LAB — MICROALBUMIN / CREATININE URINE RATIO
Creatinine, Urine: 43.6 mg/dL
Microalb/Creat Ratio: 742 mg/g creat — ABNORMAL HIGH (ref 0–29)
Microalbumin, Urine: 323.6 ug/mL

## 2021-06-22 ENCOUNTER — Other Ambulatory Visit: Payer: Self-pay

## 2021-06-22 ENCOUNTER — Ambulatory Visit (HOSPITAL_COMMUNITY): Payer: Medicare HMO | Attending: Cardiovascular Disease

## 2021-06-22 DIAGNOSIS — I351 Nonrheumatic aortic (valve) insufficiency: Secondary | ICD-10-CM | POA: Diagnosis not present

## 2021-06-22 DIAGNOSIS — E119 Type 2 diabetes mellitus without complications: Secondary | ICD-10-CM | POA: Diagnosis not present

## 2021-06-22 DIAGNOSIS — H34212 Partial retinal artery occlusion, left eye: Secondary | ICD-10-CM | POA: Diagnosis not present

## 2021-06-22 DIAGNOSIS — H34239 Retinal artery branch occlusion, unspecified eye: Secondary | ICD-10-CM | POA: Diagnosis not present

## 2021-06-22 LAB — ECHOCARDIOGRAM COMPLETE
Area-P 1/2: 3.08 cm2
S' Lateral: 3.2 cm

## 2021-06-25 NOTE — Assessment & Plan Note (Signed)
Discussed importance of regular eating to prevent hypoglycemia Continue off jardiance, could cautiously resume at future date and monitor K, but will hold off for now Follow up in 1 month for next A1c

## 2021-06-29 ENCOUNTER — Encounter: Payer: Self-pay | Admitting: Family Medicine

## 2021-06-29 DIAGNOSIS — D472 Monoclonal gammopathy: Secondary | ICD-10-CM | POA: Diagnosis not present

## 2021-06-29 DIAGNOSIS — I129 Hypertensive chronic kidney disease with stage 1 through stage 4 chronic kidney disease, or unspecified chronic kidney disease: Secondary | ICD-10-CM | POA: Diagnosis not present

## 2021-06-29 DIAGNOSIS — N2581 Secondary hyperparathyroidism of renal origin: Secondary | ICD-10-CM | POA: Diagnosis not present

## 2021-06-29 DIAGNOSIS — N183 Chronic kidney disease, stage 3 unspecified: Secondary | ICD-10-CM | POA: Diagnosis not present

## 2021-06-29 DIAGNOSIS — E1122 Type 2 diabetes mellitus with diabetic chronic kidney disease: Secondary | ICD-10-CM | POA: Diagnosis not present

## 2021-06-29 DIAGNOSIS — N400 Enlarged prostate without lower urinary tract symptoms: Secondary | ICD-10-CM | POA: Diagnosis not present

## 2021-07-06 ENCOUNTER — Inpatient Hospital Stay: Admission: RE | Admit: 2021-07-06 | Payer: Medicare HMO | Source: Ambulatory Visit

## 2021-07-06 ENCOUNTER — Other Ambulatory Visit: Payer: Medicare HMO

## 2021-07-16 ENCOUNTER — Other Ambulatory Visit: Payer: Self-pay | Admitting: Family Medicine

## 2021-07-21 ENCOUNTER — Other Ambulatory Visit: Payer: Self-pay

## 2021-07-21 ENCOUNTER — Ambulatory Visit
Admission: RE | Admit: 2021-07-21 | Discharge: 2021-07-21 | Disposition: A | Discharge status: Home / Self Care | Payer: Medicare HMO | Source: Ambulatory Visit | Attending: Urology | Admitting: Urology

## 2021-07-21 DIAGNOSIS — N400 Enlarged prostate without lower urinary tract symptoms: Secondary | ICD-10-CM | POA: Diagnosis not present

## 2021-07-21 DIAGNOSIS — R310 Gross hematuria: Secondary | ICD-10-CM

## 2021-07-21 DIAGNOSIS — N281 Cyst of kidney, acquired: Secondary | ICD-10-CM | POA: Diagnosis not present

## 2021-07-21 MED ORDER — GADOBENATE DIMEGLUMINE 529 MG/ML IV SOLN
15.0000 mL | Freq: Once | INTRAVENOUS | Status: AC | PRN
  Administered 2021-07-21: 15 mL via INTRAVENOUS

## 2021-07-25 ENCOUNTER — Other Ambulatory Visit: Payer: Self-pay

## 2021-07-25 ENCOUNTER — Encounter: Payer: Self-pay | Admitting: Family Medicine

## 2021-07-25 ENCOUNTER — Emergency Department (HOSPITAL_COMMUNITY)
Admission: EM | Admit: 2021-07-25 | Discharge: 2021-07-26 | Disposition: A | Discharge status: Home / Self Care | Payer: Medicare HMO | Attending: Emergency Medicine | Admitting: Emergency Medicine

## 2021-07-25 ENCOUNTER — Encounter (HOSPITAL_COMMUNITY): Payer: Self-pay | Admitting: Emergency Medicine

## 2021-07-25 DIAGNOSIS — N182 Chronic kidney disease, stage 2 (mild): Secondary | ICD-10-CM | POA: Insufficient documentation

## 2021-07-25 DIAGNOSIS — Z7982 Long term (current) use of aspirin: Secondary | ICD-10-CM | POA: Diagnosis not present

## 2021-07-25 DIAGNOSIS — Z794 Long term (current) use of insulin: Secondary | ICD-10-CM | POA: Diagnosis not present

## 2021-07-25 DIAGNOSIS — N139 Obstructive and reflux uropathy, unspecified: Secondary | ICD-10-CM | POA: Diagnosis not present

## 2021-07-25 DIAGNOSIS — Z87891 Personal history of nicotine dependence: Secondary | ICD-10-CM | POA: Diagnosis not present

## 2021-07-25 DIAGNOSIS — R310 Gross hematuria: Secondary | ICD-10-CM

## 2021-07-25 DIAGNOSIS — E1122 Type 2 diabetes mellitus with diabetic chronic kidney disease: Secondary | ICD-10-CM | POA: Insufficient documentation

## 2021-07-25 DIAGNOSIS — R319 Hematuria, unspecified: Secondary | ICD-10-CM | POA: Diagnosis not present

## 2021-07-25 LAB — BASIC METABOLIC PANEL
Anion gap: 9 (ref 5–15)
BUN: 35 mg/dL — ABNORMAL HIGH (ref 8–23)
CO2: 23 mmol/L (ref 22–32)
Calcium: 8.8 mg/dL — ABNORMAL LOW (ref 8.9–10.3)
Chloride: 110 mmol/L (ref 98–111)
Creatinine, Ser: 2.59 mg/dL — ABNORMAL HIGH (ref 0.61–1.24)
GFR, Estimated: 26 mL/min — ABNORMAL LOW (ref >60)
Glucose, Bld: 91 mg/dL (ref 70–99)
Potassium: 4.4 mmol/L (ref 3.5–5.1)
Sodium: 142 mmol/L (ref 135–145)

## 2021-07-25 LAB — CBC WITH DIFFERENTIAL/PLATELET
Abs Immature Granulocytes: 0.03 10*3/uL (ref 0.00–0.07)
Basophils Absolute: 0 10*3/uL (ref 0.0–0.1)
Basophils Relative: 0 %
Eosinophils Absolute: 0.1 10*3/uL (ref 0.0–0.5)
Eosinophils Relative: 1 %
HCT: 31.8 % — ABNORMAL LOW (ref 39.0–52.0)
Hemoglobin: 10.9 g/dL — ABNORMAL LOW (ref 13.0–17.0)
Immature Granulocytes: 0 %
Lymphocytes Relative: 13 %
Lymphs Abs: 1 10*3/uL (ref 0.7–4.0)
MCH: 30.6 pg (ref 26.0–34.0)
MCHC: 34.3 g/dL (ref 30.0–36.0)
MCV: 89.3 fL (ref 80.0–100.0)
Monocytes Absolute: 0.5 10*3/uL (ref 0.1–1.0)
Monocytes Relative: 6 %
Neutro Abs: 6.3 10*3/uL (ref 1.7–7.7)
Neutrophils Relative %: 80 %
Platelets: 155 10*3/uL (ref 150–400)
RBC: 3.56 MIL/uL — ABNORMAL LOW (ref 4.22–5.81)
RDW: 12.6 % (ref 11.5–15.5)
WBC: 7.9 10*3/uL (ref 4.0–10.5)
nRBC: 0 % (ref 0.0–0.2)

## 2021-07-25 LAB — URINALYSIS, ROUTINE W REFLEX MICROSCOPIC: Bacteria, UA: NONE SEEN

## 2021-07-25 LAB — CBG MONITORING, ED: Glucose-Capillary: 80 mg/dL (ref 70–99)

## 2021-07-25 NOTE — ED Triage Notes (Signed)
Patient reports hematuria x2 weeks worsening over the last few days. Spoke with urology with plan to schedule surgery however bleeding has worsened. Denies pain. Hx TURP.

## 2021-07-25 NOTE — ED Provider Notes (Signed)
Emergency Medicine Provider Triage Evaluation Note  Richard Gill. , a 65 y.o. male  was evaluated in triage.  Pt complains of hematuria.  Patient has had hematuria over the last 2 weeks however has gradually worsened over this time.  Patient reports that today he had episode of nothing but bright red blood and then was unable to urinate after.  Patient reports that he feels like he needs to urinate however has been unable to.  Reports that he sees Dr. Abner Greenspan with [facility].  Review of Systems  Positive: Hematuria Negative: Fever, chills, abdominal pain, flank pain, dysuria, urinary frequency  Physical Exam  BP (!) 164/85 (BP Location: Right Arm)   Pulse 95   Temp 99.3 F (37.4 C) (Oral)   Resp 16   SpO2 97%  Gen:   Awake, no distress   Resp:  Normal effort  MSK:   Moves extremities without difficulty  Other:  Abdomen soft, nondistended, nontender, no guarding or rebound tenderness.  Medical Decision Making  Medically screening exam initiated at 5:57 PM.  Appropriate orders placed.  [name]. was informed that the remainder of the evaluation will be completed by another provider, this initial triage assessment does not replace that evaluation, and the importance of remaining in the ED until their evaluation is complete.  The patient appears stable so that the remainder of the work up may be completed by another provider.      Loni Beckwith, PA-C 07/25/21 1759    Jeanell Sparrow, DO 07/25/21 [id number]

## 2021-07-26 NOTE — ED Provider Notes (Signed)
[facility] DEPT Provider Note: Georgena Spurling, MD, FACEP  CSN: [id number] MRN: [id number] ARRIVAL: 07/25/21 at 18 ROOM: [place]  Hematuria   HISTORY OF PRESENT ILLNESS  07/26/21 1:51 AM Richard Gill. is a 65 y.o. male who has been having hematuria for the past 2 weeks.  He has seen Dr. Abner Greenspan about this and had an MRI on 07/21/2021.  The patient states surgery is pending.  It showed no acute abnormalities and no focal urinary tract lesion to explain the patient's hematuria.  It did show an enlarged prostate despite the patient's TURP and 2014.  It also showed bilateral kidney cysts.  The patient is here due to acute worsening of the hematuria yesterday.  It became grossly bloody rather than blood-tinged and he was passing clots.  He felt like he could not empty his bladder and had severe discomfort in his bladder prior to arrival.  Since arrival in the ED he has been able to urinate (grossly bloody urine) twice in the ED.  He denies any pain or dysuria at this time.   Past Medical History:  Diagnosis Date   BPH (benign prostatic hyperplasia)    Chronic kidney disease, stage II (mild)    Dr. 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    Prostatitis    Sickle cell trait ([place])    T2DM (type 2 diabetes mellitus) ([place]) 2005   lantus x1 year    Past Surgical History:  Procedure Laterality Date   BUNIONECTOMY Bilateral 08-25-13   COLONOSCOPY  2014   Dr. Benson Norway, was told to f/u in 2017-2019   TRANSURETHRAL RESECTION OF PROSTATE N/A 09/06/2013   Procedure: TRANSURETHRAL RESECTION OF THE PROSTATE WITH GYRUS INSTRUMENTS;  Surgeon: [name], MD;  Location: [facility];  Service: Urology;  Laterality: N/A;    Family History  Problem Relation Age of Onset   Heart attack Father 41   Stroke Mother    Colon cancer Sister 75   Colon cancer Sister 49    Social  History   Tobacco Use   Smoking status: Former    Packs/day: 1.00    Years: 10.00    Pack years: 10.00    Types: Cigarettes    Quit date: 01/28/1991    Years since quitting: 30.5   Smokeless tobacco: Never  Vaping Use   Vaping Use: Never used  Substance Use Topics   Alcohol use: No   Drug use: No    Prior to Admission medications   Medication Sig Start Date End Date Taking? Authorizing Provider  aspirin EC 81 MG tablet Take 81 mg by mouth daily. Swallow whole.    [provider]  glucose blood (RELION GLUCOSE TEST STRIPS) test strip Check sugar 3 times daily 01/05/19   Leeanne Rio, MD  hydrOXYzine (ATARAX/VISTARIL) 25 MG tablet TAKE 1 TABLET BY MOUTH ONCE DAILY AS NEEDED 07/16/21   Leeanne Rio, MD  Insulin Glargine [facility]) 100 UNIT/ML Inject 25 Units into the skin daily. 05/24/21   Lind Covert, MD  Lancets (ACCU-CHEK SOFT TOUCH) lancets Use as instructed 09/29/18   Martyn Malay, MD  Misc Natural Products (PROSTATE) CAPS Take 2 capsules by mouth 2 (two) times daily. Prostate Strong    [provider]  RELION PEN NEEDLES 31G X 6 MM MISC USE 1  ONCE DAILY 07/16/21   Ardelia Mems,  Delorse Limber, MD  rosuvastatin (CRESTOR) 40 MG tablet Take 1 tablet by mouth once daily 04/30/21   Leeanne Rio, MD  tamsulosin (FLOMAX) 0.4 MG CAPS capsule Take 2 capsules (0.8 mg total) by mouth daily. Patient taking differently: Take 0.4 mg by mouth daily. 11/06/15   Smiley Houseman, MD    Allergies Smz-tmp ds [sulfamethoxazole-trimethoprim] and Magnesium-containing compounds   REVIEW OF SYSTEMS  Negative except as noted here or in the History of Present Illness.   PHYSICAL EXAMINATION  Initial Vital Signs Blood pressure (!) 143/75, pulse 60, temperature 98.2 F (36.8 C), temperature source Oral, resp. rate 15, SpO2 99 %.  Examination General: Well-developed, well-nourished male in no acute distress; appearance consistent with age of  record HENT: normocephalic; atraumatic Eyes: Normal appearance Neck: supple Heart: regular rate and rhythm Lungs: clear to auscultation bilaterally Abdomen: soft; nondistended; nontender; bowel sounds present; bedside bladder scan shows approximately 118 mL in the bladder Extremities: No deformity; full range of motion; pulses normal Neurologic: Awake, alert and oriented; motor function intact in all extremities and symmetric; no facial droop Skin: Warm and dry Psychiatric: Normal mood and affect   RESULTS  Summary of this visit's results, reviewed and interpreted by myself:   EKG Interpretation  Date/Time:    Ventricular Rate:    PR Interval:    QRS Duration:   QT Interval:    QTC Calculation:   R Axis:     Text Interpretation:         Laboratory Studies: Results for orders placed or performed during the hospital encounter of 07/25/21 (from the past 24 hour(s))  Urinalysis, Routine w reflex microscopic Urine, Clean Catch     Status: Abnormal   Collection Time: 07/25/21  6:24 PM  Result Value Ref Range   Color, Urine RED (A) YELLOW   APPearance TURBID (A) CLEAR   Specific Gravity, Urine  1.005 - 1.030    TEST NOT REPORTED DUE TO COLOR INTERFERENCE OF URINE PIGMENT   pH  5.0 - 8.0    TEST NOT REPORTED DUE TO COLOR INTERFERENCE OF URINE PIGMENT   Glucose, UA (A) NEGATIVE mg/dL    TEST NOT REPORTED DUE TO COLOR INTERFERENCE OF URINE PIGMENT   Hgb urine dipstick (A) NEGATIVE    TEST NOT REPORTED DUE TO COLOR INTERFERENCE OF URINE PIGMENT   Bilirubin Urine (A) NEGATIVE    TEST NOT REPORTED DUE TO COLOR INTERFERENCE OF URINE PIGMENT   Ketones, ur (A) NEGATIVE mg/dL    TEST NOT REPORTED DUE TO COLOR INTERFERENCE OF URINE PIGMENT   Protein, ur (A) NEGATIVE mg/dL    TEST NOT REPORTED DUE TO COLOR INTERFERENCE OF URINE PIGMENT   Nitrite (A) NEGATIVE    TEST NOT REPORTED DUE TO COLOR INTERFERENCE OF URINE PIGMENT   Leukocytes,Ua (A) NEGATIVE    TEST NOT REPORTED DUE TO  COLOR INTERFERENCE OF URINE PIGMENT   Bacteria, UA NONE SEEN NONE SEEN  Basic metabolic panel     Status: Abnormal   Collection Time: 07/25/21  6:32 PM  Result Value Ref Range   Sodium 142 135 - 145 mmol/L   Potassium 4.4 3.5 - 5.1 mmol/L   Chloride 110 98 - 111 mmol/L   CO2 23 22 - 32 mmol/L   Glucose, Bld 91 70 - 99 mg/dL   BUN 35 (H) 8 - 23 mg/dL   Creatinine, Ser 2.59 (H) 0.61 - 1.24 mg/dL   Calcium 8.8 (L) 8.9 - 10.3 mg/dL   GFR,  Estimated 26 (L) >60 mL/min   Anion gap 9 5 - 15  CBC with Differential     Status: Abnormal   Collection Time: 07/25/21  6:32 PM  Result Value Ref Range   WBC 7.9 4.0 - 10.5 K/uL   RBC 3.56 (L) 4.22 - 5.81 MIL/uL   Hemoglobin 10.9 (L) 13.0 - 17.0 g/dL   HCT 31.8 (L) 39.0 - 52.0 %   MCV 89.3 80.0 - 100.0 fL   MCH 30.6 26.0 - 34.0 pg   MCHC 34.3 30.0 - 36.0 g/dL   RDW 12.6 11.5 - 15.5 %   Platelets 155 150 - 400 K/uL   nRBC 0.0 0.0 - 0.2 %   Neutrophils Relative % 80 %   Neutro Abs 6.3 1.7 - 7.7 K/uL   Lymphocytes Relative 13 %   Lymphs Abs 1.0 0.7 - 4.0 K/uL   Monocytes Relative 6 %   Monocytes Absolute 0.5 0.1 - 1.0 K/uL   Eosinophils Relative 1 %   Eosinophils Absolute 0.1 0.0 - 0.5 K/uL   Basophils Relative 0 %   Basophils Absolute 0.0 0.0 - 0.1 K/uL   Immature Granulocytes 0 %   Abs Immature Granulocytes 0.03 0.00 - 0.07 K/uL  CBG monitoring, ED     Status: None   Collection Time: 07/25/21 11:37 PM  Result Value Ref Range   Glucose-Capillary 80 70 - 99 mg/dL   Imaging Studies: No results found.  ED COURSE and MDM  Nursing notes, initial and subsequent vitals signs, including pulse oximetry, reviewed and interpreted by myself.  Vitals:   07/25/21 2050 07/25/21 2342 07/26/21 0100 07/26/21 0203  BP: (!) 173/88 (!) 148/91 (!) 143/75 (!) 150/99  Pulse: 64 66 60 60  Resp: 18 17 15 16   Temp:   98.2 F (36.8 C)   TempSrc:   Oral   SpO2: 100% 100% 99% 100%   Medications - No data to display  2:34 AM Patient still able to void  and in no discomfort.  I do not believe that bladder irrigation or indwelling Foley catheter would be in the patient's best interest at this time.  He has already contacted Dr. Abner Greenspan and expects to talk to his office again later this morning.  He was advised he should return should he feel the inability to void again.  PROCEDURES  Procedures   ED DIAGNOSES     ICD-10-CM   1. Acute urinary obstruction  N13.9     2. Gross hematuria  R31.0          Chen Holzman, Jenny Reichmann, MD 07/26/21 [phone]

## 2021-07-27 ENCOUNTER — Telehealth: Payer: Self-pay | Admitting: Urology

## 2021-07-27 NOTE — Telephone Encounter (Signed)
LMOM for pt to call back to schedule appt per Sninsky's message  schedule with me in the next few weeks to discuss HOLEP and bladder stone removal

## 2021-07-30 NOTE — Telephone Encounter (Signed)
Pt returns call, appt scheduled.

## 2021-08-02 ENCOUNTER — Other Ambulatory Visit: Payer: Self-pay

## 2021-08-02 ENCOUNTER — Ambulatory Visit (INDEPENDENT_AMBULATORY_CARE_PROVIDER_SITE_OTHER): Payer: Medicare HMO | Admitting: Family Medicine

## 2021-08-02 ENCOUNTER — Encounter: Payer: Self-pay | Admitting: Family Medicine

## 2021-08-02 VITALS — BP 133/79 | HR 61 | Wt 164.6 lb

## 2021-08-02 DIAGNOSIS — E785 Hyperlipidemia, unspecified: Secondary | ICD-10-CM | POA: Diagnosis not present

## 2021-08-02 DIAGNOSIS — N182 Chronic kidney disease, stage 2 (mild): Secondary | ICD-10-CM

## 2021-08-02 DIAGNOSIS — Z794 Long term (current) use of insulin: Secondary | ICD-10-CM | POA: Diagnosis not present

## 2021-08-02 DIAGNOSIS — I10 Essential (primary) hypertension: Secondary | ICD-10-CM

## 2021-08-02 DIAGNOSIS — N400 Enlarged prostate without lower urinary tract symptoms: Secondary | ICD-10-CM | POA: Diagnosis not present

## 2021-08-02 DIAGNOSIS — E119 Type 2 diabetes mellitus without complications: Secondary | ICD-10-CM

## 2021-08-02 LAB — POCT GLYCOSYLATED HEMOGLOBIN (HGB A1C): HbA1c, POC (controlled diabetic range): 7.9 % — AB (ref 0.0–7.0)

## 2021-08-02 MED ORDER — EMPAGLIFLOZIN 10 MG PO TABS: 10.0000 mg | ORAL_TABLET | Freq: Every day | ORAL | 3 refills | Status: DC

## 2021-08-02 NOTE — Assessment & Plan Note (Addendum)
HbA1c is 7.9 today, down from 8.3 in May 2022. Glucose levels at home have been up and down (ranging from 70-120s). Discussed finding healthy snacks to eat throughout the day instead of restricting food intake to keep blood glucose levels steady. Patient is making healthy food choices. Restarting 10 mg jardiance to address higher blood glucose levels. Offered nutritionist which patient declined at this time.

## 2021-08-02 NOTE — Patient Instructions (Signed)
It was great to see you again today!  Eat consistently throughout the day Stay on current medications Start back on jardiance 10mg  daily Follow up in 2 weeks for lab visit to check kidney function, potassium, and cholesterol. Please fast before this appointment.  Follow up with me in 3 months, sooner if needed  Be well, Dr. Ardelia Mems

## 2021-08-02 NOTE — Progress Notes (Signed)
    SUBJECTIVE:   CHIEF COMPLAINT / HPI:   Mr. Richard Gill presents today for follow-up for hypertension and diabetes.  Hypertension - He is able to take his blood pressure 2 times a day. Systolic BPs are usually between 125-135. He has no dizziness or headache.   Diabetes - Mr. Jech says he has gotten "off track" with his diet and has started getting snacks late at night. He knows what he needs to do and does not want to see a nutritionist at this time. He feels he has been eating well and has cut down on meet and will make adjustments to his meals to balance carbs. He gets plenty of exercise as part of his job. Current medications include insulin glargine 25u daily  Hyperlipidemia - taking rosuvastatin 40mg  daily, tolerating well  Health Maintenance - He has the prescription for Shingrix, but has not had a chance to go to the pharmacy to get it.  PERTINENT  PMH / PSH:  BPH Hyperkalemia HTN T2DM  OBJECTIVE:   BP 133/79   Pulse 61   Wt 164 lb 9.6 oz (74.7 kg)   SpO2 100%   BMI 22.96 kg/m   GEN: Well appearing, in no acute distress SKIN: warm, dry CV: Normal rate, rhythm. No murmurs or gallops. PULM: Normal work of breathing, clear to auscultation GI: No distention EXTREMITIES: No edema. NEUR: Grossly normal. PSYCH: Mood and affect appropriate.   ASSESSMENT/PLAN:   BPH (benign prostatic hyperplasia) Is getting laser surgery in November. Has no complaints since ED visit and is able to void as normal.  Chronic kidney disease, stage II (mild) Following with nephrologist. Last saw them on 06/29/21 and will follow-up in four months.  T2DM (type 2 diabetes mellitus) (HCC) HbA1c is 7.9 today, down from 8.3 in May 2022. Glucose levels at home have been up and down (ranging from 70-120s). Discussed finding healthy snacks to eat throughout the day instead of restricting food intake to keep blood glucose levels steady. Patient is making healthy food choices. Restarting 10 mg jardiance  to address higher blood glucose levels. Offered nutritionist which patient declined at this time.  HTN (hypertension) Systolic blood pressures ranging from 125-135 at home. Well controlled on current medication regimen. No changes made today.  Hyperlipidemia Return in 2 weeks for lipids and BMET (want to ensure K and Cr okay after restarting jardiance)   Health Maintenance Patient has Shingrix prescription.  [place]    Patient seen along with [employer] student Cristobal Goldmann. I personally evaluated this patient along with the student, and verified all aspects of the history, physical exam, and medical decision making as documented by the student. I agree with the student's documentation and have made all necessary edits.  Chrisandra Netters, MD  [place]

## 2021-08-02 NOTE — Assessment & Plan Note (Signed)
Is getting laser surgery in November. Has no complaints since ED visit and is able to void as normal.

## 2021-08-02 NOTE — Assessment & Plan Note (Signed)
Following with nephrologist. Last saw them on 06/29/21 and will follow-up in four months.

## 2021-08-02 NOTE — Assessment & Plan Note (Signed)
Systolic blood pressures ranging from 125-135 at home. Well controlled on current medication regimen. No changes made today.

## 2021-08-08 NOTE — Assessment & Plan Note (Signed)
Return in 2 weeks for lipids and BMET (want to ensure K and Cr okay after restarting jardiance)

## 2021-08-09 ENCOUNTER — Other Ambulatory Visit: Payer: Self-pay

## 2021-08-09 ENCOUNTER — Other Ambulatory Visit: Payer: Self-pay | Admitting: Urology

## 2021-08-09 ENCOUNTER — Ambulatory Visit (INDEPENDENT_AMBULATORY_CARE_PROVIDER_SITE_OTHER): Payer: Medicare HMO | Admitting: Urology

## 2021-08-09 VITALS — BP 157/76 | HR 58 | Ht 71.0 in | Wt 168.0 lb

## 2021-08-09 DIAGNOSIS — N400 Enlarged prostate without lower urinary tract symptoms: Secondary | ICD-10-CM | POA: Diagnosis not present

## 2021-08-09 DIAGNOSIS — R310 Gross hematuria: Secondary | ICD-10-CM | POA: Diagnosis not present

## 2021-08-09 LAB — BLADDER SCAN AMB NON-IMAGING: Scan Result: 141

## 2021-08-09 NOTE — Progress Notes (Signed)
08/09/21 4:21 PM   Richard Gill. 1955/12/31 [id number]  CC: BPH, gross hematuria, bladder stones  HPI: 65 year old male referred from Dr. Abner Greenspan at [facility] urology for the above issues.  He has a history of a TURP in 2014, but has had recurrent urinary symptoms, elevated PSA, weak urinary stream and nocturia, and recurrent gross hematuria suspected to be prostatic origin.  He reportedly had an elevated PSA and underwent a recent prostate biopsy with Dr. Abner Greenspan that showed no evidence of prostate cancer, and prostate MRI showed a 140 g prostate with two 1 cm bladder stones.  I am able to personally review the MRI, but the clinic notes are unavailable to me.  He was referred for discussion of HOLEP.   PMH: Past Medical History:  Diagnosis Date   BPH (benign prostatic hyperplasia)    Chronic kidney disease, stage II (mild)    Dr. 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    Prostatitis    Sickle cell trait ([place])    T2DM (type 2 diabetes mellitus) ([place]) 2005   lantus x1 year    Surgical History: Past Surgical History:  Procedure Laterality Date   BUNIONECTOMY Bilateral 08-25-13   COLONOSCOPY  2014   Dr. Benson Norway, was told to f/u in 2017-2019   TRANSURETHRAL RESECTION OF PROSTATE N/A 09/06/2013   Procedure: TRANSURETHRAL RESECTION OF THE PROSTATE WITH GYRUS INSTRUMENTS;  Surgeon: [name], MD;  Location: [facility];  Service: Urology;  Laterality: N/A;    Home Medications:  Family History: Family History  Problem Relation Age of Onset   Heart attack Father 22   Stroke Mother    Colon cancer Sister 32   Colon cancer Sister 81    Social History:  reports that he quit smoking about 30 years ago. His smoking use included cigarettes. He has a 10.00 pack-year smoking history. He has never used smokeless tobacco. He reports that he does not drink alcohol and does not use  drugs.  Physical Exam: BP (!) 157/76   Pulse (!) 58   Ht 5\' 11"  (1.803 m)   Wt 168 lb (76.2 kg)   BMI 23.43 kg/m    Constitutional:  Alert and oriented, No acute distress. Cardiovascular: No clubbing, cyanosis, or edema. Respiratory: Normal respiratory effort, no increased work of breathing. GI: Abdomen is soft, nontender, nondistended, no abdominal masses  Laboratory Data: Reviewed, see HPI  Pertinent Imaging: I have personally viewed and interpreted the MRI pelvis showing two 1cm bladder stones and a 140 g prostate.  Assessment & Plan:   65 year old male with history of TURP in 2014 with recurrent BPH symptoms of weak stream, nocturia, incomplete emptying, bladder stones, and recurrent gross hematuria.  He has failed medical management, recent prostate biopsy was negative, prostate measures 140 g on MRI.  We discussed the risks and benefits of HoLEP at length.  The procedure requires general anesthesia and takes 1 to 2 hours, and a holmium laser is used to enucleate the prostate and push this tissue into the bladder.  A morcellator is then used to remove this tissue, which is sent for pathology.  The vast majority(>95%) of patients are able to discharge the same day with a catheter in place for 2 to 3 days, and will follow-up in clinic for a voiding trial.  We specifically discussed the risks of bleeding, infection, retrograde ejaculation, temporary urgency and urge  incontinence, very low risk of long-term incontinence, urethral stricture/bladder neck contracture, pathologic evaluation of prostate tissue and possible detection of prostate cancer or other malignancy, and possible need for additional procedures.  Schedule HOLEP and cystolitholapaxy Follow-up urine culture  Nickolas Madrid, MD 08/09/2021  [facility] [address] [place],  [place] [address] [phone]

## 2021-08-09 NOTE — Patient Instructions (Signed)

## 2021-08-09 NOTE — H&P (View-Only) (Signed)
08/09/21 4:21 PM   [name]. January 29, 1956 [id number]  CC: BPH, gross hematuria, bladder stones  HPI: 65 year old male referred from Dr. Abner Greenspan at [facility] urology for the above issues.  He has a history of a TURP in 2014, but has had recurrent urinary symptoms, elevated PSA, weak urinary stream and nocturia, and recurrent gross hematuria suspected to be prostatic origin.  He reportedly had an elevated PSA and underwent a recent prostate biopsy with Dr. Abner Greenspan that showed no evidence of prostate cancer, and prostate MRI showed a 140 g prostate with two 1 cm bladder stones.  I am able to personally review the MRI, but the clinic notes are unavailable to me.  He was referred for discussion of HOLEP.   PMH: Past Medical History:  Diagnosis Date   BPH (benign prostatic hyperplasia)    Chronic kidney disease, stage II (mild)    Dr. 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    Prostatitis    Sickle cell trait ([place])    T2DM (type 2 diabetes mellitus) ([place]) 2005   lantus x1 year    Surgical History: Past Surgical History:  Procedure Laterality Date   BUNIONECTOMY Bilateral 08-25-13   COLONOSCOPY  2014   Dr. Benson Norway, was told to f/u in 2017-2019   TRANSURETHRAL RESECTION OF PROSTATE N/A 09/06/2013   Procedure: TRANSURETHRAL RESECTION OF THE PROSTATE WITH GYRUS INSTRUMENTS;  Surgeon: [name], MD;  Location: [facility];  Service: Urology;  Laterality: N/A;    Home Medications:  Family History: Family History  Problem Relation Age of Onset   Heart attack Father 62   Stroke Mother    Colon cancer Sister 55   Colon cancer Sister 17    Social History:  reports that he quit smoking about 30 years ago. His smoking use included cigarettes. He has a 10.00 pack-year smoking history. He has never used smokeless tobacco. He reports that he does not drink alcohol and does not use  drugs.  Physical Exam: BP (!) 157/76   Pulse (!) 58   Ht 5\' 11"  (1.803 m)   Wt 168 lb (76.2 kg)   BMI 23.43 kg/m    Constitutional:  Alert and oriented, No acute distress. Cardiovascular: No clubbing, cyanosis, or edema. Respiratory: Normal respiratory effort, no increased work of breathing. GI: Abdomen is soft, nontender, nondistended, no abdominal masses  Laboratory Data: Reviewed, see HPI  Pertinent Imaging: I have personally viewed and interpreted the MRI pelvis showing two 1cm bladder stones and a 140 g prostate.  Assessment & Plan:   65 year old male with history of TURP in 2014 with recurrent BPH symptoms of weak stream, nocturia, incomplete emptying, bladder stones, and recurrent gross hematuria.  He has failed medical management, recent prostate biopsy was negative, prostate measures 140 g on MRI.  We discussed the risks and benefits of HoLEP at length.  The procedure requires general anesthesia and takes 1 to 2 hours, and a holmium laser is used to enucleate the prostate and push this tissue into the bladder.  A morcellator is then used to remove this tissue, which is sent for pathology.  The vast majority(>95%) of patients are able to discharge the same day with a catheter in place for 2 to 3 days, and will follow-up in clinic for a voiding trial.  We specifically discussed the risks of bleeding, infection, retrograde ejaculation, temporary urgency and urge  incontinence, very low risk of long-term incontinence, urethral stricture/bladder neck contracture, pathologic evaluation of prostate tissue and possible detection of prostate cancer or other malignancy, and possible need for additional procedures.  Schedule HOLEP and cystolitholapaxy Follow-up urine culture  Nickolas Madrid, MD 08/09/2021  [facility] [address] [place], [place] [address] [phone]

## 2021-08-10 LAB — MICROSCOPIC EXAMINATION: Bacteria, UA: NONE SEEN

## 2021-08-10 LAB — URINALYSIS, COMPLETE
Bilirubin, UA: NEGATIVE
Ketones, UA: NEGATIVE
Nitrite, UA: NEGATIVE
Specific Gravity, UA: 1.01 (ref 1.005–1.030)
Urobilinogen, Ur: 0.2 mg/dL (ref 0.2–1.0)
pH, UA: 5.5 (ref 5.0–7.5)

## 2021-08-12 LAB — CULTURE, URINE COMPREHENSIVE

## 2021-08-17 ENCOUNTER — Other Ambulatory Visit: Payer: Medicare HMO

## 2021-08-17 ENCOUNTER — Other Ambulatory Visit: Payer: Self-pay

## 2021-08-17 DIAGNOSIS — Z794 Long term (current) use of insulin: Secondary | ICD-10-CM | POA: Diagnosis not present

## 2021-08-17 DIAGNOSIS — E119 Type 2 diabetes mellitus without complications: Secondary | ICD-10-CM | POA: Diagnosis not present

## 2021-08-18 LAB — BASIC METABOLIC PANEL
BUN/Creatinine Ratio: 20 (ref 10–24)
BUN: 49 mg/dL — ABNORMAL HIGH (ref 8–27)
CO2: 17 mmol/L — ABNORMAL LOW (ref 20–29)
Calcium: 9.2 mg/dL (ref 8.6–10.2)
Chloride: 108 mmol/L — ABNORMAL HIGH (ref 96–106)
Creatinine, Ser: 2.49 mg/dL — ABNORMAL HIGH (ref 0.76–1.27)
Glucose: 134 mg/dL — ABNORMAL HIGH (ref 65–99)
Potassium: 4.9 mmol/L (ref 3.5–5.2)
Sodium: 139 mmol/L (ref 134–144)
eGFR: 28 mL/min/{1.73_m2} — ABNORMAL LOW (ref >59)

## 2021-08-18 LAB — LIPID PANEL
Chol/HDL Ratio: 4 ratio (ref 0.0–5.0)
Cholesterol, Total: 153 mg/dL (ref 100–199)
HDL: 38 mg/dL — ABNORMAL LOW (ref >39)
LDL Chol Calc (NIH): 103 mg/dL — ABNORMAL HIGH (ref 0–99)
Triglycerides: 61 mg/dL (ref 0–149)
VLDL Cholesterol Cal: 12 mg/dL (ref 5–40)

## 2021-08-31 ENCOUNTER — Other Ambulatory Visit: Payer: Self-pay

## 2021-08-31 ENCOUNTER — Other Ambulatory Visit
Admission: RE | Admit: 2021-08-31 | Discharge: 2021-08-31 | Disposition: A | Discharge status: Home / Self Care | Payer: Medicare HMO | Source: Ambulatory Visit | Attending: Urology | Admitting: Urology

## 2021-08-31 NOTE — Progress Notes (Signed)
Patient stated he thought he already had an EKG through [facility] Dr. Hardin Negus but the office informed me that he had an Echocardiogram and and ultrasound so I will contact the patient to schedule EKG.

## 2021-08-31 NOTE — Patient Instructions (Addendum)
Your procedure is scheduled DE:YCXKGY 09/07/21 Report to the Registration Desk on the 1st floor of the [facility]. To find out your arrival time, please call [phone] between 1PM - 3PM YO:[id number] 09/06/21  REMEMBER: Instructions that are not followed completely may result in serious medical risk, up to and including death; or upon the discretion of your surgeon and anesthesiologist your surgery may need to be rescheduled.  Do not eat food or drink liquids after midnight the night before surgery.  No gum chewing, lozengers or hard candies.  TAKE THESE MEDICATIONS THE MORNING OF SURGERY WITH A SIP OF WATER: Finasteride Hydroxyzine Flomax Crestor  **Follow new guidelines for insulin and diabetes medications.**  Jardiance- hold for 3 days (take last dose on Monday 09/03/21)  Please call your surgeon's office to find out when you should stop your aspirin.  One week prior to surgery: Stop Anti-inflammatories (NSAIDS) such as Advil, Aleve, Ibuprofen, Motrin, Naproxen, Naprosyn and Aspirin based products such as Excedrin, Goodys Powder, BC Powder. Stop ANY OVER THE COUNTER supplements until after surgery. You may however, continue to take Tylenol if needed for pain up until the day of surgery.  No Alcohol for 24 hours before or after surgery.  No Smoking including e-cigarettes for 24 hours prior to surgery.  No chewable tobacco products for at least 6 hours prior to surgery.  No nicotine patches on the day of surgery.  Do not use any "recreational" drugs for at least a week prior to your surgery.  Please be advised that the combination of cocaine and anesthesia may have negative outcomes, up to and including death. If you test positive for cocaine, your surgery will be cancelled.  On the morning of surgery brush your teeth with toothpaste and water, you may rinse your mouth with mouthwash if you wish. Do not swallow any toothpaste or mouthwash.  Do not wear jewelry, make-up,  hairpins, clips or nail polish.  Do not wear lotions, powders, or perfumes.   Do not shave body from the neck down 48 hours prior to surgery just in case you cut yourself which could leave a site for infection.  Also, freshly shaved skin may become irritated if using the CHG soap.  Do not bring valuables to the hospital. [facility] is not responsible for any missing/lost belongings or valuables.   Notify your doctor if there is any change in your medical condition (cold, fever, infection).  Wear comfortable clothing (specific to your surgery type) to the hospital.  After surgery, you can help prevent lung complications by doing breathing exercises.  Take deep breaths and cough every 1-2 hours.   If you are being discharged the day of surgery, you will not be allowed to drive home. You will need a responsible adult (18 years or older) to drive you home and stay with you that night.   If you are taking public transportation, you will need to have a responsible adult (18 years or older) with you. Please confirm with your physician that it is acceptable to use public transportation.   Please call the [facility] at [phone] if you have any questions about these instructions.  Surgery Visitation Policy:  Patients undergoing a surgery or procedure may have one family member or support person with them as long as that person is not COVID-19 positive or experiencing its symptoms.  That person may remain in the waiting area during the procedure and may rotate out with other people.  Inpatient Visitation:  Visiting hours are 7 a.m. to 8 p.m. Up to two visitors ages 16+ are allowed at one time in a patient room. The visitors may rotate out with other people during the day. Visitors must check out when they leave, or other visitors will not be allowed. One designated support person may remain overnight. The visitor must pass COVID-19 screenings, use hand sanitizer when  entering and exiting the patient's room and wear a mask at all times, including in the patient's room. Patients must also wear a mask when staff or their visitor are in the room. Masking is required regardless of vaccination status.

## 2021-09-07 ENCOUNTER — Ambulatory Visit: Payer: Medicare HMO | Admitting: Certified Registered"

## 2021-09-07 ENCOUNTER — Encounter
Admission: RE | Disposition: A | Discharge status: Home Health | Payer: Self-pay | Source: Home / Self Care | Attending: Urology

## 2021-09-07 ENCOUNTER — Ambulatory Visit: Admit: 2021-09-07 | Payer: Medicare HMO | Admitting: Urology

## 2021-09-07 ENCOUNTER — Ambulatory Visit
Admission: RE | Admit: 2021-09-07 | Discharge: 2021-09-07 | Disposition: A | Discharge status: Home Health | Payer: Medicare HMO | Attending: Urology | Admitting: Urology

## 2021-09-07 ENCOUNTER — Other Ambulatory Visit: Payer: Self-pay | Admitting: Urology

## 2021-09-07 ENCOUNTER — Encounter: Payer: Self-pay | Admitting: Urology

## 2021-09-07 ENCOUNTER — Ambulatory Visit: Payer: Medicare HMO | Admitting: Anesthesiology

## 2021-09-07 DIAGNOSIS — D573 Sickle-cell trait: Secondary | ICD-10-CM | POA: Insufficient documentation

## 2021-09-07 DIAGNOSIS — N138 Other obstructive and reflux uropathy: Secondary | ICD-10-CM | POA: Insufficient documentation

## 2021-09-07 DIAGNOSIS — Z87891 Personal history of nicotine dependence: Secondary | ICD-10-CM | POA: Diagnosis not present

## 2021-09-07 DIAGNOSIS — N403 Nodular prostate with lower urinary tract symptoms: Secondary | ICD-10-CM | POA: Diagnosis not present

## 2021-09-07 DIAGNOSIS — E1122 Type 2 diabetes mellitus with diabetic chronic kidney disease: Secondary | ICD-10-CM | POA: Insufficient documentation

## 2021-09-07 DIAGNOSIS — R310 Gross hematuria: Secondary | ICD-10-CM | POA: Insufficient documentation

## 2021-09-07 DIAGNOSIS — Z794 Long term (current) use of insulin: Secondary | ICD-10-CM | POA: Insufficient documentation

## 2021-09-07 DIAGNOSIS — N182 Chronic kidney disease, stage 2 (mild): Secondary | ICD-10-CM | POA: Insufficient documentation

## 2021-09-07 DIAGNOSIS — E785 Hyperlipidemia, unspecified: Secondary | ICD-10-CM | POA: Diagnosis not present

## 2021-09-07 DIAGNOSIS — N210 Calculus in bladder: Secondary | ICD-10-CM | POA: Insufficient documentation

## 2021-09-07 DIAGNOSIS — N401 Enlarged prostate with lower urinary tract symptoms: Secondary | ICD-10-CM | POA: Insufficient documentation

## 2021-09-07 DIAGNOSIS — N400 Enlarged prostate without lower urinary tract symptoms: Secondary | ICD-10-CM

## 2021-09-07 DIAGNOSIS — N411 Chronic prostatitis: Secondary | ICD-10-CM | POA: Insufficient documentation

## 2021-09-07 HISTORY — PX: HOLEP-LASER ENUCLEATION OF THE PROSTATE WITH MORCELLATION: SHX6641

## 2021-09-07 HISTORY — PX: CYSTOSCOPY WITH FULGERATION: SHX6638

## 2021-09-07 LAB — GLUCOSE, CAPILLARY
Glucose-Capillary: 126 mg/dL — ABNORMAL HIGH (ref 70–99)
Glucose-Capillary: 234 mg/dL — ABNORMAL HIGH (ref 70–99)
Glucose-Capillary: 250 mg/dL — ABNORMAL HIGH (ref 70–99)
Glucose-Capillary: 259 mg/dL — ABNORMAL HIGH (ref 70–99)
Glucose-Capillary: 266 mg/dL — ABNORMAL HIGH (ref 70–99)
Glucose-Capillary: 228 mg/dL — ABNORMAL HIGH (ref 70–99)

## 2021-09-07 SURGERY — CYSTOSCOPY, WITH BLADDER FULGURATION
Anesthesia: General

## 2021-09-07 SURGERY — ENUCLEATION, PROSTATE, USING LASER, WITH MORCELLATION
Anesthesia: General

## 2021-09-07 MED ORDER — CHLORHEXIDINE GLUCONATE 0.12 % MT SOLN
OROMUCOSAL | Status: AC
  Administered 2021-09-07: 15 mL via OROMUCOSAL
  Filled 2021-09-07: qty 15

## 2021-09-07 MED ORDER — CEFAZOLIN SODIUM-DEXTROSE 2-4 GM/100ML-% IV SOLN
INTRAVENOUS | Status: AC
  Filled 2021-09-07: qty 100

## 2021-09-07 MED ORDER — OXYCODONE HCL 5 MG/5ML PO SOLN: 5.0000 mg | Freq: Once | ORAL | Status: DC | PRN

## 2021-09-07 MED ORDER — PROMETHAZINE HCL 25 MG/ML IJ SOLN: 6.2500 mg | INTRAMUSCULAR | Status: DC | PRN

## 2021-09-07 MED ORDER — PROPOFOL 10 MG/ML IV BOLUS
INTRAVENOUS | Status: DC | PRN
Start: 2021-09-07 — End: 2021-09-07
  Administered 2021-09-07: 50 mg via INTRAVENOUS
  Administered 2021-09-07: 200 mg via INTRAVENOUS

## 2021-09-07 MED ORDER — SODIUM CHLORIDE 0.9 % NICU IV INFUSION SIMPLE: 500.0000 mL | INJECTION | Freq: Once | INTRAVENOUS | Status: DC

## 2021-09-07 MED ORDER — OXYCODONE HCL 5 MG PO TABS: 5.0000 mg | ORAL_TABLET | Freq: Once | ORAL | Status: DC | PRN

## 2021-09-07 MED ORDER — ORAL CARE MOUTH RINSE: 15.0000 mL | Freq: Once | OROMUCOSAL | Status: AC

## 2021-09-07 MED ORDER — DEXMEDETOMIDINE HCL IN NACL 400 MCG/100ML IV SOLN
INTRAVENOUS | Status: DC | PRN
  Administered 2021-09-07: 8 ug via INTRAVENOUS
  Administered 2021-09-07: 4 ug via INTRAVENOUS

## 2021-09-07 MED ORDER — FENTANYL CITRATE (PF) 100 MCG/2ML IJ SOLN
25.0000 ug | INTRAMUSCULAR | Status: DC | PRN
  Administered 2021-09-07 (×4): 25 ug via INTRAVENOUS

## 2021-09-07 MED ORDER — EPHEDRINE SULFATE 50 MG/ML IJ SOLN
INTRAMUSCULAR | Status: DC | PRN
  Administered 2021-09-07: 10 mg via INTRAVENOUS
  Administered 2021-09-07: 5 mg via INTRAVENOUS
  Administered 2021-09-07: 10 mg via INTRAVENOUS

## 2021-09-07 MED ORDER — FENTANYL CITRATE (PF) 100 MCG/2ML IJ SOLN: 25.0000 ug | INTRAMUSCULAR | Status: DC | PRN

## 2021-09-07 MED ORDER — INSULIN ASPART 100 UNIT/ML IJ SOLN
INTRAMUSCULAR | Status: AC
  Filled 2021-09-07: qty 1

## 2021-09-07 MED ORDER — ONDANSETRON 4 MG PO TBDP
4.0000 mg | ORAL_TABLET | Freq: Three times a day (TID) | ORAL | 0 refills | Status: DC | PRN
Start: 2021-09-07 — End: 2021-09-19

## 2021-09-07 MED ORDER — DEXAMETHASONE SODIUM PHOSPHATE 10 MG/ML IJ SOLN
INTRAMUSCULAR | Status: AC
  Filled 2021-09-07: qty 1

## 2021-09-07 MED ORDER — INSULIN ASPART 100 UNIT/ML IJ SOLN
10.0000 [IU] | Freq: Once | INTRAMUSCULAR | Status: AC
  Administered 2021-09-07: 10 [IU] via SUBCUTANEOUS

## 2021-09-07 MED ORDER — MEPERIDINE HCL 25 MG/ML IJ SOLN: 6.2500 mg | INTRAMUSCULAR | Status: DC | PRN

## 2021-09-07 MED ORDER — FENTANYL CITRATE (PF) 100 MCG/2ML IJ SOLN
INTRAMUSCULAR | Status: DC | PRN
  Administered 2021-09-07 (×4): 25 ug via INTRAVENOUS

## 2021-09-07 MED ORDER — CHLORHEXIDINE GLUCONATE 0.12 % MT SOLN: 15.0000 mL | Freq: Once | OROMUCOSAL | Status: AC

## 2021-09-07 MED ORDER — SODIUM CHLORIDE 0.9 % IR SOLN
Status: DC | PRN
  Administered 2021-09-07 (×2): 3000 mL
  Administered 2021-09-07: 9000 mL
  Administered 2021-09-07 (×6): 3000 mL
  Administered 2021-09-07 (×2): 6000 mL
  Administered 2021-09-07 (×3): 3000 mL
  Administered 2021-09-07 (×3): 6000 mL
  Administered 2021-09-07: 3000 mL
  Administered 2021-09-07: 6000 mL

## 2021-09-07 MED ORDER — SODIUM CHLORIDE 0.9 % IV SOLN
INTRAVENOUS | Status: DC | PRN
  Administered 2021-09-07: 20 ug/min via INTRAVENOUS

## 2021-09-07 MED ORDER — PROMETHAZINE HCL 25 MG/ML IJ SOLN
INTRAMUSCULAR | Status: AC
  Filled 2021-09-07: qty 1

## 2021-09-07 MED ORDER — LIDOCAINE HCL (CARDIAC) PF 100 MG/5ML IV SOSY
PREFILLED_SYRINGE | INTRAVENOUS | Status: DC | PRN
  Administered 2021-09-07: 100 mg via INTRAVENOUS

## 2021-09-07 MED ORDER — SUGAMMADEX SODIUM 200 MG/2ML IV SOLN
INTRAVENOUS | Status: DC | PRN
  Administered 2021-09-07: 200 mg via INTRAVENOUS

## 2021-09-07 MED ORDER — LACTATED RINGERS IV SOLN: INTRAVENOUS | Status: DC | PRN

## 2021-09-07 MED ORDER — GLYCOPYRROLATE 0.2 MG/ML IJ SOLN
INTRAMUSCULAR | Status: AC
  Filled 2021-09-07: qty 1

## 2021-09-07 MED ORDER — ONDANSETRON HCL 4 MG/2ML IJ SOLN
INTRAMUSCULAR | Status: AC
  Administered 2021-09-07: 4 mg via INTRAVENOUS
  Filled 2021-09-07: qty 2

## 2021-09-07 MED ORDER — PROPOFOL 10 MG/ML IV BOLUS
INTRAVENOUS | Status: DC | PRN
  Administered 2021-09-07: 120 mg via INTRAVENOUS

## 2021-09-07 MED ORDER — FAMOTIDINE 20 MG PO TABS
ORAL_TABLET | ORAL | Status: AC
  Administered 2021-09-07: 20 mg via ORAL
  Filled 2021-09-07: qty 1

## 2021-09-07 MED ORDER — MIDAZOLAM HCL 2 MG/2ML IJ SOLN
INTRAMUSCULAR | Status: AC
  Filled 2021-09-07: qty 2

## 2021-09-07 MED ORDER — ONDANSETRON HCL 4 MG/2ML IJ SOLN
INTRAMUSCULAR | Status: DC | PRN
  Administered 2021-09-07: 4 mg via INTRAVENOUS

## 2021-09-07 MED ORDER — FENTANYL CITRATE (PF) 100 MCG/2ML IJ SOLN
INTRAMUSCULAR | Status: AC
  Administered 2021-09-07: 25 ug via INTRAVENOUS
  Filled 2021-09-07: qty 2

## 2021-09-07 MED ORDER — EPHEDRINE 5 MG/ML INJ
INTRAVENOUS | Status: AC
  Filled 2021-09-07: qty 5

## 2021-09-07 MED ORDER — VASOPRESSIN 20 UNIT/ML IV SOLN
INTRAVENOUS | Status: AC
  Filled 2021-09-07: qty 1

## 2021-09-07 MED ORDER — ONDANSETRON HCL 4 MG/2ML IJ SOLN: 4.0000 mg | Freq: Once | INTRAMUSCULAR | Status: AC

## 2021-09-07 MED ORDER — BELLADONNA ALKALOIDS-OPIUM 16.2-60 MG RE SUPP
RECTAL | Status: DC | PRN
  Administered 2021-09-07: 1 via RECTAL

## 2021-09-07 MED ORDER — SODIUM CHLORIDE 0.9 % IV SOLN: INTRAVENOUS | Status: DC

## 2021-09-07 MED ORDER — INSULIN ASPART 100 UNIT/ML IJ SOLN
5.0000 [IU] | Freq: Once | INTRAMUSCULAR | Status: AC
  Administered 2021-09-07: 5 [IU] via SUBCUTANEOUS

## 2021-09-07 MED ORDER — BELLADONNA ALKALOIDS-OPIUM 16.2-60 MG RE SUPP
RECTAL | Status: AC
  Filled 2021-09-07: qty 1

## 2021-09-07 MED ORDER — VASOPRESSIN 20 UNIT/ML IV SOLN
INTRAVENOUS | Status: DC | PRN
  Administered 2021-09-07: 2 [IU] via INTRAVENOUS
  Administered 2021-09-07: 1 [IU] via INTRAVENOUS

## 2021-09-07 MED ORDER — FENTANYL CITRATE (PF) 100 MCG/2ML IJ SOLN
INTRAMUSCULAR | Status: AC
  Filled 2021-09-07: qty 2

## 2021-09-07 MED ORDER — ONDANSETRON HCL 4 MG/2ML IJ SOLN
INTRAMUSCULAR | Status: AC
  Filled 2021-09-07: qty 2

## 2021-09-07 MED ORDER — SODIUM CHLORIDE 0.9 % IV BOLUS
500.0000 mL | Freq: Once | INTRAVENOUS | Status: AC
  Administered 2021-09-07: 500 mL via INTRAVENOUS

## 2021-09-07 MED ORDER — HYDROCODONE-ACETAMINOPHEN 5-325 MG PO TABS: 1.0000 | ORAL_TABLET | ORAL | 0 refills | Status: AC | PRN

## 2021-09-07 MED ORDER — ROCURONIUM BROMIDE 100 MG/10ML IV SOLN
INTRAVENOUS | Status: DC | PRN
  Administered 2021-09-07: 50 mg via INTRAVENOUS
  Administered 2021-09-07: 30 mg via INTRAVENOUS
  Administered 2021-09-07: 20 mg via INTRAVENOUS

## 2021-09-07 MED ORDER — MIDAZOLAM HCL 2 MG/2ML IJ SOLN
INTRAMUSCULAR | Status: DC | PRN
  Administered 2021-09-07: 2 mg via INTRAVENOUS

## 2021-09-07 MED ORDER — SUGAMMADEX SODIUM 200 MG/2ML IV SOLN: INTRAVENOUS | Status: DC | PRN

## 2021-09-07 MED ORDER — PROPOFOL 10 MG/ML IV BOLUS
INTRAVENOUS | Status: AC
  Filled 2021-09-07: qty 40

## 2021-09-07 MED ORDER — FAMOTIDINE 20 MG PO TABS: 20.0000 mg | ORAL_TABLET | Freq: Once | ORAL | Status: AC

## 2021-09-07 MED ORDER — CEFAZOLIN SODIUM-DEXTROSE 2-4 GM/100ML-% IV SOLN
2.0000 g | INTRAVENOUS | Status: AC
  Administered 2021-09-07: 2 g via INTRAVENOUS

## 2021-09-07 MED ORDER — PHENYLEPHRINE HCL (PRESSORS) 10 MG/ML IV SOLN
INTRAVENOUS | Status: DC | PRN
  Administered 2021-09-07 (×2): 100 ug via INTRAVENOUS

## 2021-09-07 MED ORDER — SODIUM CHLORIDE 0.9 % IR SOLN
Status: DC | PRN
  Administered 2021-09-07: 6000 mL

## 2021-09-07 MED ORDER — SODIUM CHLORIDE 0.9 % IR SOLN
Status: DC | PRN
  Administered 2021-09-07: 3000 mL

## 2021-09-07 MED ORDER — DEXAMETHASONE SODIUM PHOSPHATE 10 MG/ML IJ SOLN
INTRAMUSCULAR | Status: DC | PRN
  Administered 2021-09-07: 4 mg via INTRAVENOUS

## 2021-09-07 MED ORDER — GLYCOPYRROLATE 0.2 MG/ML IJ SOLN
INTRAMUSCULAR | Status: DC | PRN
  Administered 2021-09-07 (×2): .1 mg via INTRAVENOUS

## 2021-09-07 MED ORDER — LIDOCAINE HCL (PF) 2 % IJ SOLN
INTRAMUSCULAR | Status: AC
  Filled 2021-09-07: qty 5

## 2021-09-07 SURGICAL SUPPLY — 24 items
BAG DRAIN CYSTO-URO LG1000N (MISCELLANEOUS) ×2 IMPLANT
BAG DRN RND TRDRP ANRFLXCHMBR (UROLOGICAL SUPPLIES) ×1
BAG URINE DRAIN 2000ML AR STRL (UROLOGICAL SUPPLIES) ×2 IMPLANT
CATH FOL 2WAY LX 18X30 (CATHETERS) ×2 IMPLANT
CATH FOLEY 3WAY 30CC 24FR (CATHETERS) ×2
CATH URTH STD 24FR FL 3W 2 (CATHETERS) ×1 IMPLANT
ELECT REM PT RETURN 9FT ADLT (ELECTROSURGICAL)
ELECTRODE REM PT RTRN 9FT ADLT (ELECTROSURGICAL) IMPLANT
GAUZE 4X4 16PLY ~~LOC~~+RFID DBL (SPONGE) ×4 IMPLANT
GLOVE SURG UNDER POLY LF SZ7.5 (GLOVE) ×2 IMPLANT
GOWN STRL REUS W/ TWL LRG LVL3 (GOWN DISPOSABLE) ×1 IMPLANT
GOWN STRL REUS W/ TWL XL LVL3 (GOWN DISPOSABLE) ×1 IMPLANT
GOWN STRL REUS W/TWL LRG LVL3 (GOWN DISPOSABLE) ×2
GOWN STRL REUS W/TWL XL LVL3 (GOWN DISPOSABLE) ×2
GUIDEWIRE STR DUAL SENSOR (WIRE) IMPLANT
IV NS IRRIG 3000ML ARTHROMATIC (IV SOLUTION) ×8 IMPLANT
MANIFOLD NEPTUNE II (INSTRUMENTS) IMPLANT
PACK CYSTO AR (MISCELLANEOUS) ×2 IMPLANT
SET CYSTO W/LG BORE CLAMP LF (SET/KITS/TRAYS/PACK) ×2 IMPLANT
SYR TOOMEY IRRIG 70ML (MISCELLANEOUS) ×2
SYRINGE TOOMEY IRRIG 70ML (MISCELLANEOUS) ×1 IMPLANT
WATER STERILE IRR 1000ML POUR (IV SOLUTION) ×4 IMPLANT
WATER STERILE IRR 3000ML UROMA (IV SOLUTION) ×8 IMPLANT
WATER STERILE IRR 500ML POUR (IV SOLUTION) IMPLANT

## 2021-09-07 SURGICAL SUPPLY — 45 items
ADAPTER IRRIG TUBE 2 SPIKE SOL (ADAPTER) ×4 IMPLANT
ADPR TBG 2 SPK PMP STRL ASCP (ADAPTER) ×2
BAG DRAIN CYSTO-URO LG1000N (MISCELLANEOUS) ×2 IMPLANT
BAG DRN LRG CPC RND TRDRP CNTR (MISCELLANEOUS) ×1
BAG URO DRAIN 4000ML (MISCELLANEOUS) ×2 IMPLANT
CATH FOLEY 3WAY 30CC 24FR (CATHETERS) ×4
CATH URETL OPEN 5X70 (CATHETERS) ×2 IMPLANT
CATH URTH STD 24FR FL 3W 2 (CATHETERS) ×2 IMPLANT
CNTNR SPEC 2.5X3XGRAD LEK (MISCELLANEOUS)
CONT SPEC 4OZ STER OR WHT (MISCELLANEOUS)
CONT SPEC 4OZ STRL OR WHT (MISCELLANEOUS)
CONTAINER COLLECT MORCELLATR (MISCELLANEOUS) ×1 IMPLANT
CONTAINER SPEC 2.5X3XGRAD LEK (MISCELLANEOUS) IMPLANT
DRAPE UTILITY 15X26 TOWEL STRL (DRAPES) IMPLANT
ELECT BIVAP BIPO 22/24 DONUT (ELECTROSURGICAL) ×2
ELECTRD BIVAP BIPO 22/24 DONUT (ELECTROSURGICAL) ×1 IMPLANT
FIBER LASER FLEXIVA PULSE 550 (Laser) IMPLANT
FIBER LASER MOSES 550 DFL (Laser) ×2 IMPLANT
FILTER OVERFLOW MORCELLATOR (FILTER) ×1 IMPLANT
GAUZE 4X4 16PLY ~~LOC~~+RFID DBL (SPONGE) ×4 IMPLANT
GLOVE SURG UNDER POLY LF SZ7.5 (GLOVE) ×2 IMPLANT
GOWN STRL REUS W/ TWL LRG LVL3 (GOWN DISPOSABLE) ×1 IMPLANT
GOWN STRL REUS W/ TWL XL LVL3 (GOWN DISPOSABLE) ×1 IMPLANT
GOWN STRL REUS W/TWL LRG LVL3 (GOWN DISPOSABLE) ×2
GOWN STRL REUS W/TWL XL LVL3 (GOWN DISPOSABLE) ×2
HOLDER FOLEY CATH W/STRAP (MISCELLANEOUS) ×2 IMPLANT
IV NS IRRIG 3000ML ARTHROMATIC (IV SOLUTION) ×60 IMPLANT
KIT PROBE TRILOGY 3.9X350 (MISCELLANEOUS) IMPLANT
KIT TURNOVER CYSTO (KITS) ×2 IMPLANT
MANIFOLD NEPTUNE II (INSTRUMENTS) IMPLANT
MBRN O SEALING YLW 17 FOR INST (MISCELLANEOUS) ×2
MEMBRANE SLNG YLW 17 FOR INST (MISCELLANEOUS) ×1 IMPLANT
MORCELLATOR COLLECT CONTAINER (MISCELLANEOUS) ×2
MORCELLATOR OVERFLOW FILTER (FILTER) ×2
MORCELLATOR ROTATION 4.75 335 (MISCELLANEOUS) ×4 IMPLANT
PACK CYSTO AR (MISCELLANEOUS) ×2 IMPLANT
SET CYSTO W/LG BORE CLAMP LF (SET/KITS/TRAYS/PACK) ×2 IMPLANT
SET IRRIG Y TYPE TUR BLADDER L (SET/KITS/TRAYS/PACK) ×2 IMPLANT
SLEEVE PROTECTION STRL DISP (MISCELLANEOUS) ×4 IMPLANT
SURGILUBE 2OZ TUBE FLIPTOP (MISCELLANEOUS) ×2 IMPLANT
SYR TOOMEY IRRIG 70ML (MISCELLANEOUS) ×2
SYRINGE TOOMEY IRRIG 70ML (MISCELLANEOUS) ×1 IMPLANT
TUBE PUMP MORCELLATOR PIRANHA (TUBING) ×2 IMPLANT
WATER STERILE IRR 1000ML POUR (IV SOLUTION) ×2 IMPLANT
WATER STERILE IRR 500ML POUR (IV SOLUTION) ×4 IMPLANT

## 2021-09-07 NOTE — Op Note (Signed)
Date of procedure: 09/07/21  Preoperative diagnosis:  Gross hematuria  Postoperative diagnosis:  Same  Procedure: Cystoscopy, clot evacuation, fulguration  Surgeon: Nickolas Madrid, MD  Anesthesia: General  Complications: None  Intraoperative findings:  Evacuation of 20 cc clot Small area of oozing at trigone and posterior bladder wall, no significant bleeding within prostatic fossa Excellent hemostasis  EBL: Minimal  Specimens: None  Drains: 24 [place] three-way  Indication: Richard Gill. is a 65 y.o. patient who underwent HOLEP earlier this morning and had persistent gross hematuria in PACU despite CBI.  After reviewing the management options for treatment, they elected to proceed with the above surgical procedure(s). We have discussed the potential benefits and risks of the procedure, side effects of the proposed treatment, the likelihood of the patient achieving the goals of the procedure, and any potential problems that might occur during the procedure or recuperation. Informed consent has been obtained.  Description of procedure:  The patient was taken to the operating room and general anesthesia was induced. SCDs were placed for DVT prophylaxis.. The patient was placed in the dorsal lithotomy position, prepped and draped in the usual sterile fashion, and preoperative antibiotics were administered. A preoperative time-out was performed.   A 26 French resectoscope with visual obturator was used to intubate the urethra and normal urethra was followed proximally in the bladder.  There was a wide open prostatic fossa.  Toomey syringe was used to irrigate the bladder and 20 mL old clot was evacuated.  Within the bladder, there was some oozing at both the trigone and the posterior bladder wall.  This was likely from the challenging morcellation.  No bladder perforation or muscle fibers were seen.  The bipolar button was used to achieve meticulous hemostasis.  The ureteral orifices  were clearly identified and remained intact at the conclusion of fulguration.  Within the prostatic fossa there was no significant bleeding noted.  With the bladder completely decompressed, urine was clear.  A 24 French three-way Foley was passed using a catheter guide into the bladder with return of clear fluid, 60 mL were placed in the balloon.  The catheter is irrigated with saline and remained crystal-clear.  It was connected to medium CBI and drainage.  Disposition: Stable to PACU  Plan: Wean CBI in PACU, keep follow-up Monday for Foley removal  Nickolas Madrid, MD

## 2021-09-07 NOTE — Anesthesia Preprocedure Evaluation (Signed)
Anesthesia Evaluation  Patient identified by MRN, date of birth, ID band Patient awake    Reviewed: Allergy & Precautions, NPO status , Patient's Chart, lab work & pertinent test results  History of Anesthesia Complications Negative for: history of anesthetic complications  Airway Mallampati: II  TM Distance: >3 FB Neck ROM: full    Dental  (+) Chipped   Pulmonary neg shortness of breath, former smoker,    Pulmonary exam normal        Cardiovascular Exercise Tolerance: Good hypertension, (-) angina(-) Past MI and (-) DOE Normal cardiovascular exam     Neuro/Psych negative neurological ROS  negative psych ROS   GI/Hepatic negative GI ROS, Neg liver ROS,   Endo/Other  diabetes, Type 2, Insulin Dependent  Renal/GU CRFRenal disease     Musculoskeletal   Abdominal   Peds  Hematology negative hematology ROS (+)   Anesthesia Other Findings Past Medical History: No date: BPH (benign prostatic hyperplasia) No date: Chronic kidney disease, stage II (mild)     Comment:  Dr. Justin Mend follows- holding steady No date: Hyperlipidemia 04/2012: MGUS (monoclonal gammopathy of unknown significance)     Comment:  cytogenetics on 04/20/13 was normal.  No date: Prostatitis No date: Sickle cell trait ([place]) 2005: T2DM (type 2 diabetes mellitus) ([place])     Comment:  lantus x1 year  Past Surgical History: 08-25-13: BUNIONECTOMY; Bilateral 2014: COLONOSCOPY     Comment:  Dr. Benson Norway, was told to f/u in 2017-2019 09/06/2013: TRANSURETHRAL RESECTION OF PROSTATE; N/A     Comment:  Procedure: TRANSURETHRAL RESECTION OF THE PROSTATE WITH               GYRUS INSTRUMENTS;  Surgeon: [name], MD;                Location: [facility];  Service: Urology;  Laterality: N/A;  BMI    Body Mass Index: 22.59 kg/m      Reproductive/Obstetrics negative OB ROS                             Anesthesia Physical Anesthesia  Plan  ASA: 3  Anesthesia Plan: General ETT   Post-op Pain Management:    Induction: Intravenous  PONV Risk Score and Plan: Ondansetron, Dexamethasone, Midazolam and Treatment may vary due to age or medical condition  Airway Management Planned: Oral ETT  Additional Equipment:   Intra-op Plan:   Post-operative Plan: Extubation in OR  Informed Consent: I have reviewed the patients History and Physical, chart, labs and discussed the procedure including the risks, benefits and alternatives for the proposed anesthesia with the patient or authorized representative who has indicated his/her understanding and acceptance.     Dental Advisory Given  Plan Discussed with: Anesthesiologist, CRNA and Surgeon  Anesthesia Plan Comments: (Patient consented for risks of anesthesia including but not limited to:  - adverse reactions to medications - damage to eyes, teeth, lips or other oral mucosa - nerve damage due to positioning  - sore throat or hoarseness - Damage to heart, brain, nerves, lungs, other parts of body or loss of life  Patient voiced understanding.)        Anesthesia Quick Evaluation

## 2021-09-07 NOTE — Interval H&P Note (Signed)
UROLOGY H&P UPDATE  Agree with prior H&P dated 08/09/21. Bladder stones, 140g prostate, BPH symptoms and recurrent gross hematuria.  Cardiac: RRR Lungs: CTA bilaterally  Laterality: N/A Procedure: cystolitholopaxy and HoLEP  Urine: culture 9/1 no growth  We discussed the risks and benefits of HoLEP at length.  The procedure requires general anesthesia and takes 1 to 2 hours, and a holmium laser is used to enucleate the prostate and push this tissue into the bladder.  A morcellator is then used to remove this tissue, which is sent for pathology.  The vast majority(>95%) of patients are able to discharge the same day with a catheter in place for 2 to 3 days, and will follow-up in clinic for a voiding trial.  We specifically discussed the risks of bleeding, infection, retrograde ejaculation, temporary urgency and urge incontinence, very low risk of long-term incontinence, urethral stricture/bladder neck contracture, pathologic evaluation of prostate tissue and possible detection of prostate cancer or other malignancy, and possible need for additional procedures.   Billey Co, MD 09/07/2021

## 2021-09-07 NOTE — Op Note (Signed)
Date of procedure: 09/07/21  Preoperative diagnosis:  BPH with outlet obstruction  Postoperative diagnosis:  Same  Procedure: HoLEP (Holmium Laser Enucleation of the Prostate)  Surgeon: Nickolas Madrid, MD  Anesthesia: General  Complications: None  Intraoperative findings:  Large and irregular appearing prostate with somewhat cystic appearance at the bladder neck, obstructing lateral lobes Mild trabeculations, no bladder stones or other suspicious lesions 3.  Ureteral orifices and verumontanum intact at conclusion of case  EBL: 50 mL  Specimens: Prostate chips  Intra-op weight: 100 g  Drains: 24 French three-way, 60 cc in balloon  Indication: Richard Gill. is a 65 y.o. patient with BPH with outlet obstruction symptoms and recurrent gross hematuria.  After reviewing the management options for treatment, they elected to proceed with the above surgical procedure(s). We have discussed the potential benefits and risks of the procedure, side effects of the proposed treatment, the likelihood of the patient achieving the goals of the procedure, and any potential problems that might occur during the procedure or recuperation.  We specifically discussed the risks of bleeding, infection, hematuria and clot retention, need for additional procedures, possible overnight hospital stay, temporary urgency and incontinence, rare long-term incontinence, and retrograde ejaculation.  Informed consent has been obtained.   Description of procedure:  The patient was taken to the operating room and general anesthesia was induced.  The patient was placed in the dorsal lithotomy position, prepped and draped in the usual sterile fashion, and preoperative antibiotics(Ancef) were administered.  SCDs were placed for DVT prophylaxis.  A preoperative time-out was performed.   Richard Gill sounds were used to gently dilated the urethra up to 84F. The 44 French continuous flow resectoscope was inserted into the  urethra using the visual obturator  The prostate was large with obstructing lateral lobes and irregular appearing with an irregular channel at the bladder neck. The bladder was thoroughly inspected and notable for mild trabeculations but no suspicious lesions and no bladder stones.  The ureteral orifices were located in orthotopic position.  The laser was set to 2 J and 60 Hz and was used to make a lambda incision just proximal to the verumontanum down to the level of the capsule.  A 6 o'clock incision was then made down to the level of the capsule from the bladder neck to the verumontanum.  The lateral lobes were then incised circumferentially until they were disconnected from the surrounding tissue.  The capsule was examined and laser was used for meticulous hemostasis.    The 32 French resectoscope was then switched out for the 46 French nephroscope and the lobes were morcellated and the tissue sent to pathology.  The tissue was very rubbery and morcellation was more challenging than usual.  After morcellation, vision was not clear, and I opted to pass the resectoscope with the bipolar button for more meticulous hemostasis.  There were 2 small arterial bleeders at 12:00, and these were fulgurated.  Thorough inspection revealed no other active bleeding.  A 24 French three-way catheter was inserted easily with the aid of the catheter guide, and 60 cc were placed in the balloon.  Urine was faint pink.  The catheter irrigated easily with a Toomey syringe.  CBI was initiated. A belladonna suppository was placed.  The patient tolerated the procedure well without any immediate complications and was extubated and transferred to the recovery room in stable condition.  Urine was light pink on fast CBI.  Disposition: Stable to PACU  Plan: Wean CBI in PACU, anticipate  discharge home today with void trial in clinic in 2-3 days  Nickolas Madrid, MD 09/07/2021

## 2021-09-07 NOTE — Transfer of Care (Signed)
Immediate Anesthesia Transfer of Care Note  Patient: Richard Gill.  Procedure(s) Performed: HOLEP-LASER ENUCLEATION OF THE PROSTATE WITH MORCELLATION  Patient Location: PACU  Anesthesia Type:General  Level of Consciousness: awake and alert   Airway & Oxygen Therapy: Patient Spontanous Breathing  Post-op Assessment: Report given to RN and Post -op Vital signs reviewed and stable  Post vital signs: Reviewed and stable  Last Vitals:  Vitals Value Taken Time  BP 96/65 09/07/21 1230  Temp    Pulse 59 09/07/21 1233  Resp 18 09/07/21 1233  SpO2 100 % 09/07/21 1233  Vitals shown include unvalidated device data.  Last Pain:  Vitals:   09/07/21 0718  TempSrc: Oral  PainSc: 0-No pain         Complications: No notable events documented.

## 2021-09-07 NOTE — Anesthesia Procedure Notes (Signed)
Procedure Name: LMA Insertion Date/Time: 09/07/2021 2:21 PM Performed by: Kerri Perches, CRNA Pre-anesthesia Checklist: Patient being monitored, Timeout performed, Suction available, Emergency Drugs available and Patient identified Patient Re-evaluated:Patient Re-evaluated prior to induction Oxygen Delivery Method: Circle system utilized Preoxygenation: Pre-oxygenation with 100% oxygen Induction Type: IV induction Ventilation: Mask ventilation without difficulty LMA: LMA inserted LMA Size: 4.5 Tube type: Oral Number of attempts: 1 Placement Confirmation: positive ETCO2 and breath sounds checked- equal and bilateral Dental Injury: Teeth and Oropharynx as per pre-operative assessment

## 2021-09-07 NOTE — Anesthesia Preprocedure Evaluation (Signed)
Anesthesia Evaluation  Patient identified by MRN, date of birth, ID band Patient awake    Reviewed: Allergy & Precautions, NPO status , Patient's Chart, lab work & pertinent test results  History of Anesthesia Complications Negative for: history of anesthetic complications  Airway Mallampati: II  TM Distance: >3 FB Neck ROM: full    Dental  (+) Chipped   Pulmonary neg shortness of breath, former smoker,    Pulmonary exam normal        Cardiovascular Exercise Tolerance: Good hypertension, (-) angina(-) Past MI and (-) DOE Normal cardiovascular exam     Neuro/Psych negative neurological ROS  negative psych ROS   GI/Hepatic negative GI ROS, Neg liver ROS,   Endo/Other  diabetes, Type 2, Insulin Dependent  Renal/GU CRFRenal disease     Musculoskeletal   Abdominal   Peds  Hematology negative hematology ROS (+)   Anesthesia Other Findings Past Medical History: No date: BPH (benign prostatic hyperplasia) No date: Chronic kidney disease, stage II (mild)     Comment:  Dr. Justin Mend follows- holding steady No date: Hyperlipidemia 04/2012: MGUS (monoclonal gammopathy of unknown significance)     Comment:  cytogenetics on 04/20/13 was normal.  No date: Prostatitis No date: Sickle cell trait ([place]) 2005: T2DM (type 2 diabetes mellitus) ([place])     Comment:  lantus x1 year  Past Surgical History: 08-25-13: BUNIONECTOMY; Bilateral 2014: COLONOSCOPY     Comment:  Dr. Benson Norway, was told to f/u in 2017-2019 09/06/2013: TRANSURETHRAL RESECTION OF PROSTATE; N/A     Comment:  Procedure: TRANSURETHRAL RESECTION OF THE PROSTATE WITH               GYRUS INSTRUMENTS;  Surgeon: [name], MD;                Location: [facility];  Service: Urology;  Laterality: N/A;  BMI    Body Mass Index: 22.59 kg/m      Reproductive/Obstetrics negative OB ROS                             Anesthesia Physical Anesthesia  Plan  ASA: 3  Anesthesia Plan: General   Post-op Pain Management:    Induction: Intravenous  PONV Risk Score and Plan: 1 and Dexamethasone and Ondansetron  Airway Management Planned: LMA  Additional Equipment:   Intra-op Plan:   Post-operative Plan:   Informed Consent: I have reviewed the patients History and Physical, chart, labs and discussed the procedure including the risks, benefits and alternatives for the proposed anesthesia with the patient or authorized representative who has indicated his/her understanding and acceptance.     Dental advisory given  Plan Discussed with: CRNA and Anesthesiologist  Anesthesia Plan Comments:         Anesthesia Quick Evaluation

## 2021-09-07 NOTE — Anesthesia Procedure Notes (Signed)
Procedure Name: Intubation Date/Time: 09/07/2021 8:48 AM Performed by: Kerri Perches, CRNA Pre-anesthesia Checklist: Patient identified, Suction available, Patient being monitored and Emergency Drugs available Patient Re-evaluated:Patient Re-evaluated prior to induction Oxygen Delivery Method: Circle system utilized Preoxygenation: Pre-oxygenation with 100% oxygen Induction Type: IV induction Ventilation: Mask ventilation without difficulty Laryngoscope Size: McGraph and 3 Grade View: Grade I Tube type: Oral Airway Equipment and Method: Patient positioned with wedge pillow and Stylet Secured at: 23 cm Tube secured with: Tape Dental Injury: Teeth and Oropharynx as per pre-operative assessment

## 2021-09-07 NOTE — Transfer of Care (Signed)
Immediate Anesthesia Transfer of Care Note  Patient: Richard Gill.  Procedure(s) Performed: [place]  Patient Location: PACU  Anesthesia Type:General  Level of Consciousness: drowsy  Airway & Oxygen Therapy: Patient Spontanous Breathing  Post-op Assessment: Report given to RN  Post vital signs: stable  Last Vitals:  Vitals Value Taken Time  BP 108/80 09/07/21 1515  Temp 36 C 09/07/21 1515  Pulse 78 09/07/21 1515  Resp 11 09/07/21 1515  SpO2 100 % 09/07/21 1515  Vitals shown include unvalidated device data.  Last Pain:  Vitals:   09/07/21 1515  TempSrc: Skin  PainSc:          Complications: No notable events documented.

## 2021-09-07 NOTE — Progress Notes (Signed)
Urine persistently dark red in PACU despite CBI.  Catheter irrigates easily.  With repositioning catheter clears quickly, but darkens with change in position.  Concern for persistent bleeding, and with the upcoming weekend/storm I recommended returning to the OR to confirm meticulous hemostasis and fulguration of any bleeders.  Risk and benefits discussed at length.  Discussed with both patient and his wife, and informed consent obtained.  Nickolas Madrid, MD 09/07/2021

## 2021-09-07 NOTE — Anesthesia Postprocedure Evaluation (Signed)
Anesthesia Post Note  Patient: Richard Gill.  Procedure(s) Performed: HOLEP-LASER ENUCLEATION OF THE PROSTATE WITH MORCELLATION  Patient location during evaluation: PACU Anesthesia Type: General Level of consciousness: awake and alert and oriented Pain management: pain level controlled Vital Signs Assessment: post-procedure vital signs reviewed and stable Respiratory status: spontaneous breathing, nonlabored ventilation and respiratory function stable Cardiovascular status: blood pressure returned to baseline and stable Postop Assessment: no signs of nausea or vomiting Anesthetic complications: no  Pt being taken back to OR to eval bleeding.  No notable events documented.   Last Vitals:  Vitals:   09/07/21 1400 09/07/21 1415  BP: (!) 102/59 105/61  Pulse: (!) 55 (!) 53  Resp: 18 20  Temp:    SpO2: 100% 100%    Last Pain:  Vitals:   09/07/21 1415  TempSrc:   PainSc: 4                  Jaice Digioia

## 2021-09-07 NOTE — Discharge Instructions (Signed)

## 2021-09-08 ENCOUNTER — Encounter: Payer: Self-pay | Admitting: Family Medicine

## 2021-09-10 ENCOUNTER — Ambulatory Visit: Payer: Medicare HMO | Admitting: Physician Assistant

## 2021-09-10 ENCOUNTER — Other Ambulatory Visit: Payer: Self-pay

## 2021-09-10 ENCOUNTER — Ambulatory Visit (INDEPENDENT_AMBULATORY_CARE_PROVIDER_SITE_OTHER): Payer: Medicare HMO | Admitting: Physician Assistant

## 2021-09-10 ENCOUNTER — Encounter: Payer: Self-pay | Admitting: Physician Assistant

## 2021-09-10 VITALS — BP 143/61 | HR 63 | Ht 71.0 in | Wt 168.0 lb

## 2021-09-10 DIAGNOSIS — N400 Enlarged prostate without lower urinary tract symptoms: Secondary | ICD-10-CM

## 2021-09-10 NOTE — Progress Notes (Signed)
Catheter Removal  Patient is present today for a catheter removal.  59ml of water was drained from the balloon. A 24FR three way foley cath was removed from the bladder no complications were noted . Patient tolerated well.  Performed by: Debroah Loop, PA-C   Follow up/ Additional notes: Counseled the patient on normal postoperative findings including dysuria, gross hematuria, and urinary leakage.  Counseled him to wear absorbent underwear as needed and start Kegel exercises to increase urinary control.  Verbal and printed Kegel exercise instructions provided today.  Surgical pathology pending, will defer to Dr. Diamantina Providence to share results when they become available.  Patient expressed understanding, all questions answered.

## 2021-09-10 NOTE — Patient Instructions (Signed)
Congratulations on your recent HOLEP procedure! As discussed in clinic today, there are three main side effects that commonly occur after surgery: Burning or pain with urination: This typically resolves within 1 week of surgery. If you are still having significant pain with urination 10 days after surgery, please call our clinic. We may need to check you for a urinary tract infection at that point, though this is rare. Blood in the urine: This may come and go, but typically resolves completely within 3 weeks of surgery. If you are on blood thinners, it may take longer for the bleeding to resolve. As long as your urine remains thin and runny and you are not passing large clots (around the size of your palm), this is a normal postoperative finding. If you start to pass dark red urine or thick, ketchup-like urine, please call our office immediately. Urinary leakage or urgency: This tends to improve with time, with most patients becoming dry within around 3 months of surgery. You may wear absorbant underwear or liners for security during this time. To help you get dry faster, please make sure you are completing your Kegel exercises as instructed, with a set of 10 exercises completed up to three times daily.  

## 2021-09-10 NOTE — Anesthesia Postprocedure Evaluation (Signed)
Anesthesia Post Note  Patient: Richard Gill.  Procedure(s) Performed: [place]  Patient location during evaluation: PACU Anesthesia Type: General Level of consciousness: awake and alert and oriented Pain management: pain level controlled Vital Signs Assessment: post-procedure vital signs reviewed and stable Respiratory status: spontaneous breathing, nonlabored ventilation and respiratory function stable Cardiovascular status: blood pressure returned to baseline and stable Postop Assessment: no signs of nausea or vomiting Anesthetic complications: no   No notable events documented.   Last Vitals:  Vitals:   09/07/21 1711 09/07/21 1737  BP:  131/73  Pulse:  87  Resp:  18  Temp: (!) 36 C   SpO2:  98%    Last Pain:  Vitals:   09/07/21 1737  TempSrc:   PainSc: 4                  [name]

## 2021-09-11 ENCOUNTER — Telehealth: Payer: Self-pay

## 2021-09-11 LAB — SURGICAL PATHOLOGY

## 2021-09-11 NOTE — Telephone Encounter (Signed)
Called pt no answer. Left detailed message per DPR, advised to call back for questions or concerns.

## 2021-09-11 NOTE — Telephone Encounter (Signed)
-----   Message from Billey Co, MD sent at 09/11/2021  2:37 PM EDT ----- Doristine Devoid news, no prostate cancer on HOLEP specimen, keep follow-up as scheduled  Nickolas Madrid, MD 09/11/2021

## 2021-09-18 ENCOUNTER — Observation Stay (HOSPITAL_COMMUNITY)
Admission: EM | Admit: 2021-09-18 | Discharge: 2021-09-19 | Disposition: A | Discharge status: Home / Self Care | Payer: Medicare HMO | Attending: Emergency Medicine | Admitting: Emergency Medicine

## 2021-09-18 ENCOUNTER — Other Ambulatory Visit: Payer: Self-pay

## 2021-09-18 ENCOUNTER — Encounter (HOSPITAL_COMMUNITY): Payer: Self-pay | Admitting: Emergency Medicine

## 2021-09-18 ENCOUNTER — Emergency Department (HOSPITAL_COMMUNITY): Payer: Medicare HMO

## 2021-09-18 DIAGNOSIS — R2681 Unsteadiness on feet: Secondary | ICD-10-CM | POA: Diagnosis not present

## 2021-09-18 DIAGNOSIS — Z79899 Other long term (current) drug therapy: Secondary | ICD-10-CM | POA: Diagnosis not present

## 2021-09-18 DIAGNOSIS — N183 Chronic kidney disease, stage 3 unspecified: Secondary | ICD-10-CM | POA: Diagnosis not present

## 2021-09-18 DIAGNOSIS — Z794 Long term (current) use of insulin: Secondary | ICD-10-CM | POA: Insufficient documentation

## 2021-09-18 DIAGNOSIS — Z7982 Long term (current) use of aspirin: Secondary | ICD-10-CM | POA: Insufficient documentation

## 2021-09-18 DIAGNOSIS — Z20822 Contact with and (suspected) exposure to covid-19: Secondary | ICD-10-CM | POA: Diagnosis not present

## 2021-09-18 DIAGNOSIS — R55 Syncope and collapse: Principal | ICD-10-CM | POA: Diagnosis present

## 2021-09-18 DIAGNOSIS — D649 Anemia, unspecified: Secondary | ICD-10-CM | POA: Insufficient documentation

## 2021-09-18 DIAGNOSIS — Z743 Need for continuous supervision: Secondary | ICD-10-CM | POA: Diagnosis not present

## 2021-09-18 DIAGNOSIS — E1122 Type 2 diabetes mellitus with diabetic chronic kidney disease: Secondary | ICD-10-CM | POA: Diagnosis not present

## 2021-09-18 DIAGNOSIS — R569 Unspecified convulsions: Secondary | ICD-10-CM | POA: Diagnosis not present

## 2021-09-18 DIAGNOSIS — Z981 Arthrodesis status: Secondary | ICD-10-CM | POA: Diagnosis not present

## 2021-09-18 DIAGNOSIS — Z87891 Personal history of nicotine dependence: Secondary | ICD-10-CM | POA: Insufficient documentation

## 2021-09-18 DIAGNOSIS — R110 Nausea: Secondary | ICD-10-CM | POA: Diagnosis not present

## 2021-09-18 DIAGNOSIS — S199XXA Unspecified injury of neck, initial encounter: Secondary | ICD-10-CM | POA: Diagnosis not present

## 2021-09-18 DIAGNOSIS — I6529 Occlusion and stenosis of unspecified carotid artery: Secondary | ICD-10-CM | POA: Diagnosis not present

## 2021-09-18 DIAGNOSIS — M778 Other enthesopathies, not elsewhere classified: Secondary | ICD-10-CM | POA: Diagnosis not present

## 2021-09-18 DIAGNOSIS — R739 Hyperglycemia, unspecified: Secondary | ICD-10-CM | POA: Diagnosis not present

## 2021-09-18 DIAGNOSIS — M25522 Pain in left elbow: Secondary | ICD-10-CM | POA: Diagnosis not present

## 2021-09-18 LAB — CBC WITH DIFFERENTIAL/PLATELET
Abs Immature Granulocytes: 0.08 10*3/uL — ABNORMAL HIGH (ref 0.00–0.07)
Basophils Absolute: 0 10*3/uL (ref 0.0–0.1)
Basophils Relative: 0 %
Eosinophils Absolute: 0.4 10*3/uL (ref 0.0–0.5)
Eosinophils Relative: 4 %
HCT: 21.2 % — ABNORMAL LOW (ref 39.0–52.0)
Hemoglobin: 7.1 g/dL — ABNORMAL LOW (ref 13.0–17.0)
Immature Granulocytes: 1 %
Lymphocytes Relative: 17 %
Lymphs Abs: 1.8 10*3/uL (ref 0.7–4.0)
MCH: 29 pg (ref 26.0–34.0)
MCHC: 33.5 g/dL (ref 30.0–36.0)
MCV: 86.5 fL (ref 80.0–100.0)
Monocytes Absolute: 0.5 10*3/uL (ref 0.1–1.0)
Monocytes Relative: 5 %
Neutro Abs: 7.9 10*3/uL — ABNORMAL HIGH (ref 1.7–7.7)
Neutrophils Relative %: 73 %
Platelets: 332 10*3/uL (ref 150–400)
RBC: 2.45 MIL/uL — ABNORMAL LOW (ref 4.22–5.81)
RDW: 12.6 % (ref 11.5–15.5)
WBC: 10.7 10*3/uL — ABNORMAL HIGH (ref 4.0–10.5)
nRBC: 0 % (ref 0.0–0.2)

## 2021-09-18 LAB — URINALYSIS, ROUTINE W REFLEX MICROSCOPIC
Bilirubin Urine: NEGATIVE
Glucose, UA: >=500 mg/dL — AB
Ketones, ur: NEGATIVE mg/dL
Nitrite: NEGATIVE
Protein, ur: 30 mg/dL — AB
Specific Gravity, Urine: 1.013 (ref 1.005–1.030)
WBC, UA: >50 WBC/hpf — ABNORMAL HIGH (ref 0–5)
pH: 5 (ref 5.0–8.0)

## 2021-09-18 LAB — BASIC METABOLIC PANEL
Anion gap: 12 (ref 5–15)
BUN: 49 mg/dL — ABNORMAL HIGH (ref 8–23)
CO2: 18 mmol/L — ABNORMAL LOW (ref 22–32)
Calcium: 8.1 mg/dL — ABNORMAL LOW (ref 8.9–10.3)
Chloride: 102 mmol/L (ref 98–111)
Creatinine, Ser: 2.97 mg/dL — ABNORMAL HIGH (ref 0.61–1.24)
GFR, Estimated: 22 mL/min — ABNORMAL LOW (ref >60)
Glucose, Bld: 342 mg/dL — ABNORMAL HIGH (ref 70–99)
Potassium: 3.7 mmol/L (ref 3.5–5.1)
Sodium: 132 mmol/L — ABNORMAL LOW (ref 135–145)

## 2021-09-18 LAB — HEPATIC FUNCTION PANEL
ALT: 20 U/L (ref 0–44)
AST: 17 U/L (ref 15–41)
Albumin: 3.4 g/dL — ABNORMAL LOW (ref 3.5–5.0)
Alkaline Phosphatase: 115 U/L (ref 38–126)
Bilirubin, Direct: <0.1 mg/dL (ref 0.0–0.2)
Total Bilirubin: 0.5 mg/dL (ref 0.3–1.2)
Total Protein: 7.6 g/dL (ref 6.5–8.1)

## 2021-09-18 LAB — PREPARE RBC (CROSSMATCH)

## 2021-09-18 LAB — LIPASE, BLOOD: Lipase: 83 U/L — ABNORMAL HIGH (ref 11–51)

## 2021-09-18 LAB — RESP PANEL BY RT-PCR (FLU A&B, COVID) ARPGX2
Influenza A by PCR: NEGATIVE
Influenza B by PCR: NEGATIVE
SARS Coronavirus 2 by RT PCR: NEGATIVE

## 2021-09-18 LAB — TROPONIN I (HIGH SENSITIVITY)
Troponin I (High Sensitivity): 12 ng/L (ref <18)
Troponin I (High Sensitivity): 17 ng/L (ref <18)

## 2021-09-18 LAB — CBG MONITORING, ED
Glucose-Capillary: 285 mg/dL — ABNORMAL HIGH (ref 70–99)
Glucose-Capillary: 256 mg/dL — ABNORMAL HIGH (ref 70–99)
Glucose-Capillary: 286 mg/dL — ABNORMAL HIGH (ref 70–99)

## 2021-09-18 LAB — ABO/RH: ABO/RH(D): A POS

## 2021-09-18 MED ORDER — ACETAMINOPHEN 325 MG PO TABS: 650.0000 mg | ORAL_TABLET | Freq: Four times a day (QID) | ORAL | Status: DC | PRN

## 2021-09-18 MED ORDER — SODIUM CHLORIDE 0.9 % IV SOLN
10.0000 mL/h | Freq: Once | INTRAVENOUS | Status: AC
  Administered 2021-09-18: 10 mL/h via INTRAVENOUS

## 2021-09-18 MED ORDER — INSULIN ASPART 100 UNIT/ML IJ SOLN
0.0000 [IU] | Freq: Three times a day (TID) | INTRAMUSCULAR | Status: DC
  Administered 2021-09-18: 5 [IU] via SUBCUTANEOUS
  Administered 2021-09-19 (×2): 2 [IU] via SUBCUTANEOUS

## 2021-09-18 MED ORDER — ACETAMINOPHEN 650 MG RE SUPP: 650.0000 mg | Freq: Four times a day (QID) | RECTAL | Status: DC | PRN

## 2021-09-18 MED ORDER — EMPAGLIFLOZIN 10 MG PO TABS
10.0000 mg | ORAL_TABLET | Freq: Every day | ORAL | Status: DC
  Administered 2021-09-19: 10 mg via ORAL
  Filled 2021-09-18: qty 1

## 2021-09-18 MED ORDER — ROSUVASTATIN CALCIUM 20 MG PO TABS
40.0000 mg | ORAL_TABLET | Freq: Every day | ORAL | Status: DC
  Administered 2021-09-18 – 2021-09-19 (×2): 40 mg via ORAL
  Filled 2021-09-18 (×2): qty 2

## 2021-09-18 MED ORDER — ONDANSETRON 4 MG PO TBDP: 4.0000 mg | ORAL_TABLET | Freq: Three times a day (TID) | ORAL | Status: DC | PRN

## 2021-09-18 MED ORDER — LACTATED RINGERS IV BOLUS: 1000.0000 mL | Freq: Once | INTRAVENOUS | Status: DC

## 2021-09-18 MED ORDER — INSULIN GLARGINE-YFGN 100 UNIT/ML ~~LOC~~ SOLN
12.0000 [IU] | Freq: Every day | SUBCUTANEOUS | Status: DC
  Administered 2021-09-18 – 2021-09-19 (×2): 12 [IU] via SUBCUTANEOUS
  Filled 2021-09-18 (×2): qty 0.12

## 2021-09-18 MED ORDER — SODIUM CHLORIDE 0.9 % IV BOLUS
1000.0000 mL | Freq: Once | INTRAVENOUS | Status: AC
  Administered 2021-09-18: 1000 mL via INTRAVENOUS

## 2021-09-18 NOTE — ED Notes (Addendum)
Pt c/o nausea during orthostatic vitals. PA is aware.

## 2021-09-18 NOTE — H&P (Addendum)
[facility] Admission History and Physical Service Pager: [phone]  Patient name: Richard Gill. Medical record number: [id number] Date of birth: 06-Apr-1956 Age: 65 y.o. Gender: male  Primary Care Provider: Leeanne Rio, MD Consultants: None Code Status: Full  Preferred Emergency Contact: Richard Gill (spouse) [phone]  Chief Complaint: Syncope  Assessment and Plan: Richard Gill. is a 65 y.o. male presenting from home with syncope. PMH is significant for BPH, gross hematuria, bladder stones, s/p recent HOLEP prostatectomy, CKD stage 3b, T2DM, HLD, sickle cell trait, smoldering multiple myeloma, and chronic normocytic anemia.  Syncopal episode with total loss of consciousness and head trauma Patient presents from home via EMS following syncopal episode at 4:30 AM this morning. Per patient's wife, the patient got up to go to the bathroom and fell backwards landing on tile floor. Prior to the syncopal event, the patient says that he felt dizzy and warm and could "feel it coming on."  Of note, since his HELOP procedure with urology on 09/07/2021 (done due to gross hematuria and BPH with lower urinary tract symptoms), he has been having near syncopal episodes and overall not been feeling at baseline.  In the ED, he presented with BP 122/66, 69 HR, 13 RR, SPO2 100% on room air. Orthostatic vital signs were positive. Labs remarkable for WBC 10.7, hemoglobin 7.1, Na+ 132, K+ 3.7, glucose 342, creatinine 2.97, BUN 49, elevated lipase to 83.  Urinalysis revealed >500 glucose, moderate leukocytes, >50 WBC, rare bacteria.  Imaging including left elbow x-ray, CT head, and CT cervical spine were within normal limits.   Chest x-ray showed no focal consolidation or pulmonary edema, but did have small nodular opacities in the lateral right lower lobe.  It is most likely that the syncopal episode is related to the patient's symptomatic anemia with poor p.o. intake and  postoperative blood loss after HELOP procedure 2 weeks ago.  This is less likely cardiac in nature as his EKG was reassuring, troponin negative x2, he denied any cardiac symptoms concerning for ACS including typical chest pain symptoms, palpitations. Echocardiogram is pending. This is also unlikely secondary to a CVA as he did not have any facial drooping, slurring of words, and head imaging was also reassuring against any acute intracranial process. History not concerning for seizure. -Admit to med telemetry, FPTS, attending Dr. McDiarmid -Vital signs per unit floor protocol -Up with assistance -Transfuse 1 unit PRBC -Follow-up posttransfusion H/H -Continuous cardiac monitoring -PT/OT eval and treat -Tylenol every 6 hours as needed for pain or fever -AM magnesium, CBC, BMP -Follow-up echocardiogram results  Acute on chronic normocytic anemia Hgb on admission 7.1. He is symptomatic with significant fatigue, dizziness, and now syncopal episode x1. No dyspnea, palpitations, or tachycardia, but he has positive orthostatic vitals. Patient has history of anemia, but he is unsure what his baseline Hgb is. Last Hgb 10.9 on 07/25/2021 per our records. Previously ~8.7 in 2016. Never received blood transfusions in the past.  Etiology likely secondary to CKD as well as recent blood loss related to his HELOP. Also with hx of multiple myeloma, but at his appointment with hematology/oncology, it was not thought to bethe cause of his anemia considering low burden of disease on marrow evaluation.   -Patient to receive 1 unit PRBC -Posttransfusion H/H  Recent HOLEP prostate resection  Procedure completed on 3/54 which was complicated by blood loss.  He was discharged with a Foley catheter which was subsequently removed on 10/3. Since then, he has had  post void vagal episodes with hot/clammy feeling of near syncope.  ED provider discussed urinalysis today with the patient's urologist who said that this is not  consistent with infection, does not require antibiotic treatment and that the hemoglobin in his urine is to be expected. -We will consult with patient's urologist tomorrow for further information -Monitor urine output and for gross hematuria  Type 2 Diabetes CBG 370 today.  Last A1c 7.9% on 08/02/2021. Home meds: Jardiance 26m daily, glargine 25u daily (took 30 last night b/c glucometer read HIGH) -CBG monitoring qAC and qHS -Semglee 12 units every morning -Sensitive SSI coverage -Continue home Jardiance  Hypertension BP on admission 122/66. Chronic, stable.  Not on antihypertensive as his blood pressure readings were within goal.  He checks blood pressure readings at home with readings less than 150/90 -Monitor blood pressure  CKD Stage 3 Serum creatinine 2.97 on admission.  Baseline creatinine approx 2.5.  [facility]  The patient's hematologist recommended an appointment in May to discuss the merits of renal biopsy with Dr. WJustin Mendto evaluate for monoclonal gammopathy of renal significance versus diabetic versus hypertensive nephropathy.   -Monitor renal function with daily labs  HLD Chronic and stable.  Home medication: Rosuvastatin 444mdaily -Continue home medication  IgG lambda smoldering multiple myeloma He [facility] Last seen on 04/30/2021 with no evidence of disease progression.  FEN/GI: Carb modified diet Prophylaxis: SCDs  Disposition: med-tele  History of Present Illness:  WaTarence Searcyis a [age].o. male presenting with syncopal episode. Per wife, patient got up to use the bathroom and passed out. He was unconscious for less than 2 minutes. The episode occurred around 4:30 or 5:00am. No preceding chest pain, palpitations, fluttering. No facial drooping, slurring of words.  No rhythmic movements or jerking of extremities, no tongue bite.  Patient had a HOLEP procedure with  urology on 08/16/74/7972complicated by bleeding.  Prior to the surgery, the patient was in his usual state of health.  Since then he has been experiencing nausea with loss of appetite and weakness. Has had several near-syncopal episodes but today was the first time he actually passed out.   Review Of Systems: Per HPI with the following additions:   Review of Systems  Constitutional:  Positive for fatigue. Negative for chills and fever.  HENT:  Negative for sore throat.   Respiratory:  Negative for shortness of breath.   Cardiovascular:  Negative for chest pain and palpitations.  Gastrointestinal:  Positive for nausea.  Neurological:  Positive for syncope and light-headedness. Negative for tremors, seizures, facial asymmetry, weakness, numbness and headaches.    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   Itching 11/17/2020   BPPV (benign paroxysmal positional vertigo), right 12/07/2018   Low back pain 03/27/2017   Lateral pain of left hip 03/27/2017   Urinary retention due to benign prostatic hyperplasia    Nephrolithiasis    Rash and nonspecific skin eruption 05/03/2013   Monoclonal gammopathy    Chronic kidney disease, stage II (mild)    Hives 03/29/2013   Positive double stranded DNA antibody test 02/09/2013   Body aches 02/04/2013   HTN (hypertension) 11/03/2012   Elevated serum creatinine 10/16/2012   T2DM (type 2 diabetes mellitus) ([place]02/16/2012   BPH (benign prostatic hyperplasia) 01/24/2011   Hyperlipidemia 01/24/2011    Past Medical History: Past Medical History:  Diagnosis Date  BPH (benign prostatic hyperplasia)    Chronic kidney disease, stage II (mild)    Dr. 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    Prostatitis    Sickle cell trait ([place])    T2DM (type 2 diabetes mellitus) ([place]) 2005   lantus x1 year     Past Surgical History: Past Surgical History:  Procedure Laterality Date   BUNIONECTOMY Bilateral 08-25-13   COLONOSCOPY  2014   Dr. Benson Norway, was told to f/u in 2017-2019   [place] N/A 09/07/2021   Procedure: CYSTOSCOPY WITH FULGERATION;  Surgeon: Billey Co, MD;  Location: [facility] ORS;  Service: Urology;  Laterality: N/A;   HOLEP-LASER ENUCLEATION OF THE PROSTATE WITH MORCELLATION N/A 09/07/2021   Procedure: HOLEP-LASER ENUCLEATION OF THE PROSTATE WITH MORCELLATION;  Surgeon: Billey Co, MD;  Location: [facility] ORS;  Service: Urology;  Laterality: N/A;   TRANSURETHRAL RESECTION OF PROSTATE N/A 09/06/2013   Procedure: TRANSURETHRAL RESECTION OF THE PROSTATE WITH GYRUS INSTRUMENTS;  Surgeon: [name], MD;  Location: [facility] ORS;  Service: Urology;  Laterality: N/A;    Social History: Social History   Tobacco Use   Smoking status: Former    Packs/day: 1.00    Years: 10.00    Pack years: 10.00    Types: Cigarettes    Quit date: 01/28/1991    Years since quitting: 30.6   Smokeless tobacco: Never  Vaping Use   Vaping Use: Never used  Substance Use Topics   Alcohol use: No   Drug use: No    Family History: Family History  Problem Relation Age of Onset   Heart attack Father 90   Stroke Mother    Colon cancer Sister 83   Colon cancer Sister 35   Allergies and Medications: Allergies  Allergen Reactions   Smz-Tmp Ds [Sulfamethoxazole-Trimethoprim] Other (See Comments)    Headache, joint pain fever, thought he was "dying"   Magnesium-Containing Compounds Other (See Comments)    Back Pain   No current facility-administered medications on file prior to encounter.   Current Outpatient Medications on File Prior to Encounter  Medication Sig Dispense Refill   aspirin EC 81 MG tablet Take 81 mg by mouth daily. Swallow whole.     empagliflozin (JARDIANCE) 10 MG TABS tablet Take 1 tablet (10 mg total) by mouth daily. 90 tablet 3   hydrOXYzine  (ATARAX/VISTARIL) 25 MG tablet TAKE 1 TABLET BY MOUTH ONCE DAILY AS NEEDED (Patient taking differently: Take 25 mg by mouth daily as needed for itching.) 90 tablet 2   Insulin Glargine (BASAGLAR KWIKPEN) 100 UNIT/ML Inject 25 Units into the skin daily. (Patient taking differently: Inject 30 Units into the skin daily.) 15 mL 2   rosuvastatin (CRESTOR) 40 MG tablet Take 1 tablet by mouth once daily (Patient taking differently: Take 40 mg by mouth daily.) 90 tablet 1   glucose blood (RELION GLUCOSE TEST STRIPS) test strip Check sugar 3 times daily 100 each 12   Lancets (ACCU-CHEK SOFT TOUCH) lancets Use as instructed 100 each 12   ondansetron (ZOFRAN ODT) 4 MG disintegrating tablet Take 1 tablet (4 mg total) by mouth every 8 (eight) hours as needed for nausea or vomiting. (Patient not taking: Reported on 09/18/2021) 10 tablet 0   RELION PEN NEEDLES 31G X 6 MM MISC USE 1  ONCE DAILY 50 each 11    Objective: BP 123/67   Pulse Marland Kitchen)  58   Temp 97.8 F (36.6 C) (Oral)   Resp 11   Ht 5' 11" (1.803 m)   Wt 82 kg   SpO2 100%   BMI 25.21 kg/m  Exam: General: Frail, tired-appearing 65 year old male lying in hospital stretcher with numerous blankets on, pleasant Eyes: EOMI, pupils PERRLA, conjunctival pallor ENTM: Mucous membranes moist, good dentition, no pharyngeal erythema or exudate, tongue midline Neck: Supple, no neck rigidity, Cardiovascular: Regular rate and rhythm, no murmurs appreciated Respiratory: Limited lung exam secondary to patient weakness and unable to lean up in the bed, anteriorly lungs have good aeration and no wheezing appreciated Gastrointestinal: Thin, soft, nondistended, nontender MSK: No joint deformities Derm: Warm, dry, well perfused. Neuro: Awake, alert and oriented.  Answers questions appropriately.  Speech clear and fluent.Tongue protrudes midline and moves symmetrically.  Psych: Diminished affect.   Labs and Imaging: CBC BMET  Recent Labs  Lab 09/18/21 0648  WBC  10.7*  HGB 7.1*  HCT 21.2*  PLT 332   Recent Labs  Lab 09/18/21 0648  NA 132*  K 3.7  CL 102  CO2 18*  BUN 49*  CREATININE 2.97*  GLUCOSE 342*  CALCIUM 8.1*     EKG: Normal sinus rhythm, 65 bpm.  Leads likely placed incorrectly as voltages are inverted in lead I, aVR.  No acute ST or T wave abnormalities   Orvis Brill, DO 09/18/2021, 2:28 PM PGY-1, [place] Intern pager: [phone], text pages welcome  FPTS Upper-Level Resident Addendum  I have independently interviewed and examined the patient. I have discussed the above with Dr. Owens Shark and agree with the documented plan. My edits for correction/addition/clarification are included above. Please see any attending notes.   Alcus Dad, MD PGY-2, [place] Service pager: [phone] (text pages welcome through [place])

## 2021-09-18 NOTE — Progress Notes (Addendum)
FPTS Brief Progress Note  S:Patient is alert and comfortable, daughter is at bedside. Patient states that he is able to void spontaneously, has voided 3-4 times this evening. He states he has not had hematuria since Friday 10/7. He denies dysuria or difficulty with urination. He reports feeling "like a million bucks and ready to go to work in the AM".    O: BP 105/78   Pulse 69   Temp 98 F (36.7 C) (Oral)   Resp 13   Ht 5\' 11"  (1.803 m)   Wt 82 kg   SpO2 100%   BMI 25.21 kg/m   Gen: male in NAD, smiling, lying supine in bed   A/P: Stable.  -will f/u post transfusion Hgb, patient has received 1 unit pRBCs -will follow up bladder scan to check for urine retention in setting of recent hematuria - Orders reviewed. Labs for AM ordered, which was adjusted as needed.    Eulis Foster, MD 09/18/2021, 9:18 PM PGY-3, [facility] Night Resident  Please page [phone] with questions.

## 2021-09-18 NOTE — Progress Notes (Addendum)
Inpatient Diabetes Program Recommendations  AACE/ADA: New Consensus Statement on Inpatient Glycemic Control (2015)  Target Ranges:  Prepandial:   less than 140 mg/dL      Peak postprandial:   less than 180 mg/dL (1-2 hours)      Critically ill patients:  140 - 180 mg/dL   Lab Results  Component Value Date   GLUCAP 285 (H) 09/18/2021   HGBA1C 7.9 (A) 08/02/2021    Review of Glycemic Control Results for Richard Gill, Richard Gill (MRN [id number]) as of 09/18/2021 11:38  Ref. Range 09/18/2021 06:27  Glucose-Capillary Latest Ref Range: 70 - 99 mg/dL 285 (H)   Diabetes history: DM 2 Outpatient Diabetes medications: Jardiance 10 mg Daily, Basaglar 25 units Daily Current orders for Inpatient glycemic control: None  Inpatient Diabetes Program Recommendations:    If pt admitted, consider adding Semglee 15 units, Novolog 0-15 units tid + hs scale.   Thanks,  Tama Headings RN, MSN, BC-ADM Inpatient Diabetes Coordinator Team Pager [phone] (8a-5p)

## 2021-09-18 NOTE — ED Notes (Addendum)
Duplicate note

## 2021-09-18 NOTE — ED Provider Notes (Signed)
I provided a substantive portion of the care of this patient.  I personally performed the entirety of the medical decision making for this encounter.      Patient here with syncopal event just prior to arrival.  Has evidence of anemia likely from postoperative loss.  Plan is to transfuse patient and admit to the hospitalist   Lacretia Leigh, MD 09/18/21 1142

## 2021-09-18 NOTE — ED Provider Notes (Signed)
Emergency Medicine Provider Triage Evaluation Note  Richard Gill. , a 65 y.o. male  was evaluated in triage.  Pt complains of history includes diabetes, multiple myeloma in remission, hypertension presented today following a syncopal episode. Patient arrives with his wife who provides supplemental history.  Patient was in normal state of health patient walking back from bathroom this morning when he passed out, woke up on the floor unsure what happened.  No chest pain or shortness of breath preceding episode.  Patient was found by his wife, reports he was unconscious for around 2 minutes and she believes that he hit his head.  No reported seizure-like activity.  Review of Systems  Positive: Syncope Negative: Chest pain, shortness of breath, fever  Physical Exam  BP 94/68   Pulse 68   Temp 97.8 F (36.6 C) (Oral)   Resp 18   Ht _0  (1.803 m)   Wt 82 kg   SpO2 100%   BMI 25.21 kg/m  Gen:   Awake, no distress   Resp:  Normal effort  MSK:   Moves extremities without difficulty  Other:  Alert oriented x4 Medical Decision Making  Medically screening exam initiated at 9:02 AM.  Appropriate orders placed.  [name]. was informed that the remainder of the evaluation will be completed by another provider, this initial triage assessment does not replace that evaluation, and the importance of remaining in the ED until their evaluation is complete.   Note: Portions of this report may have been transcribed using voice recognition software. Every effort was made to ensure accuracy; however, inadvertent computerized transcription errors may still be present.    Gari Crown 10/09/21 3151    Teressa Lower, MD 10/10/21 2232

## 2021-09-18 NOTE — ED Notes (Signed)
Patient transported to CT 

## 2021-09-18 NOTE — ED Notes (Signed)
Received pt ao x 4, NAD. Blood transfusion going. Family at bedside.

## 2021-09-18 NOTE — ED Triage Notes (Signed)
Patient arrived with EMS from home , brief syncopal episode / fell this morning , reports left elbow pain , he received Zofran 4 mg IV and NS 500 ml bolus by EMS prior to arrival . CBG=395 .

## 2021-09-18 NOTE — ED Provider Notes (Signed)
[facility] EMERGENCY DEPARTMENT Provider Note   CSN: [id number] Arrival date & time: 09/18/21  0615     History Chief Complaint  Patient presents with   Syncope    Richard Gill. is a 65 y.o. male who presents with concern for syncopal episode this morning. Patient underwent HoLEP procedure for prostatic resection on 09/07/2021 for BPH.  Following procedure had some hematuria and Foley catheter however this resolved a week ago.  Patient did have Foley catheter removed 8 days ago on 10/3.  Since Foley removal patient has been experiencing episodes of near syncope immediately following urination.  Endorses sensation of severe lightheadedness, hot flash, and occasional diaphoresis with onset during urination and near syncopal episodes immediately following completion of voiding.  Denies any pain associated with his urination or any frank hematuria in the last week.  This morning he did completely syncopized after urination and was unconscious for approximately 60 seconds per his wife.  No seizure-like activity seen at home.  Patient presents via EMS.  I personally reviewed this patient's medical records.  His type 2 diabetes, MGUS, and stage II CKD. Does endorse weakness throughout his recovery following his surgery.   HPI     Past Medical History:  Diagnosis Date   BPH (benign prostatic hyperplasia)    Chronic kidney disease, stage II (mild)    Dr. 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   Itching 11/17/2020   BPPV (benign paroxysmal positional vertigo), right 12/07/2018   Low back pain 03/27/2017   Lateral pain of left hip 03/27/2017   Urinary retention due to  benign prostatic hyperplasia    Nephrolithiasis    Rash and nonspecific skin eruption 05/03/2013   Monoclonal gammopathy    Chronic kidney disease, stage II (mild)    Hives 03/29/2013   Positive double stranded DNA antibody test 02/09/2013   Body aches 02/04/2013   HTN (hypertension) 11/03/2012   Elevated serum creatinine 10/16/2012   T2DM (type 2 diabetes mellitus) ([place]) 01/24/2011   BPH (benign prostatic hyperplasia) 01/24/2011   Hyperlipidemia 01/24/2011    Past Surgical History:  Procedure Laterality Date   BUNIONECTOMY Bilateral 08-25-13   COLONOSCOPY  2014   Dr. Benson [place], was told to f/u in 2017-2019   [place] N/A 09/07/2021   Procedure: CYSTOSCOPY WITH FULGERATION;  Surgeon: Billey Co, MD;  Location: [facility] ORS;  Service: Urology;  Laterality: N/A;   HOLEP-LASER ENUCLEATION OF THE PROSTATE WITH MORCELLATION N/A 09/07/2021   Procedure: HOLEP-LASER ENUCLEATION OF THE PROSTATE WITH MORCELLATION;  Surgeon: Billey Co, MD;  Location: [facility] ORS;  Service: Urology;  Laterality: N/A;   TRANSURETHRAL RESECTION OF PROSTATE N/A 09/06/2013   Procedure: TRANSURETHRAL RESECTION OF THE PROSTATE WITH GYRUS INSTRUMENTS;  Surgeon: [name], MD;  Location: [facility] ORS;  Service: Urology;  Laterality: N/A;       Family History  Problem Relation Age of Onset   Heart attack Father 14   Stroke Mother    Colon cancer Sister 61   Colon cancer Sister 80    Social History   Tobacco Use   Smoking status: Former  Packs/day: 1.00    Years: 10.00    Pack years: 10.00    Types: Cigarettes    Quit date: 01/28/1991    Years since quitting: 30.6   Smokeless tobacco: Never  Vaping Use   Vaping Use: Never used  Substance Use Topics   Alcohol use: No   Drug use: No    Home Medications Prior to Admission medications   Medication Sig Start Date End Date Taking? Authorizing Provider  aspirin EC 81 MG tablet Take 81 mg by mouth daily. Swallow whole.   Yes [provider]  empagliflozin (JARDIANCE) 10 MG TABS tablet Take 1 tablet (10 mg total) by mouth daily. 08/02/21  Yes Leeanne Rio, MD  hydrOXYzine (ATARAX/VISTARIL) 25 MG tablet TAKE 1 TABLET BY MOUTH ONCE DAILY AS NEEDED Patient taking differently: Take 25 mg by mouth daily as needed for itching. 07/16/21  Yes Leeanne Rio, MD  Insulin Glargine [facility]) 100 UNIT/ML Inject 25 Units into the skin daily. Patient taking differently: Inject 30 Units into the skin daily. 05/24/21  Yes Chambliss, Jeb Levering, MD  rosuvastatin (CRESTOR) 40 MG tablet Take 1 tablet by mouth once daily Patient taking differently: Take 40 mg by mouth daily. 04/30/21  Yes Leeanne Rio, MD  glucose blood (RELION GLUCOSE TEST STRIPS) test strip Check sugar 3 times daily 01/05/19   Leeanne Rio, MD  Lancets (ACCU-CHEK SOFT [facility]) lancets Use as instructed 09/29/18   Martyn Malay, MD  ondansetron (ZOFRAN ODT) 4 MG disintegrating tablet Take 1 tablet (4 mg total) by mouth every 8 (eight) hours as needed for nausea or vomiting. Patient not taking: Reported on 09/18/2021 09/07/21   Billey Co, MD  RELION PEN NEEDLES 31G X 6 MM MISC USE 1  ONCE DAILY 07/16/21   Leeanne Rio, MD    Allergies    Smz-tmp ds [sulfamethoxazole-trimethoprim] and Magnesium-containing compounds  Review of Systems   Review of Systems  Constitutional:  Positive for appetite change and diaphoresis.  HENT: Negative.    Eyes: Negative.   Respiratory:  Positive for shortness of breath. Negative for cough.   Cardiovascular: Negative.   Gastrointestinal: Negative.   Musculoskeletal: Negative.   Skin: Negative.   Neurological:  Positive for syncope, weakness and light-headedness.   Physical Exam Updated Vital Signs BP 123/67   Pulse (!) 58   Temp 97.8 F (36.6 C) (Oral)   Resp 11   Ht 5\' 11"  (1.803 m)   Wt 82 kg   SpO2 100%   BMI 25.21 kg/m   Physical Exam Vitals and nursing note reviewed.  Exam conducted with a chaperone present.  Constitutional:      General: He is not in acute distress.    Appearance: He is not ill-appearing or toxic-appearing.  HENT:     Head: Normocephalic and atraumatic.     Nose: Nose normal. No congestion.     Mouth/Throat:     Mouth: Mucous membranes are moist.     Pharynx: Oropharynx is clear. Uvula midline. No oropharyngeal exudate or posterior oropharyngeal erythema.     Tonsils: No tonsillar exudate.  Eyes:     General: Lids are normal. Vision grossly intact.        Right eye: No discharge.        Left eye: No discharge.     Extraocular Movements: Extraocular movements intact.     Conjunctiva/sclera: Conjunctivae normal.     Pupils: Pupils are equal, round, and reactive to  light.  Neck:     Trachea: Trachea and phonation normal.  Cardiovascular:     Rate and Rhythm: Normal rate and regular rhythm.     Pulses: Normal pulses.     Heart sounds: Normal heart sounds. No murmur heard. Pulmonary:     Effort: Pulmonary effort is normal. No tachypnea, bradypnea, accessory muscle usage, prolonged expiration or respiratory distress.     Breath sounds: Normal breath sounds. No wheezing or rales.  Chest:     Chest wall: No mass, lacerations, deformity, swelling, tenderness, crepitus or edema.  Abdominal:     General: Bowel sounds are normal. There is no distension.     Palpations: Abdomen is soft.     Tenderness: There is no abdominal tenderness. There is no right CVA tenderness, left CVA tenderness, guarding or rebound.  Genitourinary:    Penis: Normal.      Testes: Normal.  Musculoskeletal:        General: No deformity.     Cervical back: Neck supple. No edema, rigidity, tenderness or crepitus. No pain with movement or spinous process tenderness.     Right lower leg: No edema.     Left lower leg: No edema.  Lymphadenopathy:     Cervical: No cervical adenopathy.  Skin:    General: Skin is warm and dry.     Capillary Refill: Capillary refill  takes less than 2 seconds.     Findings: No rash.  Neurological:     General: No focal deficit present.     Mental Status: He is alert and oriented to person, place, and time. Mental status is at baseline.     GCS: GCS eye subscore is 4. GCS verbal subscore is 5. GCS motor subscore is 6.     Cranial Nerves: Cranial nerves are intact.     Sensory: Sensation is intact.     Motor: Motor function is intact.     Coordination: Coordination is intact.     Gait: Gait is intact.  Psychiatric:        Mood and Affect: Mood normal.    ED Results / Procedures / Treatments   Labs (all labs ordered are listed, but only abnormal results are displayed) Labs Reviewed  CBC WITH DIFFERENTIAL/PLATELET - Abnormal; Notable for the following components:      Result Value   WBC 10.7 (*)    RBC 2.45 (*)    Hemoglobin 7.1 (*)    HCT 21.2 (*)    Neutro Abs 7.9 (*)    Abs Immature Granulocytes 0.08 (*)    All other components within normal limits  BASIC METABOLIC PANEL - Abnormal; Notable for the following components:   Sodium 132 (*)    CO2 18 (*)    Glucose, Bld 342 (*)    BUN 49 (*)    Creatinine, Ser 2.97 (*)    Calcium 8.1 (*)    GFR, Estimated 22 (*)    All other components within normal limits  LIPASE, BLOOD - Abnormal; Notable for the following components:   Lipase 83 (*)    All other components within normal limits  HEPATIC FUNCTION PANEL - Abnormal; Notable for the following components:   Albumin 3.4 (*)    All other components within normal limits  URINALYSIS, ROUTINE W REFLEX MICROSCOPIC - Abnormal; Notable for the following components:   APPearance HAZY (*)    Glucose, UA >=500 (*)    Hgb urine dipstick LARGE (*)    Protein, ur 30 (*)  Leukocytes,Ua MODERATE (*)    WBC, UA >50 (*)    Bacteria, UA RARE (*)    All other components within normal limits  CBG MONITORING, ED - Abnormal; Notable for the following components:   Glucose-Capillary 285 (*)    All other components within  normal limits  RESP PANEL BY RT-PCR (FLU A&B, COVID) ARPGX2  URINE CULTURE  TYPE AND SCREEN  ABO/RH  PREPARE RBC (CROSSMATCH)  TROPONIN I (HIGH SENSITIVITY)  TROPONIN I (HIGH SENSITIVITY)    EKG None  Radiology DG Chest 1 View  Result Date: 09/18/2021 CLINICAL DATA:  Syncopal episode EXAM: CHEST  1 VIEW COMPARISON:  Chest radiograph 11/20/2006 FINDINGS: The cardiomediastinal silhouette is normal. There are small nodular opacities in the lateral right lower lobe. There is no focal consolidation or pulmonary edema. There is no pleural effusion or pneumothorax. There is no acute osseous abnormality. IMPRESSION: Small nodular opacities in the lateral right lower lobe could reflect infection in the correct clinical setting. Electronically Signed   By: Valetta Mole M.D.   On: 09/18/2021 09:50   DG Elbow Complete Left  Result Date: 09/18/2021 CLINICAL DATA:  Fall with posterior elbow pain EXAM: LEFT ELBOW - COMPLETE 3+ VIEW COMPARISON:  None. FINDINGS: There is no evidence of fracture, dislocation, or joint effusion. Smaller olecranon spur IMPRESSION: Negative. Electronically Signed   By: Jorje Guild M.D.   On: 09/18/2021 07:07   CT HEAD WO CONTRAST (5MM)  Result Date: 09/18/2021 CLINICAL DATA:  Seizure, nontraumatic (Age >= 35y) Head trauma, minor (Age >= 65y) EXAM: CT HEAD WITHOUT CONTRAST TECHNIQUE: Contiguous axial images were obtained from the base of the skull through the vertex without intravenous contrast. COMPARISON:  None. FINDINGS: Brain: There is no acute intracranial hemorrhage, mass effect, or edema. Gray-white differentiation is preserved. There is no extra-axial fluid collection. Ventricles and sulci are within normal limits in size and configuration. Vascular: There is atherosclerotic calcification at the skull base. Skull: Calvarium is unremarkable. Sinuses/Orbits: No acute finding. Other: None. IMPRESSION: No acute intracranial abnormality. Electronically Signed   By:  Macy Mis M.D.   On: 09/18/2021 10:25   CT Cervical Spine Wo Contrast  Result Date: 09/18/2021 CLINICAL DATA:  Neck trauma (Age >= 65y) EXAM: CT CERVICAL SPINE WITHOUT CONTRAST TECHNIQUE: Multidetector CT imaging of the cervical spine was performed without intravenous contrast. Multiplanar CT image reconstructions were also generated. COMPARISON:  None. FINDINGS: Alignment: No significant listhesis. Skull base and vertebrae: Multilevel degenerative endplate irregularity. No acute fracture. Soft tissues and spinal canal: No prevertebral fluid or swelling. No visible canal hematoma. Disc levels: Multilevel degenerative changes are present including disc space narrowing, endplate osteophytes, and facet and uncovertebral hypertrophy. Fusion of C2-C3 facets. Upper chest: No apical lung mass. Other: Mild calcified plaque at the common carotid bifurcations. IMPRESSION: No acute cervical spine fracture. Electronically Signed   By: Macy Mis M.D.   On: 09/18/2021 10:27    Procedures Procedures   Medications Ordered in ED Medications  0.9 %  sodium chloride infusion (has no administration in time range)  sodium chloride 0.9 % bolus 1,000 mL (1,000 mLs Intravenous New Bag/Given 09/18/21 1357)    ED Course  I have reviewed the triage vital signs and the nursing notes.  Pertinent labs & imaging results that were available during my care of the patient were reviewed by me and considered in my medical decision making (see chart for details).  Clinical Course as of 09/18/21 1430  Tue Sep 18, 2021  [place] [facility] paged; patient follows with Dr. Diamantina Providence. [RS]  1225 Consult to urology, Dr. Diamantina Providence who performed patient's HoLEP.  He agrees with plan for transfusion.  Recommends only treating for UA  if > 50 WBC, nitrite positive, or many bacteria. No emergent urology intervention warranted at this time. He recommends outpatient follow up with him as previously scheduled. I appreciate his  collaboration in the care of this patient.  [RS]  56 Was informed by ED tech that patient became nauseous and short of breath with standing during orthostatic vital signs did become orthostatic with standing. No syncope. I appreciate her collaboration in the care of this patient. [RS]  1428 Consult to family medicine, Dr. Lysbeth Galas, who is agreeable to admitting this patient to her service.  Appreciate her collaboration of care with patient. [RS]    Clinical Course User Index [RS] Sriansh Farra, Sharlene Dory   MDM Rules/Calculators/A&P                         65 year old male presents with syncopal episode.  Recent prostatic procedure with subsequent hematuria resolved at this time.  Differential diagnosis includes but limited to symptomatic anemia, ACS, dysrhythmia, PE, pleural effusion, sepsis, hypovolemia.  Vital signs are normal on intake though BP is borderline low 94/68.  Cardiopulmonary exam is normal, abdominal exam is benign.  Patient is neurologically intact without focal deficit on exam.  Normal GU exam performed with chaperone.  Labs were obtained from triage.  CBC with mild leukocytosis of 10.7.  Hemoglobin decreased to 7.1, previously 10.  BMP with mild hyponatremia of 132 and creatinine near patient's baseline at 2.9.  Lipase very mildly elevated to 83 likely related to episodes of emesis this morning.  Hepatic function panel is normal. Troponin is negative, 12 then 17.  UA pending at this time.  Patient's presentation most consistent with symptomatic anemia with postoperative blood loss in the last couple of weeks.  Case was discussed with surgeon from urology, Dr. Diamantina Providence, who does not feel any emergent urologic procedure is warranted at this time.  Will consider antibiotics pending UA.  Do feel patient would benefit from admission to the hospital for symptomatic anemia.  Extensive discussion with the patient and his wife regarding role of blood transfusion.  They voiced  understanding of the risks and benefits of a blood transfusion expressed wishes to proceed with transfusion at this time.  Case discussed with family medicine as above.  Richard Gill voiced understanding his medical evaluation and treatment plan.  He was questions answered to his expressed satisfaction.  He is amenable for plan to admit at this time.  This chart was dictated using voice recognition software, Dragon. Despite the best efforts of this provider to proofread and correct errors, errors may still occur which can change documentation meaning.   Final Clinical Impression(s) / ED Diagnoses Final diagnoses:  Symptomatic anemia    Rx / DC Orders ED Discharge Orders     None        Aura Dials 09/18/21 1430    Lacretia Leigh, MD 09/21/21 0730

## 2021-09-19 ENCOUNTER — Other Ambulatory Visit: Payer: Self-pay | Admitting: Family Medicine

## 2021-09-19 ENCOUNTER — Encounter (HOSPITAL_COMMUNITY): Payer: Self-pay | Admitting: Family Medicine

## 2021-09-19 DIAGNOSIS — D649 Anemia, unspecified: Secondary | ICD-10-CM

## 2021-09-19 DIAGNOSIS — Z9079 Acquired absence of other genital organ(s): Secondary | ICD-10-CM | POA: Insufficient documentation

## 2021-09-19 DIAGNOSIS — R310 Gross hematuria: Secondary | ICD-10-CM | POA: Insufficient documentation

## 2021-09-19 LAB — TYPE AND SCREEN
ABO/RH(D): A POS
Antibody Screen: NEGATIVE
Unit division: 0

## 2021-09-19 LAB — URINE CULTURE: Culture: NO GROWTH

## 2021-09-19 LAB — BASIC METABOLIC PANEL
Anion gap: 7 (ref 5–15)
BUN: 45 mg/dL — ABNORMAL HIGH (ref 8–23)
CO2: 21 mmol/L — ABNORMAL LOW (ref 22–32)
Calcium: 8.4 mg/dL — ABNORMAL LOW (ref 8.9–10.3)
Chloride: 106 mmol/L (ref 98–111)
Creatinine, Ser: 2.94 mg/dL — ABNORMAL HIGH (ref 0.61–1.24)
GFR, Estimated: 23 mL/min — ABNORMAL LOW (ref >60)
Glucose, Bld: 167 mg/dL — ABNORMAL HIGH (ref 70–99)
Potassium: 4.1 mmol/L (ref 3.5–5.1)
Sodium: 134 mmol/L — ABNORMAL LOW (ref 135–145)

## 2021-09-19 LAB — CBC
HCT: 24.3 % — ABNORMAL LOW (ref 39.0–52.0)
Hemoglobin: 8.2 g/dL — ABNORMAL LOW (ref 13.0–17.0)
MCH: 29 pg (ref 26.0–34.0)
MCHC: 33.7 g/dL (ref 30.0–36.0)
MCV: 85.9 fL (ref 80.0–100.0)
Platelets: 313 10*3/uL (ref 150–400)
RBC: 2.83 MIL/uL — ABNORMAL LOW (ref 4.22–5.81)
RDW: 12.7 % (ref 11.5–15.5)
WBC: 8.8 10*3/uL (ref 4.0–10.5)
nRBC: 0 % (ref 0.0–0.2)

## 2021-09-19 LAB — BPAM RBC
Blood Product Expiration Date: 202210182359
ISSUE DATE / TIME: 202210111621
Unit Type and Rh: 6200

## 2021-09-19 LAB — CBG MONITORING, ED
Glucose-Capillary: 180 mg/dL — ABNORMAL HIGH (ref 70–99)
Glucose-Capillary: 200 mg/dL — ABNORMAL HIGH (ref 70–99)

## 2021-09-19 LAB — HIV ANTIBODY (ROUTINE TESTING W REFLEX): HIV Screen 4th Generation wRfx: NONREACTIVE

## 2021-09-19 LAB — MAGNESIUM: Magnesium: 2.8 mg/dL — ABNORMAL HIGH (ref 1.7–2.4)

## 2021-09-19 NOTE — ED Notes (Signed)
Bladder scan 295 mL. At [place] patient voided 760mL.

## 2021-09-19 NOTE — ED Notes (Signed)
Breakfast order placed ?

## 2021-09-19 NOTE — ED Notes (Signed)
Dr. Owens Shark at bedside

## 2021-09-19 NOTE — ED Notes (Signed)
No foley present on patient during time with this RN. Removed it from existing LDAs since never present.

## 2021-09-19 NOTE — Progress Notes (Signed)
Discharge instructions explained and given to patient and his wife. IV removed. Patient dressed himself and will be wheeled down by nurse tech to meet his wife who will transport him home.

## 2021-09-19 NOTE — Plan of Care (Signed)

## 2021-09-19 NOTE — ED Notes (Addendum)
PT at bedside.

## 2021-09-19 NOTE — TOC Transition Note (Signed)
Transition of Care [facility]) - CM/SW Discharge Note   Patient Details  Name: Richard Gill. MRN: [id number] Date of Birth: Mar 10, 1956  [facility] of [facility] CM/SW Contact:  Tom-Johnson, Renea Ee, RN Phone Number: 09/19/2021, 4:03 PM   Clinical Narrative:    CM spoke with patient at bedside. Wife at his side. Presented to the ED with syncopal episode with loss of consciousness and head trauma. States he lives with his wife. Has eight children, twenty five grand children and seven great grand children. Independent with care prior to hospitalization. 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 Able to drive self and wife drives at times. Has a PCP and Medically Insured with [employer]. Scheduled for discharge. Denies any needs.No recommendations noted. NO further TOC needs.    Final next level of care: Home/Self Care Barriers to Discharge: No Barriers Identified   Patient Goals and CMS Choice Patient states their goals for this hospitalization and ongoing recovery are:: To go home      Discharge Placement                       Discharge Plan and Services                DME Arranged: N/A DME Agency: NA       HH Arranged: NA HH Agency: NA        Social Determinants of Health (SDOH) Interventions     Readmission Risk Interventions No flowsheet data found.

## 2021-09-19 NOTE — Care Management Obs Status (Signed)
[place] NOTIFICATION   Patient Details  Name: Richard Gill. MRN: [id number] Date of Birth: 1956-08-07   Medicare Observation Status Notification Given:  Yes    Tom-Johnson, Renea Ee, RN 09/19/2021, 4:39 PM

## 2021-09-19 NOTE — Discharge Instructions (Signed)
Dear Richard Gill.,   Thank you for letting us participate in your care! In this section, you will find a brief hospital admission summary of why you were admitted to the hospital, what happened during your admission, your diagnosis/diagnoses, and recommended follow up.  You were admitted because you were experiencing syncope (when you pass out).  You were diagnosed with anemia (low blood counts). You were treated with blood transfusion.  Your anemia improved and you were discharged from the hospital for meeting this goal.    POST-HOSPITAL & CARE INSTRUCTIONS Please follow-up with your primary care provider Dr. Chrisandra Netters and your urologist Dr. Marlinda Mike at appointments listed below Please let PCP/Specialists know of any changes in medications that were made.  Please see medications section of this packet for any medication changes.   DOCTOR'S APPOINTMENTS & FOLLOW UP Future Appointments  Date Time Provider [place]  09/21/2021 10:00 AM [facility] [place]  10/04/2021  9:50 AM Leeanne Rio, MD [facility]  12/06/2021  1:30 PM Billey Co, MD [facility] None     Thank you for choosing [facility]! Take care and be well!  [facility]  [place], [place] [id number] [phone]

## 2021-09-19 NOTE — Evaluation (Signed)
Physical Therapy Evaluation Patient Details Name: Richard Gill. MRN: [id number] DOB: 19-Aug-1956 Today's Date: 09/19/2021  History of Present Illness  65 y.o. male presenting from home with syncope with full loss of consciousness and fall onto tile floor.  Pt s/p recent HOLEP prostectomy on 10/3 and has had syncopal symptoms since that time.  Pt admitted with anemia/syncope.  He has received 1 unit PRBC. PMH is significant for BPH, gross hematuria, bladder stones, s/p recent HOLEP prostatectomy, CKD stage 3b, T2DM, HLD, sickle cell trait, smoldering multiple myeloma, and chronic normocytic anemia.  Clinical Impression  Pt admitted with above diagnosis.  During therapy , pt reports feeling much better and back to normal. He had no syncopal symptoms and orthostatic BP were stable.  He was independent with all mobility. Pt does not have further PT need.      Orthostatic BPs  Supine 126/74  Sitting 120/72     Standing 129/91         Recommendations for follow up therapy are one component of a multi-disciplinary discharge planning process, led by the attending physician.  Recommendations may be updated based on patient status, additional functional criteria and insurance authorization.  Follow Up Recommendations No PT follow up    Equipment Recommendations  None recommended by PT    Recommendations for Other Services       Precautions / Restrictions Precautions Precautions: Fall Restrictions Weight Bearing Restrictions: No      Mobility  Bed Mobility Overal bed mobility: Independent                  Transfers Overall transfer level: Independent   Transfers: Sit to/from Stand Sit to Stand: Independent         General transfer comment: Had supervision due to hx of syncope but demonstrated easily and safely  Ambulation/Gait Ambulation/Gait assistance: Independent Gait Distance (Feet): 300 Feet Assistive device: None Gait Pattern/deviations: WFL(Within  Functional Limits)     General Gait Details: Had supervision due to admitted with syncope but demonstrated safely and independently  Stairs            Wheelchair Mobility    Modified Rankin (Stroke Patients Only)       Balance Overall balance assessment: Independent Sitting-balance support: No upper extremity supported Sitting balance-Leahy Scale: Normal     Standing balance support: No upper extremity supported Standing balance-Leahy Scale: Normal               High level balance activites: Direction changes;Turns;Head turns High Level Balance Comments: all without difficulty             Pertinent Vitals/Pain Pain Assessment: No/denies pain    Home Living Family/patient expects to be discharged to:: Private residence Living Arrangements: Spouse/significant other Available Help at Discharge: Family;Available 24 hours/day Type of Home: House Home Access: Stairs to enter Entrance Stairs-Rails:  (post) Entrance Stairs-Number of Steps: 2 Home Layout: One level Home Equipment: Cane - single point      Prior Function Level of Independence: Independent         Comments: Works in [employer], [employer].  Independent in all aspects of ADL/IADL and mobility     Hand Dominance   Dominant Hand: Right    Extremity/Trunk Assessment   Upper Extremity Assessment Upper Extremity Assessment: Defer to OT evaluation    Lower Extremity Assessment Lower Extremity Assessment: LLE deficits/detail;RLE deficits/detail RLE Deficits / Details: ROM WFL; MMT 5/5 LLE Deficits / Details: ROM WFL; MMT 5/5    Cervical /  Trunk Assessment Cervical / Trunk Assessment: Normal  Communication   Communication: No difficulties  Cognition Arousal/Alertness: Awake/alert Behavior During Therapy: WFL for tasks assessed/performed Overall Cognitive Status: Within Functional Limits for tasks assessed                                        General Comments General  comments (skin integrity, edema, etc.): Pt reports feeling back to normal.  Educated on safety with syncopal symptoms including slow transfers , AROM exercises before standing and then weight shifting before walking.  Also, pt had reported he could feel symptoms and tried to hurry back to bed when he fell.  Discussed if feeling symptoms just going ahead and sitting even if on floor    Exercises     Assessment/Plan    PT Assessment [place] does not need any further PT services  PT Problem List         PT Treatment Interventions      PT Goals (Current goals can be found in the Care Plan section)  Acute Rehab PT Goals Patient Stated Goal: Feeling better, I'm ready to go home. PT Goal Formulation: All assessment and education complete, DC therapy    Frequency     Barriers to discharge        Co-evaluation PT/OT/SLP Co-Evaluation/Treatment: Yes Reason for Co-Treatment: Complexity of the patient's impairments (multi-system involvement) (syncope in ED) PT goals addressed during session: Mobility/safety with mobility OT goals addressed during session: ADL's and self-care       AM-PAC PT "6 Clicks" Mobility  Outcome Measure Help needed turning from your back to your side while in a flat bed without using bedrails?: None Help needed moving from lying on your back to sitting on the side of a flat bed without using bedrails?: None Help needed moving to and from a bed to a chair (including a wheelchair)?: None Help needed standing up from a chair using your arms (e.g., wheelchair or bedside chair)?: None Help needed to walk in hospital room?: None Help needed climbing 3-5 steps with a railing? : None 6 Click Score: 24    End of Session Equipment Utilized During Treatment: Gait belt Activity Tolerance: Patient tolerated treatment well Patient left: in bed;with call bell/phone within reach;with family/visitor present Nurse Communication: Mobility status      Time: 1100-1126 PT  Time Calculation (min) (ACUTE ONLY): 26 min   Charges:   PT Evaluation $PT Eval Low Complexity: 1 Low          Ashlee Player, PT Acute Rehab Services Pager [phone] [facility] [phone]   Karlton Lemon 09/19/2021, 1:09 PM

## 2021-09-19 NOTE — ED Notes (Signed)
ED TO INPATIENT HANDOFF REPORT  ED Nurse Name and Phone #: [phone]  S Name/Age/Gender Richard Gill. 65 y.o. male Room/Bed: 046C/046C  Code Status   Code Status: Full Code  Home/SNF/Other Home Patient oriented to: self, place, time, and situation Is this baseline? Yes   Triage Complete: Triage complete  Chief Complaint Syncope [R55]  Triage Note Patient arrived with EMS from home , brief syncopal episode / fell this morning , reports left elbow pain , he received Zofran 4 mg IV and NS 500 ml bolus by EMS prior to arrival . CBG=395 .    Allergies Allergies  Allergen Reactions   Smz-Tmp Ds [Sulfamethoxazole-Trimethoprim] Other (See Comments)    Headache, joint pain fever, thought he was "dying"   Magnesium-Containing Compounds Other (See Comments)    Back Pain    Level of Care/Admitting Diagnosis ED Disposition     ED Disposition  Admit   Condition  --   [place]: [place] [100100]  Level of Care: Telemetry Medical [104]  May place patient in observation at [facility] or [place] if equivalent level of care is available:: No  Covid Evaluation: Asymptomatic Screening Protocol (No Symptoms)  Diagnosis: Syncope [206001]  Admitting Physician: Rickey Primus  Attending Physician: MCDIARMID, TODD D [[id number]]          B Medical/Surgery History Past Medical History:  Diagnosis Date   BPH (benign prostatic hyperplasia)    Chronic kidney disease, stage II (mild)    Dr. 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    Prostatitis    Sickle cell trait ([place])    T2DM (type 2 diabetes mellitus) ([place]) 2005   lantus x1 year   Past Surgical History:  Procedure Laterality Date   BUNIONECTOMY Bilateral 08-25-13   COLONOSCOPY  2014   Dr. Benson [place], was told to f/u in 2017-2019   [place] N/A 09/07/2021    Procedure: CYSTOSCOPY WITH FULGERATION;  Surgeon: Billey Co, MD;  Location: [facility] ORS;  Service: Urology;  Laterality: N/A;   HOLEP-LASER ENUCLEATION OF THE PROSTATE WITH MORCELLATION N/A 09/07/2021   Procedure: HOLEP-LASER ENUCLEATION OF THE PROSTATE WITH MORCELLATION;  Surgeon: Billey Co, MD;  Location: [facility] ORS;  Service: Urology;  Laterality: N/A;   TRANSURETHRAL RESECTION OF PROSTATE N/A 09/06/2013   Procedure: TRANSURETHRAL RESECTION OF THE PROSTATE WITH GYRUS INSTRUMENTS;  Surgeon: [name], MD;  Location: [facility];  Service: Urology;  Laterality: N/A;     A IV Location/Drains/Wounds Patient Lines/Drains/Airways Status     Active Line/Drains/Airways     Name Placement date Placement time Site Days   Peripheral IV 09/18/21 18 G Left Antecubital 09/18/21  1300  Antecubital  1   Incision (Closed) 09/07/21 Perineum Other (Comment) 09/07/21  1229  -- 12   Incision (Closed) 09/07/21 Perineum Other (Comment) 09/07/21  1457  -- 12            Intake/Output Last 24 hours  Intake/Output Summary (Last 24 hours) at 09/19/2021 1208 Last data filed at 09/19/2021 0840 Gross per 24 hour  Intake 999 ml  Output 1100 ml  Net -101 ml    Labs/Imaging Results for orders placed or performed during the hospital encounter of 09/18/21 (from the past 48 hour(s))  CBG monitoring, ED     Status: Abnormal   Collection Time: 09/18/21  6:27  AM  Result Value Ref Range   Glucose-Capillary 285 (H) 70 - 99 mg/dL    Comment: Glucose reference range applies only to samples taken after fasting for at least 8 hours.  CBC with Differential     Status: Abnormal   Collection Time: 09/18/21  6:48 AM  Result Value Ref Range   WBC 10.7 (H) 4.0 - 10.5 K/uL   RBC 2.45 (L) 4.22 - 5.81 MIL/uL   Hemoglobin 7.1 (L) 13.0 - 17.0 g/dL   HCT 21.2 (L) 39.0 - 52.0 %   MCV 86.5 80.0 - 100.0 fL   MCH 29.0 26.0 - 34.0 pg   MCHC 33.5 30.0 - 36.0 g/dL   RDW 12.6 11.5 - 15.5 %   Platelets 332 150 - 400 K/uL    nRBC 0.0 0.0 - 0.2 %   Neutrophils Relative % 73 %   Neutro Abs 7.9 (H) 1.7 - 7.7 K/uL   Lymphocytes Relative 17 %   Lymphs Abs 1.8 0.7 - 4.0 K/uL   Monocytes Relative 5 %   Monocytes Absolute 0.5 0.1 - 1.0 K/uL   Eosinophils Relative 4 %   Eosinophils Absolute 0.4 0.0 - 0.5 K/uL   Basophils Relative 0 %   Basophils Absolute 0.0 0.0 - 0.1 K/uL   Immature Granulocytes 1 %   Abs Immature Granulocytes 0.08 (H) 0.00 - 0.07 K/uL    Comment: Performed at [facility], [address] [place], [place] [address]  Basic metabolic panel     Status: Abnormal   Collection Time: 09/18/21  6:48 AM  Result Value Ref Range   Sodium 132 (L) 135 - 145 mmol/L   Potassium 3.7 3.5 - 5.1 mmol/L   Chloride 102 98 - 111 mmol/L   CO2 18 (L) 22 - 32 mmol/L   Glucose, Bld 342 (H) 70 - 99 mg/dL    Comment: Glucose reference range applies only to samples taken after fasting for at least 8 hours.   BUN 49 (H) 8 - 23 mg/dL   Creatinine, Ser 2.97 (H) 0.61 - 1.24 mg/dL   Calcium 8.1 (L) 8.9 - 10.3 mg/dL   GFR, Estimated 22 (L) >60 mL/min    Comment: (NOTE) Calculated using the CKD-EPI Creatinine Equation (2021)    Anion gap 12 5 - 15    Comment: Performed at [facility] [address] [place], [place] [address]  ABO/Rh     Status: None   Collection Time: 09/18/21  6:48 AM  Result Value Ref Range   ABO/RH(D)      A POS Performed at [facility] [address], [place], [place] [address]   Troponin I (High Sensitivity)     Status: None   Collection Time: 09/18/21  9:00 AM  Result Value Ref Range   Troponin I (High Sensitivity) 12 <18 ng/L    Comment: (NOTE) Elevated high sensitivity troponin I (hsTnI) values and significant  changes across serial measurements may suggest ACS but many other  chronic and acute conditions are known to elevate hsTnI results.  Refer to the Links section for chest pain algorithms and additional  guidance. Performed at [facility], [place]  [address] [place], [place] [address]   Type and screen [place]     Status: None   Collection Time: 09/18/21  9:00 AM  Result Value Ref Range   ABO/RH(D) A POS    Antibody Screen NEG    Sample Expiration 09/21/2021,2359  Unit Number [id number]    Blood Component Type RBC, LR IRR    Unit division 00    Status of Unit ISSUED,FINAL    Transfusion Status OK TO TRANSFUSE    Crossmatch Result      Compatible Performed at [facility], [place] [address], [place], [place] [address]   Lipase, blood     Status: Abnormal   Collection Time: 09/18/21  9:00 AM  Result Value Ref Range   Lipase 83 (H) 11 - 51 U/L    Comment: Performed at [facility] [address], [place], [place] [address]  Hepatic function panel     Status: Abnormal   Collection Time: 09/18/21  9:00 AM  Result Value Ref Range   Total Protein 7.6 6.5 - 8.1 g/dL   Albumin 3.4 (L) 3.5 - 5.0 g/dL   AST 17 15 - 41 U/L   ALT 20 0 - 44 U/L   Alkaline Phosphatase 115 38 - 126 U/L   Total Bilirubin 0.5 0.3 - 1.2 mg/dL   Bilirubin, Direct <0.1 0.0 - 0.2 mg/dL   Indirect Bilirubin NOT CALCULATED 0.3 - 0.9 mg/dL    Comment: Performed at [facility] [address], [place], [place] [address]  Urine Culture     Status: None   Collection Time: 09/18/21 10:26 AM   Specimen: Urine, Clean Catch  Result Value Ref Range   Specimen Description URINE, CLEAN CATCH    Special Requests NONE    Culture      NO GROWTH Performed at [facility], [place] [address] [place], [place] [address]    Report Status 09/19/2021 FINAL   Troponin I (High Sensitivity)     Status: None   Collection Time: 09/18/21 11:14 AM  Result Value Ref Range   Troponin I (High Sensitivity) 17 <18 ng/L    Comment: (NOTE) Elevated high sensitivity troponin I (hsTnI) values and significant  changes across serial measurements may suggest ACS but many other  chronic and acute conditions are known to elevate hsTnI results.  Refer  to the "Links" section for chest pain algorithms and additional  guidance. Performed at [facility], [place] [address], [place], [place] [address]   Prepare RBC (crossmatch)     Status: None   Collection Time: 09/18/21 12:30 PM  Result Value Ref Range   Order Confirmation      ORDERS RECEIVED TO CROSSMATCH Performed at [facility] [address] [place], [place] [address]   Urinalysis, Routine w reflex microscopic Urine, Clean Catch     Status: Abnormal   Collection Time: 09/18/21 12:37 PM  Result Value Ref Range   Color, Urine YELLOW YELLOW   APPearance HAZY (A) CLEAR   Specific Gravity, Urine 1.013 1.005 - 1.030   pH 5.0 5.0 - 8.0   Glucose, UA >=500 (A) NEGATIVE mg/dL   Hgb urine dipstick LARGE (A) NEGATIVE   Bilirubin Urine NEGATIVE NEGATIVE   Ketones, ur NEGATIVE NEGATIVE mg/dL   Protein, ur 30 (A) NEGATIVE mg/dL   Nitrite NEGATIVE NEGATIVE   Leukocytes,Ua MODERATE (A) NEGATIVE   RBC / HPF 11-20 0 - 5 RBC/hpf   WBC, UA >50 (H) 0 - 5 WBC/hpf   Bacteria, UA RARE (A) NONE SEEN    Comment: Performed at [facility] [address] [place], [place] [address]  CBG monitoring, ED     Status: Abnormal   Collection Time: 09/18/21  4:45  PM  Result Value Ref Range   Glucose-Capillary 286 (H) 70 - 99 mg/dL    Comment: Glucose reference range applies only to samples taken after fasting for at least 8 hours.  CBG monitoring, ED     Status: Abnormal   Collection Time: 09/18/21  5:20 PM  Result Value Ref Range   Glucose-Capillary 256 (H) 70 - 99 mg/dL    Comment: Glucose reference range applies only to samples taken after fasting for at least 8 hours.  Resp Panel by RT-PCR (Flu A&B, Covid) Nasopharyngeal Swab     Status: None   Collection Time: 09/18/21  6:00 PM   Specimen: Nasopharyngeal Swab; Nasopharyngeal(NP) swabs in vial transport medium  Result Value Ref Range   SARS Coronavirus 2 by RT PCR NEGATIVE NEGATIVE    Comment: (NOTE) SARS-CoV-2 target nucleic  acids are NOT DETECTED.  The SARS-CoV-2 RNA is generally detectable in upper respiratory specimens during the acute phase of infection. The lowest concentration of SARS-CoV-2 viral copies this assay can detect is 138 copies/mL. A negative result does not preclude SARS-Cov-2 infection and should not be used as the sole basis for treatment or other patient management decisions. A negative result may occur with  improper specimen collection/handling, submission of specimen other than nasopharyngeal swab, presence of viral mutation(s) within the areas targeted by this assay, and inadequate number of viral copies(<138 copies/mL). A negative result must be combined with clinical observations, patient history, and epidemiological information. The expected result is Negative.  Fact Sheet for Patients:  EntrepreneurPulse.com.au  Fact Sheet for Healthcare Providers:  IncredibleEmployment.be  This test is no t yet approved or cleared by the [place] FDA and  has been authorized for detection and/or diagnosis of SARS-CoV-2 by FDA under an Emergency Use Authorization (EUA). This EUA will remain  in effect (meaning this test can be used) for the duration of the COVID-19 declaration under Section 564(b)(1) of the Act, 21 U.S.C.section 360bbb-3(b)(1), unless the authorization is terminated  or revoked sooner.       Influenza A by PCR NEGATIVE NEGATIVE   Influenza B by PCR NEGATIVE NEGATIVE    Comment: (NOTE) The Xpert Xpress SARS-CoV-2/FLU/RSV plus assay is intended as an aid in the diagnosis of influenza from Nasopharyngeal swab specimens and should not be used as a sole basis for treatment. Nasal washings and aspirates are unacceptable for Xpert Xpress SARS-CoV-2/FLU/RSV testing.  Fact Sheet for Patients: EntrepreneurPulse.com.au  Fact Sheet for Healthcare Providers: IncredibleEmployment.be  This test is not  yet approved or cleared by the [place] FDA and has been authorized for detection and/or diagnosis of SARS-CoV-2 by FDA under an Emergency Use Authorization (EUA). This EUA will remain in effect (meaning this test can be used) for the duration of the COVID-19 declaration under Section 564(b)(1) of the Act, 21 U.S.C. section 360bbb-3(b)(1), unless the authorization is terminated or revoked.  Performed at [facility], [place] [address], [place], [place] [address]   HIV Antibody (routine testing w rflx)     Status: None   Collection Time: 09/19/21  5:12 AM  Result Value Ref Range   HIV Screen 4th Generation wRfx Non Reactive Non Reactive    Comment: Performed at [facility], [place] [address], [place] [address]  CBC     Status: Abnormal   Collection Time: 09/19/21  5:12 AM  Result Value Ref Range   WBC 8.8 4.0 - 10.5 K/uL   RBC 2.83 (L) 4.22 - 5.81 MIL/uL  Hemoglobin 8.2 (L) 13.0 - 17.0 g/dL   HCT 24.3 (L) 39.0 - 52.0 %   MCV 85.9 80.0 - 100.0 fL   MCH 29.0 26.0 - 34.0 pg   MCHC 33.7 30.0 - 36.0 g/dL   RDW 12.7 11.5 - 15.5 %   Platelets 313 150 - 400 K/uL   nRBC 0.0 0.0 - 0.2 %    Comment: Performed at [facility] [address], [place], [place] [address]  Basic metabolic panel     Status: Abnormal   Collection Time: 09/19/21  5:12 AM  Result Value Ref Range   Sodium 134 (L) 135 - 145 mmol/L   Potassium 4.1 3.5 - 5.1 mmol/L   Chloride 106 98 - 111 mmol/L   CO2 21 (L) 22 - 32 mmol/L   Glucose, Bld 167 (H) 70 - 99 mg/dL    Comment: Glucose reference range applies only to samples taken after fasting for at least 8 hours.   BUN 45 (H) 8 - 23 mg/dL   Creatinine, Ser 2.94 (H) 0.61 - 1.24 mg/dL   Calcium 8.4 (L) 8.9 - 10.3 mg/dL   GFR, Estimated 23 (L) >60 mL/min    Comment: (NOTE) Calculated using the CKD-EPI Creatinine Equation (2021)    Anion gap 7 5 - 15    Comment: Performed at [facility] [address], [place], [place] [address]   Magnesium     Status: Abnormal   Collection Time: 09/19/21  5:12 AM  Result Value Ref Range   Magnesium 2.8 (H) 1.7 - 2.4 mg/dL    Comment: Performed at [facility] [address] [place], [place] [address]  CBG monitoring, ED     Status: Abnormal   Collection Time: 09/19/21  8:45 AM  Result Value Ref Range   Glucose-Capillary 180 (H) 70 - 99 mg/dL    Comment: Glucose reference range applies only to samples taken after fasting for at least 8 hours.  CBG monitoring, ED     Status: Abnormal   Collection Time: 09/19/21 11:46 AM  Result Value Ref Range   Glucose-Capillary 200 (H) 70 - 99 mg/dL    Comment: Glucose reference range applies only to samples taken after fasting for at least 8 hours.   DG Chest 1 View  Result Date: 09/18/2021 CLINICAL DATA:  Syncopal episode EXAM: CHEST  1 VIEW COMPARISON:  Chest radiograph 11/20/2006 FINDINGS: The cardiomediastinal silhouette is normal. There are small nodular opacities in the lateral right lower lobe. There is no focal consolidation or pulmonary edema. There is no pleural effusion or pneumothorax. There is no acute osseous abnormality. IMPRESSION: Small nodular opacities in the lateral right lower lobe could reflect infection in the correct clinical setting. Electronically Signed   By: Valetta Mole M.D.   On: 09/18/2021 09:50   DG Elbow Complete Left  Result Date: 09/18/2021 CLINICAL DATA:  Fall with posterior elbow pain EXAM: LEFT ELBOW - COMPLETE 3+ VIEW COMPARISON:  None. FINDINGS: There is no evidence of fracture, dislocation, or joint effusion. Smaller olecranon spur IMPRESSION: Negative. Electronically Signed   By: Jorje Guild M.D.   On: 09/18/2021 07:07   CT HEAD WO CONTRAST (5MM)  Result Date: 09/18/2021 CLINICAL DATA:  Seizure, nontraumatic (Age >= 30y) Head trauma, minor (Age >= 65y) EXAM: CT HEAD WITHOUT CONTRAST TECHNIQUE: Contiguous axial images were obtained from the base of the skull through the vertex without  intravenous contrast. COMPARISON:  None. FINDINGS: Brain: There is no  acute intracranial hemorrhage, mass effect, or edema. Gray-white differentiation is preserved. There is no extra-axial fluid collection. Ventricles and sulci are within normal limits in size and configuration. Vascular: There is atherosclerotic calcification at the skull base. Skull: Calvarium is unremarkable. Sinuses/Orbits: No acute finding. Other: None. IMPRESSION: No acute intracranial abnormality. Electronically Signed   By: Macy Mis M.D.   On: 09/18/2021 10:25   CT Cervical Spine Wo Contrast  Result Date: 09/18/2021 CLINICAL DATA:  Neck trauma (Age >= 65y) EXAM: CT CERVICAL SPINE WITHOUT CONTRAST TECHNIQUE: Multidetector CT imaging of the cervical spine was performed without intravenous contrast. Multiplanar CT image reconstructions were also generated. COMPARISON:  None. FINDINGS: Alignment: No significant listhesis. Skull base and vertebrae: Multilevel degenerative endplate irregularity. No acute fracture. Soft tissues and spinal canal: No prevertebral fluid or swelling. No visible canal hematoma. Disc levels: Multilevel degenerative changes are present including disc space narrowing, endplate osteophytes, and facet and uncovertebral hypertrophy. Fusion of C2-C3 facets. Upper chest: No apical lung mass. Other: Mild calcified plaque at the common carotid bifurcations. IMPRESSION: No acute cervical spine fracture. Electronically Signed   By: Macy Mis M.D.   On: 09/18/2021 10:27    Pending Labs Unresulted Labs (From admission, onward)    None       Vitals/Pain Today's Vitals   09/19/21 0500 09/19/21 0815 09/19/21 0840 09/19/21 0841  BP: 119/72  111/75   Pulse: (!) 54 (!) 54 64   Resp: 13 14 17    Temp:      TempSrc:      SpO2: 100% 100% 100%   Weight:      Height:      PainSc:    0-No pain    Isolation Precautions No active isolations  Medications Medications  rosuvastatin (CRESTOR) tablet 40 mg  (40 mg Oral Given 09/18/21 1649)  ondansetron (ZOFRAN-ODT) disintegrating tablet 4 mg (has no administration in time range)  acetaminophen (TYLENOL) tablet 650 mg (has no administration in time range)    Or  acetaminophen (TYLENOL) suppository 650 mg (has no administration in time range)  insulin aspart (novoLOG) injection 0-9 Units (2 Units Subcutaneous Given 09/19/21 0850)  insulin glargine-yfgn (SEMGLEE) injection 12 Units (12 Units Subcutaneous Given 09/18/21 1649)  empagliflozin (JARDIANCE) tablet 10 mg (has no administration in time range)  sodium chloride 0.9 % bolus 1,000 mL (0 mLs Intravenous Stopped 09/18/21 1719)  0.9 %  sodium chloride infusion (0 mL/hr Intravenous Stopped 09/19/21 0345)    Mobility  Moderate fall risk   Focused Assessments    R Recommendations: See Admitting Provider Note  Report given to:   Additional Notes:

## 2021-09-19 NOTE — Discharge Summary (Addendum)
[facility] Discharge Summary  Patient name: Richard Gill. Medical record number: [id number] Date of birth: Apr 13, 1956 Age: 66 y.o. Gender: male Date of Admission: 09/18/2021  Date of Discharge: 09/19/2021 Admitting Physician: Blane Ohara McDiarmid, MD  Primary Care Provider: Leeanne Rio, MD Consultants: None  Indication for Hospitalization: Syncope  Discharge Diagnoses/Problem List:  Active Problems:   Syncope   Disposition: Home  Discharge Condition: Stable  Discharge Exam:  General: Frail, tired-appearing, laying in bed, using multiple blankets ENTM: Mucous membranes moist, tongue midline Eyes: Conjunctical pallor. PERRLA Cardiovascular: RRR, no murmurs Respiratory: CTAB anteriorly Abdomen: Soft, nondistended, nontender Extremities: Warm, dry, well-perfused    Brief Hospital Course:  Richard, Gill. is a 66 year old male who was admitted for syncope.  His past medical history significant for BPH, gross hematuria, bladder stones, s/p recent HoLEP prostatectomy, CKD 3 stage IIIb, T2DM, HLD, sickle cell trait, smoldering multiple myeloma, and chronic normocytic anemia.  Hospital course is outlined below:  Syncopal episode with loss of consciousness and head trauma Patient presented on 10/11 after syncope at home in which he had loss of consciousness for around 2 minutes and hit his head and elbow as he collapsed.  It was witnessed by his wife.  Vital signs were stable upon admission.  Orthostatic vital signs were positive.  Labs remarkable for WBC 10.7, hemoglobin 7.1, Na+ 132, K+ 3.7, glucose 342, creatinine 2.97, BUN 49, elevated lipase to 83. Urinalysis revealed >500 glucose, moderate leukocytes, >50 WBC, rare bacteria.  Imaging including left elbow x-ray, CT head, CT cervical spine were within normal limits.  Chest x-ray showed no focal consolidation or pulmonary edema, but did have small nodular opacities in the lateral right lower lobe.   EKG was reassuring, troponin negative x2.  Syncopal episode was thought to be likely secondary to poor p.o. intake following surgery, and symptomatic anemia with hematuria.  There was concern for ACS or MI, or CVA.  At time of discharge, patient denied dizziness, weakness and he was hemodynamically stable.  Symptomatic anemia secondary to blood loss from hematuria following prostate surgery Hemoglobin on admission 7.1.  Unsure of baseline, but last hemoglobin on 07/25/2021 was 10.9.  He was symptomatic with significant fatigue, dizziness.  Received 1 unit PRBC and posttransfusion H/H with hemoglobin of 8.2 on day of discharge.  Type 2 diabetes mellitus CBG elevated on admission to 370.  CBG well controlled during hospital stay.  CKD stage III Serum creatinine 2.97 on admission, near baseline of 2.5.  Follow-up items for PCP 1.  Repeat hemoglobin on 09/21/2021 at lab appointment at [facility] center. 2.  Repeat orthostatic vitals at next office visit. 3.  Follow-up with urology recommendations/management at next appointment on 12/06/2021.     Significant Labs and Imaging:  Recent Labs  Lab 09/18/21 0648 09/19/21 0512  WBC 10.7* 8.8  HGB 7.1* 8.2*  HCT 21.2* 24.3*  PLT 332 313   Recent Labs  Lab 09/18/21 0648 09/18/21 0900 09/19/21 0512  NA 132*  --  134*  K 3.7  --  4.1  CL 102  --  106  CO2 18*  --  21*  GLUCOSE 342*  --  167*  BUN 49*  --  45*  CREATININE 2.97*  --  2.94*  CALCIUM 8.1*  --  8.4*  MG  --   --  2.8*  ALKPHOS  --  115  --   AST  --  17  --   ALT  --  20  --   ALBUMIN  --  3.4*  --     Results/Tests Pending at Time of Discharge: None  Discharge Medications:  Allergies as of 09/19/2021       Reactions   Smz-tmp Ds [sulfamethoxazole-trimethoprim] Other (See Comments)   Headache, joint pain fever, thought he was "dying"   Magnesium-containing Compounds Other (See Comments)   Back Pain        Medication List     STOP taking these medications     aspirin EC 81 MG tablet   hydrOXYzine 25 MG tablet Commonly known as: ATARAX/VISTARIL   ondansetron 4 MG disintegrating tablet Commonly known as: Zofran ODT       TAKE these medications    accu-chek soft touch lancets Use as instructed   Basaglar KwikPen 100 UNIT/ML Inject 25 Units into the skin daily. What changed: how much to take   empagliflozin 10 MG Tabs tablet Commonly known as: JARDIANCE Take 1 tablet (10 mg total) by mouth daily.   glucose blood test strip Commonly known as: RELION GLUCOSE TEST STRIPS Check sugar 3 times daily   ReliOn Pen Needles 31G X 6 MM Misc Generic drug: Insulin Pen Needle USE 1  ONCE DAILY   rosuvastatin 40 MG tablet Commonly known as: CRESTOR Take 1 tablet by mouth once daily        Discharge Instructions: Please refer to Patient Instructions section of EMR for full details.  Patient was counseled important signs and symptoms that should prompt return to medical care, changes in medications, dietary instructions, activity restrictions, and follow up appointments.   Follow-Up Appointments:  Follow-up Information     Leeanne Rio, MD. Go on 10/04/2021.   Specialty: Family Medicine Why: Appointment at 950 am.  PLease arrive 15 mins before scheduled time Contact information: [place] [address] [phone]         Terrilyn Saver, MD .   Specialty: Internal Medicine Contact information: [id number] [facility] [place] [address] [phone]         Billey Co, MD Follow up on 12/06/2021.   Specialty: Urology Why: Appointment at 130 pm.  Please arrive 15 mins before scheduled time. Contact information: [place] [address] [place] Follow up on 09/21/2021.   Why: Appointment for lab draw at 10 am. Contact information: [place]                Orvis Brill, DO 09/19/2021, 3:12 PM PGY-1, [facility] Upper-Level Resident Addendum   I have independently interviewed and examined the patient. I have discussed the above with the original author and agree with their documentation.  Please see also any attending notes.    Carollee Leitz, MD PGY-3, [facility] 09/19/2021 3:50 PM  FPTS Service pager: [phone] (text pages welcome through [facility])

## 2021-09-19 NOTE — Evaluation (Signed)
Occupational Therapy Evaluation Patient Details Name: Richard Gill. MRN: [id number] DOB: [birth year] Today's Date: 09/19/2021   History of Present Illness 65 y.o. male presenting from home with syncope. PMH is significant for BPH, gross hematuria, bladder stones, s/p recent HOLEP prostatectomy, CKD stage 3b, T2DM, HLD, sickle cell trait, smoldering multiple myeloma, and chronic normocytic anemia.   Clinical Impression   Patient admitted for the diagnosis above, PTA he works full time, and needs no assist with ADL/IADL, or mobility.  Orthostatics taken and charted by PT.  Patient is feeling more like himself,a nd is at, or near, his baseline for ADL completion and in room mobility.  No acute or post acute OT needs identified.  Recommend home when when cleared medically.        Recommendations for follow up therapy are one component of a multi-disciplinary discharge planning process, led by the attending physician.  Recommendations may be updated based on patient status, additional functional criteria and insurance authorization.   Follow Up Recommendations  No OT follow up    Equipment Recommendations  None recommended by OT    Recommendations for Other Services       Precautions / Restrictions Precautions Precautions: Fall Restrictions Weight Bearing Restrictions: No      Mobility Bed Mobility Overal bed mobility: Independent                  Transfers Overall transfer level: Independent                    Balance Overall balance assessment: No apparent balance deficits (not formally assessed)                                         ADL either performed or assessed with clinical judgement   ADL Overall ADL's : At baseline                                             Vision Baseline Vision/History: 1 Wears glasses Patient Visual Report: No change from baseline       Perception  NT   Praxis  NT    Pertinent  Vitals/Pain Pain Assessment: No/denies pain     Hand Dominance Right   Extremity/Trunk Assessment Upper Extremity Assessment Upper Extremity Assessment: Overall WFL for tasks assessed   Lower Extremity Assessment Lower Extremity Assessment: Defer to PT evaluation   Cervical / Trunk Assessment Cervical / Trunk Assessment: Normal   Communication Communication Communication: No difficulties   Cognition Arousal/Alertness: Awake/alert Behavior During Therapy: WFL for tasks assessed/performed Overall Cognitive Status: Within Functional Limits for tasks assessed                                     General Comments   VSS    Exercises     Shoulder Instructions      Home Living Family/patient expects to be discharged to:: Private residence Living Arrangements: Spouse/significant other Available Help at Discharge: Family;Available 24 hours/day Type of Home: House Home Access: Stairs to enter [employer] of Steps: 2 Entrance Stairs-Rails:  (post) Home Layout: One level     Bathroom Shower/Tub: Teacher, early years/pre: Handicapped height Bathroom  Accessibility: Yes   Home Equipment: Cane - single point          Prior Functioning/Environment Level of Independence: Independent        Comments: Works in Administrator, Civil Service.  Independent in all aspects of ADL/IADL and mobility        OT Problem List: Impaired balance (sitting and/or standing)      OT Treatment/Interventions:      OT Goals(Current goals can be found in the care plan section) Acute Rehab OT Goals Patient Stated Goal: Feeling better, I'm ready to go home. OT Goal Formulation: With patient Time For Goal Achievement: 09/19/21 Potential to Achieve Goals: Good  OT Frequency:     Barriers to D/C:            Co-evaluation PT/OT/SLP Co-Evaluation/Treatment: Yes Reason for Co-Treatment: Complexity of the patient's impairments (multi-system involvement);Other (comment)  (syncope and in ED)   OT goals addressed during session: ADL's and self-care      AM-PAC OT "6 Clicks" Daily Activity     Outcome Measure Help from another person eating meals?: None Help from another person taking care of personal grooming?: None Help from another person toileting, which includes using toliet, bedpan, or urinal?: None Help from another person bathing (including washing, rinsing, drying)?: None Help from another person to put on and taking off regular upper body clothing?: None Help from another person to put on and taking off regular lower body clothing?: None 6 Click Score: 24   End of Session Equipment Utilized During Treatment: Gait belt  Activity Tolerance: Patient tolerated treatment well Patient left: in bed;with call bell/phone within reach;with family/visitor present  OT Visit Diagnosis: Unsteadiness on feet (R26.81)                Time: 1610-9604 OT Time Calculation (min): 11 min Charges:  OT General Charges $OT Visit: 1 Visit OT Evaluation $OT Eval Moderate Complexity: 1 Mod  09/19/2021  RP, OTR/L  Acute Rehabilitation Services  Office:  [phone]   Metta Clines 09/19/2021, 11:34 AM

## 2021-09-19 NOTE — Hospital Course (Signed)
Richard Gill, Willis. is a 65 year old male who was admitted for syncope.  His past medical history significant for BPH, gross hematuria, bladder stones, s/p recent HoLEP prostatectomy, CKD 3 stage IIIb, T2DM, HLD, sickle cell trait, smoldering multiple myeloma, and chronic normocytic anemia.  Hospital course is outlined below:  Syncopal episode with loss of consciousness and head trauma Patient presented on 10/11 after syncope at home in which he had loss of consciousness for around 2 minutes and hit his head and elbow as he collapsed.  It was witnessed by his wife.  Vital signs were stable upon admission.  Orthostatic vital signs were positive.  Labs remarkable for WBC 10.7, hemoglobin 7.1, Na+ 132, K+ 3.7, glucose 342, creatinine 2.97, BUN 49, elevated lipase to 83. Urinalysis revealed >500 glucose, moderate leukocytes, >50 WBC, rare bacteria.  Imaging including left elbow x-ray, CT head, CT cervical spine were within normal limits.  Chest x-ray showed no focal consolidation or pulmonary edema, but did have small nodular opacities in the lateral right lower lobe.  EKG was reassuring, troponin negative x2.  Syncopal episode was thought to be likely secondary to poor p.o. intake following surgery, and symptomatic anemia with hematuria.  There was concern for ACS or MI, or CVA.  At time of discharge, patient denied dizziness, weakness and he was hemodynamically stable.  Symptomatic anemia secondary to blood loss from hematuria following prostate surgery Hemoglobin on admission 7.1.  Unsure of baseline, but last hemoglobin on 07/25/2021 was 10.9.  He was symptomatic with significant fatigue, dizziness.  Received 1 unit PRBC and posttransfusion H/H with hemoglobin of 8.2 on day of discharge.  Type 2 diabetes mellitus CBG elevated on admission to 370.  CBG well controlled during hospital stay.  CKD stage III Serum creatinine 2.97 on admission, near baseline of 2.5.  Follow-up items for PCP 1.  Repeat  hemoglobin on 09/21/2021 at lab appointment at [facility] center. 2.  Repeat orthostatic vitals at next office visit. 3.  Follow-up with urology recommendations/management at next appointment on 12/06/2021.

## 2021-09-19 NOTE — Progress Notes (Signed)
Family Medicine Teaching Service Daily Progress Note Intern Pager: [phone]  Patient name: Richard Gill. Medical record number: [id number] Date of birth: [birth year] Age: 65 y.o. Gender: male  Primary Care Provider: Leeanne Rio, MD Consultants: None Code Status: Full  Pt Overview and Major Events to Date:  10/11- Admitted, 1U PRBC transfused  10/12- Medically stable for discharge  Assessment and Plan: Richard Gill. is a 65 y.o. male presenting from home with syncope. PMH is significant for BPH, gross hematuria, bladder stones, s/p recent HOLEP prostatectomy, CKD stage 3b, T2DM, HLD, sickle cell trait, smoldering multiple myeloma, and chronic normocytic anemia.   Syncopal episode 2/2 symptomatic anemia due to blood loss form hematuria following prostate surgery Patient received 1 unit PRBC yesterday.  Posttransfusion H&H revealed hemoglobin 8.2, improved from 7.1.  He reports feeling significantly improved.  Unremarkable this morning.  WBC 8.8, decreased from 10.7 yesterday.  This morning, I will discuss with the patient's urologist regarding further recommendations and to find out more information about blood loss from procedure 2 weeks ago.  I would also like to see if a sooner follow-up appointment can be scheduled, as his postop appointment is 3 months from now.  It remains unlikely that the syncopal episode was cardiac related as patient continues to saturate well on room air, denies chest pain and echocardiogram in 06/2021 revealed ejection fraction 62% with normal left ventricular function and no regional wall motion abnormalities. -Vital signs per unit floor protocol -Up with assistance -Repeat orthostatic vitals -Continuous cardiac monitoring -PT/OT eval and treat -Tylenol every 6 hours as needed for pain or fever    Acute on chronic normocytic anemia, s/p 1U PRBC Hemoglobin 8.2 this morning.  He reports feeling significantly better after transfusion yesterday.  Denies  any dizziness, weakness, nausea, vomiting. -Follow-up outpatient with [facility] center for Hgb check next few days   Type 2 Diabetes CBGs without hypoglycemia overnight.  CBG 180 this morning, overnight 256, 286.  Home meds: Jardiance 78m daily, glargine 25u daily (took 30 last night b/c glucometer read HIGH) -CBG monitoring qAC and qHS -Semglee 12 units every morning -Sensitive SSI coverage -Continue home Jardiance   Hypertension Chronic, stable.  Most recent BP 111/75 -Monitor blood pressure with vital signs   CKD Stage 3 Serum creatinine 2.97 on admission.  Creatinine 2.94 this morning. -Monitor renal function with daily labs   HLD Chronic and stable.  Home medication: Rosuvastatin 470mdaily -Continue home medication   FEN/GI: Carb modified PPx: None, high bleeding risk Dispo:Home today. Barriers include none.   Subjective:  [name] well today, especially since the 1u pRBC transfusion. He has no complaints.  Objective: Temp:  [97.9 F (36.6 C)-98.2 F (36.8 C)] 98.2 F (36.8 C) (10/11 2003) Pulse Rate:  [51-89] 64 (10/12 0840) Resp:  [11-24] 17 (10/12 0840) BP: (105-152)/(56-123) 111/75 (10/12 0840) SpO2:  [98 %-100 %] 100 % (10/12 0840) Physical Exam: General: Frail, tired-appearing, laying in bed Eyes: Conjunctical pallor. PERRLA Cardiovascular: RRR, no murmurs Respiratory: CTAB anteriorly Abdomen: Soft, nondistended, nontender Extremities: Warm, dry, well-perfused  Laboratory: Recent Labs  Lab 09/18/21 0648 09/19/21 0512  WBC 10.7* 8.8  HGB 7.1* 8.2*  HCT 21.2* 24.3*  PLT 332 313   Recent Labs  Lab 09/18/21 0648 09/18/21 0900 09/19/21 0512  NA 132*  --  134*  K 3.7  --  4.1  CL 102  --  106  CO2 18*  --  21*  BUN 49*  --  45*  CREATININE 2.97*  --  2.94*  CALCIUM 8.1*  --  8.4*  PROT  --  7.6  --   BILITOT  --  0.5  --   ALKPHOS  --  115  --   ALT  --  20  --   AST  --  17  --   GLUCOSE 342*  --  167*    Imaging/Diagnostic  Tests: No results found.   Orvis Brill, DO 09/19/2021, 9:06 AM PGY-1, [place] Intern pager: [phone], text pages welcome

## 2021-09-21 ENCOUNTER — Other Ambulatory Visit: Payer: Self-pay

## 2021-09-21 ENCOUNTER — Other Ambulatory Visit: Payer: Medicare HMO

## 2021-09-21 DIAGNOSIS — D649 Anemia, unspecified: Secondary | ICD-10-CM

## 2021-09-22 LAB — CBC
Hematocrit: 28.1 % — ABNORMAL LOW (ref 37.5–51.0)
Hemoglobin: 8.9 g/dL — ABNORMAL LOW (ref 13.0–17.7)
MCH: 28.3 pg (ref 26.6–33.0)
MCHC: 31.7 g/dL (ref 31.5–35.7)
MCV: 90 fL (ref 79–97)
Platelets: 366 10*3/uL (ref 150–450)
RBC: 3.14 x10E6/uL — ABNORMAL LOW (ref 4.14–5.80)
RDW: 14 % (ref 11.6–15.4)
WBC: 8.4 10*3/uL (ref 3.4–10.8)

## 2021-09-27 ENCOUNTER — Telehealth: Payer: Self-pay

## 2021-09-27 NOTE — Telephone Encounter (Signed)
Patients wife calls nurse line requesting a letter from PCP for her employer. Wife reports she needs the letter to state she is his sole caregiver at this time. Dates need to state 09/25/2021-10/07/2021. Please advise.

## 2021-09-28 ENCOUNTER — Encounter: Payer: Self-pay | Admitting: Family Medicine

## 2021-09-28 NOTE — Telephone Encounter (Signed)
Spoke with Richard Gill ([place] confirmed) Reports Richard Gill is still having some nausea but his appetite is improving She is still nervous to leave him home by himself and feels she is needed at home to care for him Anticipates going to work after 10/30. Already had letter excusing her through 10/18 after his surgery.  Letter written asking she be excused from work during these dates 10/18-10/30. Will place at front desk for her to pick up. She is aware she can pick up today and is appreciative.  Leeanne Rio, MD

## 2021-10-04 ENCOUNTER — Other Ambulatory Visit: Payer: Self-pay

## 2021-10-04 ENCOUNTER — Ambulatory Visit (INDEPENDENT_AMBULATORY_CARE_PROVIDER_SITE_OTHER): Payer: Medicare HMO | Admitting: Family Medicine

## 2021-10-04 ENCOUNTER — Encounter: Payer: Self-pay | Admitting: Family Medicine

## 2021-10-04 ENCOUNTER — Ambulatory Visit (INDEPENDENT_AMBULATORY_CARE_PROVIDER_SITE_OTHER): Payer: Medicare HMO

## 2021-10-04 VITALS — BP 122/76 | HR 62 | Temp 98.5°F | Ht 71.0 in | Wt 149.0 lb

## 2021-10-04 DIAGNOSIS — Z23 Encounter for immunization: Secondary | ICD-10-CM | POA: Diagnosis not present

## 2021-10-04 DIAGNOSIS — R55 Syncope and collapse: Secondary | ICD-10-CM | POA: Diagnosis not present

## 2021-10-04 DIAGNOSIS — N182 Chronic kidney disease, stage 2 (mild): Secondary | ICD-10-CM

## 2021-10-04 DIAGNOSIS — E119 Type 2 diabetes mellitus without complications: Secondary | ICD-10-CM | POA: Diagnosis not present

## 2021-10-04 DIAGNOSIS — D649 Anemia, unspecified: Secondary | ICD-10-CM

## 2021-10-04 DIAGNOSIS — E785 Hyperlipidemia, unspecified: Secondary | ICD-10-CM

## 2021-10-04 DIAGNOSIS — Z794 Long term (current) use of insulin: Secondary | ICD-10-CM

## 2021-10-04 MED ORDER — ROSUVASTATIN CALCIUM 10 MG PO TABS: 10.0000 mg | ORAL_TABLET | Freq: Every day | ORAL | 3 refills | Status: DC

## 2021-10-04 NOTE — Assessment & Plan Note (Addendum)
Patient's current dose of Crestor at 40 mg will need to be decreased given kidney function with GFR of 23. New prescription for 10 mg sent in. Will also get updated BMP today to assess kidney function.

## 2021-10-04 NOTE — Assessment & Plan Note (Addendum)
Patient overall improving well gradually with increasing strength, energy, and appetite without frank bleeding or syncopal episodes. Discussed continuing gradual changes. Dizziness solely near the end of urination could be of vasovagal origin after prostatectomy. Counseled on sitting down when urinating to decrease risk of falls. Patient can also follow up with urologist. If symptoms persist in 1 month, will discuss further workup at diabetes checkup. Check renal function and CBC today.

## 2021-10-04 NOTE — Progress Notes (Addendum)
error 

## 2021-10-04 NOTE — Assessment & Plan Note (Addendum)
Given patient's fasting glucoses in the 300s in the absence of medication or dietary changes, will need to alter medication regimen. Recent stressors of hospitalization could be influencing blood sugars as well as decreased activity due to his condition. Will start with 2 unit increase to basaglar each day until fasting BG is around 120. Discussed using caution when becoming more active with increases in insulin to prevent hypoglycemic episodes. Will follow up in 1 month to discuss improvement.

## 2021-10-04 NOTE — Patient Instructions (Signed)
Changing dose of crestor to 10mg  daily  Checking kidneys and blood counts today  For sugars - go up by 2 units of basaglar each day your fasting sugar is >120.  Tomorrow morning, start with 32 units. The next day, if sugar >120 in AM, give yourself 34 units.  Follow up in 1 month for diabetes, sooner if needed  Be well, Dr. Ardelia Mems

## 2021-10-05 ENCOUNTER — Telehealth: Payer: Self-pay | Admitting: Family Medicine

## 2021-10-05 ENCOUNTER — Other Ambulatory Visit: Payer: Medicare HMO

## 2021-10-05 DIAGNOSIS — D649 Anemia, unspecified: Secondary | ICD-10-CM

## 2021-10-05 LAB — CMP14+EGFR
ALT: 15 IU/L (ref 0–44)
AST: 14 IU/L (ref 0–40)
Albumin/Globulin Ratio: 1.5 (ref 1.2–2.2)
Albumin: 4.4 g/dL (ref 3.8–4.8)
Alkaline Phosphatase: 182 IU/L — ABNORMAL HIGH (ref 44–121)
BUN/Creatinine Ratio: 17 (ref 10–24)
BUN: 45 mg/dL — ABNORMAL HIGH (ref 8–27)
Bilirubin Total: 0.3 mg/dL (ref 0.0–1.2)
CO2: 19 mmol/L — ABNORMAL LOW (ref 20–29)
Calcium: 9 mg/dL (ref 8.6–10.2)
Chloride: 104 mmol/L (ref 96–106)
Creatinine, Ser: 2.66 mg/dL — ABNORMAL HIGH (ref 0.76–1.27)
Globulin, Total: 3 g/dL (ref 1.5–4.5)
Glucose: 298 mg/dL — ABNORMAL HIGH (ref 70–99)
Potassium: 4.7 mmol/L (ref 3.5–5.2)
Sodium: 136 mmol/L (ref 134–144)
Total Protein: 7.4 g/dL (ref 6.0–8.5)
eGFR: 26 mL/min/{1.73_m2} — ABNORMAL LOW (ref >59)

## 2021-10-05 NOTE — Telephone Encounter (Signed)
Called patient to review labs.  Renal function improved from discharge Alk phos up - will plan to recheck in 1 month Cbc unfortunately not drawn - apologized to patient for this. He will come in at 11a for repeat lab draw to get cbc  Patient appreciative Leeanne Rio, MD

## 2021-10-06 LAB — CBC
Hematocrit: 26 % — ABNORMAL LOW (ref 37.5–51.0)
Hemoglobin: 8.6 g/dL — ABNORMAL LOW (ref 13.0–17.7)
MCH: 27.4 pg (ref 26.6–33.0)
MCHC: 33.1 g/dL (ref 31.5–35.7)
MCV: 83 fL (ref 79–97)
Platelets: 209 10*3/uL (ref 150–450)
RBC: 3.14 x10E6/uL — ABNORMAL LOW (ref 4.14–5.80)
RDW: 14.3 % (ref 11.6–15.4)
WBC: 5.1 10*3/uL (ref 3.4–10.8)

## 2021-10-14 NOTE — Assessment & Plan Note (Signed)
Renally adjusting crestor down to 10mg  daily, new rx sent in

## 2021-10-14 NOTE — Progress Notes (Signed)
    SUBJECTIVE:   CHIEF COMPLAINT / HPI:   Richard Gill (MRN: [id number]) is a 65 y.o. male with a history of HTN, T2DM, BPH s/p prostatectomy, CKD stage II, and HLD who presents with his wife Richard Gill for hospital follow up.  Syncope Patient states that since discharge from the hospital on 10/12 for syncopal episode after acute blood loss s/p prostatectomy, he has been improving slowly. He feels he is gradually getting his strength, appetite, and energy back. He does mention some dizziness right at his bladder is about to fully empty, but he has not had any syncopal episodes. He has not felt dizzy when standing from a seated position. He denies any known blood loss in urine, stool, or other sites.   T2DM Since leaving the hospital, he has noticed blood sugars in the 300s when waking in the morning. He has been taking all of his medications as prescribed, and his diet has not changed. He has been working less than usual because of the hospitalization, and he feels this is the reason why his blood sugars have been so high.  OBJECTIVE:   BP 122/76   Pulse 62   Temp 98.5 F (36.9 C)   Ht 5\' 11"  (1.803 m)   Wt 149 lb (67.6 kg)   SpO2 100%   BMI 20.78 kg/m    PHYSICAL EXAM  GEN: Well-developed, in NAD HEAD: NCAT CVS: RRR, normal S1/S2, no murmurs, rubs, gallops, no lower extremity edema RESP: Breathing comfortably on RA, no retractions, wheezes, rhonchi, or crackles ABD: Non-distended EXT: Moves all extremities grossly equally    ASSESSMENT/PLAN:   T2DM (type 2 diabetes mellitus) (HCC) Given patient's fasting glucoses in the 300s in the absence of medication or dietary changes, will need to alter medication regimen. Recent stressors of hospitalization could be influencing blood sugars as well as decreased activity due to his condition. Will start with 2 unit increase to basaglar each day until fasting BG is around 120. Discussed using caution when becoming more active with increases  in insulin to prevent hypoglycemic episodes. Will follow up in 1 month to discuss improvement.  Syncope Patient overall improving well gradually with increasing strength, energy, and appetite without frank bleeding or syncopal episodes. Discussed continuing gradual changes. Dizziness solely near the end of urination could be of vasovagal origin after prostatectomy. Counseled on sitting down when urinating to decrease risk of falls. Patient can also follow up with urologist. If symptoms persist in 1 month, will discuss further workup at diabetes checkup. Check renal function and CBC today.  Chronic kidney disease, stage II (mild) Patient's current dose of Crestor at 40 mg will need to be decreased given kidney function with GFR of 23. New prescription for 10 mg sent in. Will also get updated BMP today to assess kidney function.  Hyperlipidemia Renally adjusting crestor down to 10mg  daily, new rx sent in   Health maintenance Patient received the flu vaccine and bivalent COVID booster today.  Ethelene Hal, [place]  Patient seen along with MS3 student [facility]. I personally evaluated this patient along with the student, and verified all aspects of the history, physical exam, and medical decision making as documented by the student. I agree with the student's documentation and have made all necessary edits.  Chrisandra Netters, MD  [place]

## 2021-10-29 DIAGNOSIS — R911 Solitary pulmonary nodule: Secondary | ICD-10-CM | POA: Diagnosis not present

## 2021-10-29 DIAGNOSIS — N401 Enlarged prostate with lower urinary tract symptoms: Secondary | ICD-10-CM | POA: Diagnosis not present

## 2021-10-29 DIAGNOSIS — C9000 Multiple myeloma not having achieved remission: Secondary | ICD-10-CM | POA: Diagnosis not present

## 2021-10-29 DIAGNOSIS — R6889 Other general symptoms and signs: Secondary | ICD-10-CM | POA: Diagnosis not present

## 2021-10-29 DIAGNOSIS — D472 Monoclonal gammopathy: Secondary | ICD-10-CM | POA: Diagnosis not present

## 2021-10-29 DIAGNOSIS — R3129 Other microscopic hematuria: Secondary | ICD-10-CM | POA: Diagnosis not present

## 2021-10-31 ENCOUNTER — Encounter: Payer: Self-pay | Admitting: Family Medicine

## 2021-11-09 ENCOUNTER — Other Ambulatory Visit: Payer: Self-pay

## 2021-11-09 ENCOUNTER — Ambulatory Visit (INDEPENDENT_AMBULATORY_CARE_PROVIDER_SITE_OTHER): Payer: Medicare HMO | Admitting: Family Medicine

## 2021-11-09 ENCOUNTER — Encounter: Payer: Self-pay | Admitting: Family Medicine

## 2021-11-09 VITALS — BP 155/80 | HR 65 | Ht 71.0 in | Wt 157.0 lb

## 2021-11-09 DIAGNOSIS — E119 Type 2 diabetes mellitus without complications: Secondary | ICD-10-CM

## 2021-11-09 DIAGNOSIS — I10 Essential (primary) hypertension: Secondary | ICD-10-CM

## 2021-11-09 DIAGNOSIS — Z794 Long term (current) use of insulin: Secondary | ICD-10-CM

## 2021-11-09 LAB — POCT GLYCOSYLATED HEMOGLOBIN (HGB A1C): HbA1c, POC (controlled diabetic range): 9.6 % — AB (ref 0.0–7.0)

## 2021-11-09 NOTE — Patient Instructions (Signed)
It was wonderful to see you today.  Please bring ALL of your medications with you to every visit.   Today we talked about:  --Increasing your lantus--continue to go up by 2 units until your morning blood glucose is <120  --Please monitor your blood pressures and bring log by our office  ---Follow up in 3-4 weeks with Dr. Georgina Peer or Dr. Valentina Lucks  Tips for reducing sugars - Earlier dinner - Try walking after dinner - Brush your teeth after dinner    Thank you for choosing [facility].   Please call [phone] with any questions about today's appointment.  Please be sure to schedule follow up with Dr. Valentina Lucks or Dr. Georgina Peer at the front  desk before you leave today.   [name], MD  Family Medicine

## 2021-11-09 NOTE — Assessment & Plan Note (Signed)
Patient reports since his prostate surgery he has had a very good appetite.  Suspect this is the cause.  We discussed dietary changes.  Although GFR is low could consider addition of GLP-1 agonist.  Today we increased his Lantus further he is going to continue increase by 2 units until his glucose is less than 120.  If he hits 50 units he is to call us full consent to twice daily dosing.  He is determined to diet changes and will follow up with [place] in 2 weeks to check in regarding his blood glucose as well.

## 2021-11-09 NOTE — Assessment & Plan Note (Addendum)
Not well controlled today.  He is asymptomatic.  Previously he had syncope and hypotension with medications.  He would very much like to monitor his blood pressures at home which have been in the 130s to 140s over 80s at home.  He will bring his home monitor for this.  He is to follow-up in 2 weeks.  In 2 weeks if he still has elevated blood pressures recommend adding amlodipine 5 mg.

## 2021-11-09 NOTE — Progress Notes (Signed)
    SUBJECTIVE:   CHIEF COMPLAINT: diabetes  HPI:   Richard Gill. is a 65 y.o. yo with history notable for smoldering myeloma, type 2 diabetes on insulin therapy, chronic kidney disease, hypertension and BPH s/p proctectomy presenting for routine follow-up for type 2 diabetes and blood pressure.  In terms of the patient's diabetes he was recently seen by his primary care physician Dr. Ardelia Mems who noted worsening hyperglycemia.  He has been increasing his insulin is up to 40 units once a day.  He denies hypoglycemia.  He does endorse some craving of ice water as he got really used to this with his anemia (had proctectomy then bleeding).  He is taking his empagliflozin as prescribed.  In terms of the patient's blood pressure is taking his antihypertensives as prescribed.  He denies headaches, chest pain, dyspnea, lower extremity edema or vision changes.  He reports home measurements in the 595G and 387F systolic numbers in 64P to 32R as diastolics.  PERTINENT  PMH / PSH/Family/Social History : Updated and reviewed as appropriate  OBJECTIVE:   BP (!) 155/80   Pulse 65   Ht 5\' 11"  (1.803 m)   Wt 157 lb (71.2 kg)   SpO2 100%   BMI 21.90 kg/m   Today's weight:  Last Weight  Most recent update: 11/09/2021  9:18 AM    Weight  71.2 kg (157 lb)            Review of prior weights: [employer]   11/09/21 0918  Weight: 157 lb (71.2 kg)    Cardiac: Regular rate and rhythm. Normal S1/S2. No murmurs, rubs, or gallops appreciated. Lungs: Clear bilaterally to ascultation.  Abdomen: Normoactive bowel sounds. No tenderness to deep or light palpation. No rebound or guarding.   Psych: Pleasant and appropriate  A1c is reviewed and not at goal.  We discussed this at length as below  ASSESSMENT/PLAN:   HTN (hypertension) Not well controlled today.  He is asymptomatic.  Previously he had syncope and hypotension with medications.  He would very much like to monitor his blood pressures at  home which have been in the 130s to 140s over 80s at home.  He will bring his home monitor for this.  He is to follow-up in 2 weeks.  In 2 weeks if he still has elevated blood pressures recommend adding amlodipine 5 mg.  T2DM (type 2 diabetes mellitus) ([place]) Patient reports since his prostate surgery he has had a very good appetite.  Suspect this is the cause.  We discussed dietary changes.  Although GFR is low could consider addition of GLP-1 agonist.  Today we increased his Lantus further he is going to continue increase by 2 units until his glucose is less than 120.  If he hits 50 units he is to call us full consent to twice daily dosing.  He is determined to diet changes and will follow up with [place] in 2 weeks to check in regarding his blood glucose as well.       [name], [place]

## 2021-11-13 ENCOUNTER — Telehealth: Payer: Self-pay

## 2021-11-13 MED ORDER — AMLODIPINE BESYLATE 5 MG PO TABS: 5.0000 mg | ORAL_TABLET | Freq: Every day | ORAL | 3 refills | Status: DC

## 2021-11-13 NOTE — Telephone Encounter (Signed)
Called patient.  Expressed condolences to him and his family.  Started Norvasc 5 mg.  He is to follow-up in a month to check blood pressure.  He will continue to monitor blood pressure at home.  All questions answered.  He will call back if he needs additional grief resources.

## 2021-11-13 NOTE — Telephone Encounter (Signed)
Patient calls nurse line requesting to start BP medication. Patient reports that BP continues to be elevated. Patient states that he lost his son last night and has been having difficult time getting BP to come down. Reports last BP of 146/90. Patient is currently asymptomatic.   Please advise.   Talbot Grumbling, RN

## 2021-11-14 DIAGNOSIS — N183 Chronic kidney disease, stage 3 unspecified: Secondary | ICD-10-CM | POA: Diagnosis not present

## 2021-11-14 DIAGNOSIS — D509 Iron deficiency anemia, unspecified: Secondary | ICD-10-CM | POA: Diagnosis not present

## 2021-11-14 DIAGNOSIS — D472 Monoclonal gammopathy: Secondary | ICD-10-CM | POA: Diagnosis not present

## 2021-11-14 DIAGNOSIS — E1122 Type 2 diabetes mellitus with diabetic chronic kidney disease: Secondary | ICD-10-CM | POA: Diagnosis not present

## 2021-11-14 DIAGNOSIS — N400 Enlarged prostate without lower urinary tract symptoms: Secondary | ICD-10-CM | POA: Diagnosis not present

## 2021-11-14 DIAGNOSIS — N2581 Secondary hyperparathyroidism of renal origin: Secondary | ICD-10-CM | POA: Diagnosis not present

## 2021-11-14 DIAGNOSIS — N189 Chronic kidney disease, unspecified: Secondary | ICD-10-CM | POA: Diagnosis not present

## 2021-11-14 DIAGNOSIS — I129 Hypertensive chronic kidney disease with stage 1 through stage 4 chronic kidney disease, or unspecified chronic kidney disease: Secondary | ICD-10-CM | POA: Diagnosis not present

## 2021-11-22 ENCOUNTER — Other Ambulatory Visit: Payer: Self-pay | Admitting: Family Medicine

## 2021-11-28 ENCOUNTER — Encounter: Payer: Self-pay | Admitting: Urology

## 2021-11-28 ENCOUNTER — Other Ambulatory Visit (HOSPITAL_COMMUNITY): Payer: Self-pay | Admitting: *Deleted

## 2021-11-29 ENCOUNTER — Other Ambulatory Visit: Payer: Self-pay

## 2021-11-29 ENCOUNTER — Ambulatory Visit (HOSPITAL_COMMUNITY)
Admission: RE | Admit: 2021-11-29 | Discharge: 2021-11-29 | Disposition: A | Discharge status: Home / Self Care | Payer: Medicare HMO | Source: Ambulatory Visit | Attending: Nephrology | Admitting: Nephrology

## 2021-11-29 DIAGNOSIS — D631 Anemia in chronic kidney disease: Secondary | ICD-10-CM | POA: Insufficient documentation

## 2021-11-29 MED ORDER — FERUMOXYTOL INJECTION 510 MG/17 ML
510.0000 mg | INTRAVENOUS | Status: DC
  Administered 2021-11-29: 12:00:00 510 mg via INTRAVENOUS
  Filled 2021-11-29: qty 510

## 2021-12-05 ENCOUNTER — Encounter (HOSPITAL_COMMUNITY)
Admission: RE | Admit: 2021-12-05 | Discharge: 2021-12-05 | Disposition: A | Discharge status: Home / Self Care | Payer: Medicare HMO | Source: Ambulatory Visit | Attending: Nephrology | Admitting: Nephrology

## 2021-12-05 DIAGNOSIS — D631 Anemia in chronic kidney disease: Secondary | ICD-10-CM | POA: Insufficient documentation

## 2021-12-05 MED ORDER — SODIUM CHLORIDE 0.9 % IV SOLN
510.0000 mg | INTRAVENOUS | Status: DC
  Administered 2021-12-05: 08:00:00 510 mg via INTRAVENOUS
  Filled 2021-12-05: qty 510

## 2021-12-06 ENCOUNTER — Encounter (HOSPITAL_COMMUNITY): Payer: Medicare HMO

## 2021-12-06 ENCOUNTER — Ambulatory Visit: Payer: Medicare HMO | Admitting: Urology

## 2021-12-13 ENCOUNTER — Encounter: Payer: Self-pay | Admitting: Urology

## 2021-12-13 ENCOUNTER — Other Ambulatory Visit: Payer: Self-pay

## 2021-12-13 ENCOUNTER — Ambulatory Visit (INDEPENDENT_AMBULATORY_CARE_PROVIDER_SITE_OTHER): Payer: Medicare HMO | Admitting: Urology

## 2021-12-13 VITALS — BP 132/68 | HR 78 | Ht 71.0 in | Wt 151.0 lb

## 2021-12-13 DIAGNOSIS — N400 Enlarged prostate without lower urinary tract symptoms: Secondary | ICD-10-CM | POA: Diagnosis not present

## 2021-12-13 LAB — BLADDER SCAN AMB NON-IMAGING

## 2021-12-13 NOTE — Progress Notes (Signed)
° °  12/13/2021 2:25 PM   Richard Gill. January 07, 1956 [id number]  Reason for visit: Follow up BPH status post HOLEP  HPI: 66 year old male who was referred from Dr. Abner Greenspan at [facility] urology to consider HOLEP.  Primary symptoms were weak stream and recurrent gross hematuria.  He underwent a HOLEP on 09/07/2021 with removal of 90 g of benign tissue.  He had persistent hematuria requiring CBI the day of surgery, and was taken back to the OR that afternoon for fulguration of a small area of oozing at the trigone and posterior bladder wall.  He reports he is doing very well.  He is urinating with a strong stream.  Denies any significant incontinence, and is only having some mild leakage with strenuous activity that continues to improve.  PVR is normal at 47 mL.  No problems with recurrent gross hematuria.  I am happy to see him on a yearly basis for PVR, or he can follow-up with his urologist locally in [facility] Dr. Abner Greenspan.  He is leaning toward following up with Dr. Abner Greenspan and will call their office for ongoing yearly PVR and monitoring.   Billey Co, [facility] [address], [place], [place] [address] [phone]

## 2021-12-13 NOTE — Patient Instructions (Signed)
Kegel Exercises Kegel exercises can help strengthen your pelvic floor muscles. The pelvic floor is a group of muscles that support your rectum, small intestine, and bladder. In females, pelvic floor muscles also help support the uterus. These muscles help you control the flow of urine and stool (feces). Kegel exercises are painless and simple. They do not require any equipment. Your provider may suggest Kegel exercises to: Improve bladder and bowel control. Improve sexual response. Improve weak pelvic floor muscles after surgery to remove the uterus (hysterectomy) or after pregnancy, in females. Improve weak pelvic floor muscles after prostate gland removal or surgery, in males. Kegel exercises involve squeezing your pelvic floor muscles. These are the same muscles you squeeze when you try to stop the flow of urine or keep from passing gas. The exercises can be done while sitting, standing, or lying down, but it is best to vary your position. Ask your health care provider which exercises are safe for you. Do exercises exactly as told by your health care provider and adjust them as directed. Do not begin these exercises until told by your health care provider. Exercises How to do Kegel exercises: Squeeze your pelvic floor muscles tight. You should feel a tight lift in your rectal area. If you are a male, you should also feel a tightness in your vaginal area. Keep your stomach, buttocks, and legs relaxed. Hold the muscles tight for up to 10 seconds. Breathe normally. Relax your muscles for up to 10 seconds. Repeat as told by your health care provider. Repeat this exercise daily as told by your health care provider. Continue to do this exercise for at least 4-6 weeks, or for as long as told by your health care provider. You may be referred to a physical therapist who can help you learn more about how to do Kegel exercises. Depending on your condition, your health care provider may  recommend: Varying how long you squeeze your muscles. Doing several sets of exercises every day. Doing exercises for several weeks. Making Kegel exercises a part of your regular exercise routine. This information is not intended to replace advice given to you by your health care provider. Make sure you discuss any questions you have with your health care provider. Document Revised: 04/05/2021 Document Reviewed: 04/05/2021 Elsevier Patient Education  2022 [employer].

## 2022-03-14 ENCOUNTER — Telehealth: Payer: Self-pay | Admitting: *Deleted

## 2022-03-14 NOTE — Chronic Care Management (AMB) (Signed)
?  Care Management  ? ?Note ? ?03/14/2022 ?Name: Richard Gill. MRN: [id number] DOB: 05/01/1956 ? ?Richard Gill. is a 66 y.o. year old male who is a primary care patient of Richard Mems Delorse Limber, MD. I reached out to Richard Gill. by phone today offer care coordination services.  ? ?Richard Gill was given information about care management services today including:  ?Care management services include personalized support from designated clinical staff supervised by his physician, including individualized plan of care and coordination with other care providers ?24/7 contact phone numbers for assistance for urgent and routine care needs. ?The patient may stop care management services at any time by phone call to the office staff. ? ?Patient agreed to services and verbal consent obtained.  ? ?Follow up plan: ?Telephone appointment with care management team member scheduled for:03/21/22 ? ?Richard Gill  ?Care Guide, Embedded Care Coordination ?  Care Management  ?Direct Dial: [phone] ? ?

## 2022-03-21 ENCOUNTER — Telehealth: Payer: Medicare HMO

## 2022-03-21 ENCOUNTER — Telehealth: Payer: Self-pay

## 2022-03-21 NOTE — Telephone Encounter (Signed)
? ?  RN Case Manager ?Care Management  ? Phone Outreach  ? ? ?03/21/2022 ?Name: Richard Gill. MRN: [id number] DOB: 10-03-1956 ? ?Richard Gill. is a 66 y.o. year old male who is a primary care patient of Ardelia Mems Delorse Limber, MD .  ? ?Telephone outreach was unsuccessful Unable to leave a HIPPA compliant phone message due to voice mail full. ? ?Follow Up Plan: Will route chart to Care Guide to see if patient would like to reschedule phone appointment.   ? ?Review of patient status, including review of consultants reports, relevant laboratory and other test results, and collaboration with appropriate care team members and the patient's provider was performed as part of comprehensive patient evaluation and provision of care management services.   ? ?Lazaro Arms RN, BSN, [facility] ?Care Management Coordinator ?[place] ?Phone: [phone] Fax: [phone] ?  ? ? ? ? ? ? ? ? ? ? ? ?

## 2022-03-28 ENCOUNTER — Telehealth: Payer: Self-pay | Admitting: *Deleted

## 2022-03-28 NOTE — Chronic Care Management (AMB) (Signed)
?  Care Management  ? ?Note ? ?03/28/2022 ?Name: Richard Gill. MRN: [id number] DOB: [birth year] ? ?Richard Gill. is a 66 y.o. year old male who is a primary care patient of Leeanne Rio, MD and is actively engaged with the care management team. I reached out to [name]. by phone today to assist with re-scheduling an initial visit with the RN Case Manager ? ?Follow up plan: ?Unsuccessful telephone outreach attempt made. The care management team will reach out to the patient again over the next 7 days. If patient returns call to provider office, please advise to call [place]  at [phone]. ? ?Laverda Sorenson  ?Care Guide, Embedded Care Coordination ?[place]  Care Management  ?Direct Dial: [phone] ? ?

## 2022-04-04 NOTE — Chronic Care Management (AMB) (Signed)
?  Care Management  ? ?Note ? ?04/04/2022 ?Name: Richard Gill. MRN: [id number] DOB: 04/21/1956 ? ?Richard Gill. is a 66 y.o. year old male who is a primary care patient of Leeanne Rio, MD and is actively engaged with the care management team. I reached out to [name]. by phone today to assist with re-scheduling an initial visit with the RN Case Manager ? ?Follow up plan: ?Unsuccessful telephone outreach attempt made. The care management team will reach out to the patient again over the next 7 days. If patient returns call to provider office, please advise to call [place] at [phone]. ? ?Laverda Sorenson  ?Care Guide, Embedded Care Coordination ?[place]  Care Management  ?Direct Dial: [phone] ? ?

## 2022-04-08 DIAGNOSIS — N183 Chronic kidney disease, stage 3 unspecified: Secondary | ICD-10-CM | POA: Diagnosis not present

## 2022-04-08 DIAGNOSIS — N2581 Secondary hyperparathyroidism of renal origin: Secondary | ICD-10-CM | POA: Diagnosis not present

## 2022-04-11 NOTE — Chronic Care Management (AMB) (Signed)
?  Care Coordination ?Note ? ?04/11/2022 ?Name: Richard Gill. MRN: [id number] DOB: 09-05-1956 ? ?[name]. is a 66 y.o. year old male who is a primary care patient of Leeanne Rio, MD and is actively engaged with the care management team. I reached out to [name]. by phone today to assist with re-scheduling an initial visit with the RN Case Manager ? ?Follow up plan: ?Unsuccessful telephone outreach attempt made. Unable to make contact on outreach attempts x 3. PCP Leeanne Rio, MD notified via routed documentation in medical record. We have been unable to make contact with the patient for follow up. The care management team is available to follow up with the patient after provider conversation with the patient regarding recommendation for care management engagement and subsequent re-referral to the care management team.  ? ?Laverda Sorenson  ?Care Guide, Embedded Care Coordination ?[place]  Care Management  ?Direct Dial: [phone] ? ?

## 2022-04-16 DIAGNOSIS — N2581 Secondary hyperparathyroidism of renal origin: Secondary | ICD-10-CM | POA: Diagnosis not present

## 2022-04-16 DIAGNOSIS — E1122 Type 2 diabetes mellitus with diabetic chronic kidney disease: Secondary | ICD-10-CM | POA: Diagnosis not present

## 2022-04-16 DIAGNOSIS — I129 Hypertensive chronic kidney disease with stage 1 through stage 4 chronic kidney disease, or unspecified chronic kidney disease: Secondary | ICD-10-CM | POA: Diagnosis not present

## 2022-04-16 DIAGNOSIS — N184 Chronic kidney disease, stage 4 (severe): Secondary | ICD-10-CM | POA: Diagnosis not present

## 2022-04-25 ENCOUNTER — Ambulatory Visit (INDEPENDENT_AMBULATORY_CARE_PROVIDER_SITE_OTHER): Payer: Medicare HMO | Admitting: Family Medicine

## 2022-04-25 VITALS — BP 137/75 | HR 77 | Wt 162.0 lb

## 2022-04-25 DIAGNOSIS — D472 Monoclonal gammopathy: Secondary | ICD-10-CM | POA: Diagnosis not present

## 2022-04-25 DIAGNOSIS — S61219A Laceration without foreign body of unspecified finger without damage to nail, initial encounter: Secondary | ICD-10-CM | POA: Diagnosis not present

## 2022-04-25 DIAGNOSIS — Z794 Long term (current) use of insulin: Secondary | ICD-10-CM

## 2022-04-25 DIAGNOSIS — N184 Chronic kidney disease, stage 4 (severe): Secondary | ICD-10-CM

## 2022-04-25 DIAGNOSIS — I10 Essential (primary) hypertension: Secondary | ICD-10-CM | POA: Diagnosis not present

## 2022-04-25 DIAGNOSIS — E119 Type 2 diabetes mellitus without complications: Secondary | ICD-10-CM | POA: Diagnosis not present

## 2022-04-25 DIAGNOSIS — Z23 Encounter for immunization: Secondary | ICD-10-CM | POA: Diagnosis not present

## 2022-04-25 LAB — POCT GLYCOSYLATED HEMOGLOBIN (HGB A1C): HbA1c, POC (controlled diabetic range): 7.6 % — AB (ref 0.0–7.0)

## 2022-04-25 NOTE — Assessment & Plan Note (Signed)
A1c improved today to 7.6. Having occasional lows to 70s-80s but patient reports not eating consistently. Discussed having regular snacks to maintain cbg's in range. Overall sugars much better than in the past. Continue glargine 35u daily, monitor sugars. Patient to call if having lows consistently and we will adjust. Continue jardiance '10mg'$  daily. Follow up in 3 months, sooner if sugars lower.

## 2022-04-25 NOTE — Assessment & Plan Note (Signed)
Following regularly with nephrology. Will request records from recent visit.

## 2022-04-25 NOTE — Assessment & Plan Note (Signed)
Well controlled. Continue current medication regimen.  

## 2022-04-25 NOTE — Patient Instructions (Signed)
It was great to see you again today!  Updating tetanus shot today because of the cut on your finger. Let us know if it worsens at all.  Let me know if you want to see foot doctor for the painful area on your right foot  Continue current insulin dose. Try to eat consistently throughout the day to help regulate sugars.  Follow up in 3 months, sooner if needed or if sugars dropping lower.  Be well, Dr. Ardelia Mems

## 2022-04-25 NOTE — Progress Notes (Signed)
  Date of Visit: 04/25/2022   SUBJECTIVE:   HPI:  Richard Gill presents today for routine follow up.  Diabetes - taking insulin glargine 40u daily and jardiance '10mg'$  daily. Overall tolerating this pretty well. No major low sugars but does occasionally drop to 70s-80s.  Hypertension - taking amlodipine '5mg'$  daily  Hyperlipidemia - taking crestor '10mg'$  daily (renally dosed)  Following up routinely with nephrology  Having some pain on the right side of his foot, worse with standing/walking in his boots at work. Not painful presently. He has gotten some over the counter inserts and wants to try them  Cut his finger yesterday on a clean piece of metal at home. No excessive bleeding or pain.  OBJECTIVE:   BP 137/75   Pulse 77   Wt 162 lb (73.5 kg)   SpO2 100%   BMI 22.59 kg/m  Gen: no acute distress, pleasant cooperative HEENT: normocephalic, atraumatic  Heart: regular rate and rhythm, no murmur Lungs: clear to auscultation bilaterally, normal work of breathing  Neuro: alert, speech normal Diabetic foot exam: 2+ DP pulses bilat, normal monofilament testing bilaterally. No lesions or significant calluses. Bony prominence on lateral aspect of R foot Extremity: 37m cut on tip of finger, hemostatic without drainage or surrounding erythema  ASSESSMENT/PLAN:   Health maintenance:  -foot exam done today, normal -Tdap updated today given cut on finger -patient has rx for shingrix, still contemplating getting it -[facility]  T2DM (type 2 diabetes mellitus) [place] A1c improved today to 7.6. Having occasional lows to 70s-80s but patient reports not eating consistently. Discussed having regular snacks to maintain cbg's in range. Overall sugars much better than in the past. Continue glargine 35u daily, monitor sugars. Patient to call if having lows consistently and we will adjust. Continue jardiance '10mg'$  daily. Follow up in 3 months, sooner if sugars  lower.  CKD (chronic kidney disease), stage IV ([facility] Following regularly with nephrology. Will request records from recent visit.  Monoclonal gammopathy Following regularly with heme/onc, overall issue stable.  HTN (hypertension) Well controlled. Continue current medication regimen.   Foot pain Suspect mechanical. Patient will begin with shoe inserts. He will let me know if he'd like to go to podiatry for evaluation and possibly custom made inserts.  FOLLOW UP: Follow up in 3 mos for above issues  [name]

## 2022-04-25 NOTE — Assessment & Plan Note (Signed)
Following regularly with heme/onc, overall issue stable.

## 2022-04-29 DIAGNOSIS — D472 Monoclonal gammopathy: Secondary | ICD-10-CM | POA: Diagnosis not present

## 2022-04-29 DIAGNOSIS — Z0000 Encounter for general adult medical examination without abnormal findings: Secondary | ICD-10-CM | POA: Diagnosis not present

## 2022-04-29 DIAGNOSIS — R6889 Other general symptoms and signs: Secondary | ICD-10-CM | POA: Diagnosis not present

## 2022-04-29 DIAGNOSIS — C9001 Multiple myeloma in remission: Secondary | ICD-10-CM | POA: Diagnosis not present

## 2022-04-29 DIAGNOSIS — D500 Iron deficiency anemia secondary to blood loss (chronic): Secondary | ICD-10-CM | POA: Diagnosis not present

## 2022-04-29 DIAGNOSIS — R3129 Other microscopic hematuria: Secondary | ICD-10-CM | POA: Diagnosis not present

## 2022-04-29 DIAGNOSIS — N400 Enlarged prostate without lower urinary tract symptoms: Secondary | ICD-10-CM | POA: Diagnosis not present

## 2022-04-29 DIAGNOSIS — R911 Solitary pulmonary nodule: Secondary | ICD-10-CM | POA: Diagnosis not present

## 2022-04-29 DIAGNOSIS — C9000 Multiple myeloma not having achieved remission: Secondary | ICD-10-CM | POA: Diagnosis not present

## 2022-04-29 DIAGNOSIS — N184 Chronic kidney disease, stage 4 (severe): Secondary | ICD-10-CM | POA: Diagnosis not present

## 2022-06-09 ENCOUNTER — Other Ambulatory Visit: Payer: Self-pay | Admitting: Family Medicine

## 2022-06-12 DIAGNOSIS — H52203 Unspecified astigmatism, bilateral: Secondary | ICD-10-CM | POA: Diagnosis not present

## 2022-06-12 DIAGNOSIS — E113293 Type 2 diabetes mellitus with mild nonproliferative diabetic retinopathy without macular edema, bilateral: Secondary | ICD-10-CM | POA: Diagnosis not present

## 2022-06-12 DIAGNOSIS — H2513 Age-related nuclear cataract, bilateral: Secondary | ICD-10-CM | POA: Diagnosis not present

## 2022-06-21 ENCOUNTER — Other Ambulatory Visit: Payer: Self-pay | Admitting: Urology

## 2022-06-21 DIAGNOSIS — N281 Cyst of kidney, acquired: Secondary | ICD-10-CM

## 2022-06-25 ENCOUNTER — Encounter: Payer: Self-pay | Admitting: Family Medicine

## 2022-06-25 LAB — HM DIABETES EYE EXAM

## 2022-06-26 DIAGNOSIS — N281 Cyst of kidney, acquired: Secondary | ICD-10-CM | POA: Diagnosis not present

## 2022-06-26 DIAGNOSIS — N401 Enlarged prostate with lower urinary tract symptoms: Secondary | ICD-10-CM | POA: Diagnosis not present

## 2022-06-26 DIAGNOSIS — R972 Elevated prostate specific antigen [PSA]: Secondary | ICD-10-CM | POA: Diagnosis not present

## 2022-06-26 DIAGNOSIS — R350 Frequency of micturition: Secondary | ICD-10-CM | POA: Diagnosis not present

## 2022-07-24 ENCOUNTER — Ambulatory Visit
Admission: RE | Admit: 2022-07-24 | Discharge: 2022-07-24 | Disposition: A | Discharge status: Home / Self Care | Payer: Medicare HMO | Source: Ambulatory Visit | Attending: Urology | Admitting: Urology

## 2022-07-24 DIAGNOSIS — N281 Cyst of kidney, acquired: Secondary | ICD-10-CM | POA: Diagnosis not present

## 2022-07-24 DIAGNOSIS — N2889 Other specified disorders of kidney and ureter: Secondary | ICD-10-CM | POA: Diagnosis not present

## 2022-07-24 DIAGNOSIS — M5137 Other intervertebral disc degeneration, lumbosacral region: Secondary | ICD-10-CM | POA: Diagnosis not present

## 2022-07-24 MED ORDER — GADOBENATE DIMEGLUMINE 529 MG/ML IV SOLN
14.0000 mL | Freq: Once | INTRAVENOUS | Status: AC | PRN
  Administered 2022-07-24: 14 mL via INTRAVENOUS

## 2022-08-23 DIAGNOSIS — N184 Chronic kidney disease, stage 4 (severe): Secondary | ICD-10-CM | POA: Diagnosis not present

## 2022-09-02 DIAGNOSIS — N184 Chronic kidney disease, stage 4 (severe): Secondary | ICD-10-CM | POA: Diagnosis not present

## 2022-09-02 DIAGNOSIS — E1122 Type 2 diabetes mellitus with diabetic chronic kidney disease: Secondary | ICD-10-CM | POA: Diagnosis not present

## 2022-09-02 DIAGNOSIS — I129 Hypertensive chronic kidney disease with stage 1 through stage 4 chronic kidney disease, or unspecified chronic kidney disease: Secondary | ICD-10-CM | POA: Diagnosis not present

## 2022-09-02 DIAGNOSIS — N2581 Secondary hyperparathyroidism of renal origin: Secondary | ICD-10-CM | POA: Diagnosis not present

## 2022-10-04 ENCOUNTER — Other Ambulatory Visit: Payer: Self-pay | Admitting: Family Medicine

## 2022-10-04 NOTE — Telephone Encounter (Signed)
Please let patient know I am refilling this medication, but he needs to schedule an appointment with me to follow up on his diabetes   Thanks, Leeanne Rio, MD

## 2022-10-14 ENCOUNTER — Telehealth: Payer: Self-pay

## 2022-10-14 NOTE — Patient Outreach (Signed)
  Care Coordination   10/14/2022 Name: Richard Gill. MRN: [id number] DOB: 06-May-1956   Care Coordination Outreach Attempts:  An unsuccessful telephone outreach was attempted today to offer the patient information about available care coordination services as a benefit of their health plan.   Follow Up Plan:  Additional outreach attempts will be made to offer the patient care coordination information and services.   Encounter Outcome:  No Answer  Care Coordination Interventions Activated:  No   Care Coordination Interventions:  No, not indicated    Johnney Killian, RN, BSN, CCM Care Management Coordinator [facility] Phone: [phone]: [phone]

## 2022-10-28 DIAGNOSIS — R911 Solitary pulmonary nodule: Secondary | ICD-10-CM | POA: Diagnosis not present

## 2022-10-28 DIAGNOSIS — R6889 Other general symptoms and signs: Secondary | ICD-10-CM | POA: Diagnosis not present

## 2022-10-28 DIAGNOSIS — C9000 Multiple myeloma not having achieved remission: Secondary | ICD-10-CM | POA: Diagnosis not present

## 2022-10-28 DIAGNOSIS — D472 Monoclonal gammopathy: Secondary | ICD-10-CM | POA: Diagnosis not present

## 2022-10-28 DIAGNOSIS — N184 Chronic kidney disease, stage 4 (severe): Secondary | ICD-10-CM | POA: Diagnosis not present

## 2022-10-28 DIAGNOSIS — N400 Enlarged prostate without lower urinary tract symptoms: Secondary | ICD-10-CM | POA: Diagnosis not present

## 2022-11-08 DIAGNOSIS — Z936 Other artificial openings of urinary tract status: Secondary | ICD-10-CM | POA: Diagnosis not present

## 2022-12-12 ENCOUNTER — Encounter: Payer: Self-pay | Admitting: Family Medicine

## 2022-12-12 ENCOUNTER — Ambulatory Visit (INDEPENDENT_AMBULATORY_CARE_PROVIDER_SITE_OTHER): Payer: Medicare HMO | Admitting: Pharmacist

## 2022-12-12 ENCOUNTER — Ambulatory Visit (INDEPENDENT_AMBULATORY_CARE_PROVIDER_SITE_OTHER): Payer: Medicare HMO | Admitting: Family Medicine

## 2022-12-12 VITALS — Wt 161.8 lb

## 2022-12-12 VITALS — BP 132/70 | HR 61 | Ht 71.0 in | Wt 163.0 lb

## 2022-12-12 DIAGNOSIS — R748 Abnormal levels of other serum enzymes: Secondary | ICD-10-CM

## 2022-12-12 DIAGNOSIS — I10 Essential (primary) hypertension: Secondary | ICD-10-CM | POA: Diagnosis not present

## 2022-12-12 DIAGNOSIS — E119 Type 2 diabetes mellitus without complications: Secondary | ICD-10-CM

## 2022-12-12 DIAGNOSIS — Z794 Long term (current) use of insulin: Secondary | ICD-10-CM | POA: Diagnosis not present

## 2022-12-12 DIAGNOSIS — E785 Hyperlipidemia, unspecified: Secondary | ICD-10-CM | POA: Diagnosis not present

## 2022-12-12 DIAGNOSIS — Z23 Encounter for immunization: Secondary | ICD-10-CM

## 2022-12-12 LAB — POCT GLYCOSYLATED HEMOGLOBIN (HGB A1C): HbA1c, POC (controlled diabetic range): 7.9 % — AB (ref 0.0–7.0)

## 2022-12-12 MED ORDER — TRULICITY 0.75 MG/0.5ML ~~LOC~~ SOAJ: 0.7500 mg | SUBCUTANEOUS | 11 refills | Status: DC

## 2022-12-12 MED ORDER — BASAGLAR KWIKPEN 100 UNIT/ML ~~LOC~~ SOPN: 30.0000 [IU] | PEN_INJECTOR | Freq: Every day | SUBCUTANEOUS | 2 refills | Status: DC

## 2022-12-12 NOTE — Progress Notes (Signed)
  Date of Visit: 12/12/2022   SUBJECTIVE:   HPI:  Richard Gill presents today for routine follow-up.  Diabetes: Currently taking insulin glargine 39 units daily and Jardiance 10 mg daily.  Takes his insulin mostly at night.  Sugars range from elevated fasting sugars in the 100s to 200s, and tend to occasionally run lower in the 50s or 60s around 6 PM.  He does not eat consistently.  He can feel when his sugars drop low, and his Richard Gill has also been good about alerting him when his sugar runs low overnight.  He is agreeable to seeing Dr. Everitt Amber.  Hyperlipidemia: Currently taking Crestor 10 mg daily.  He is on a lower dose due to his renal function.  Hypertension: Currently taking amlodipine 5 mg daily.  Tolerating this well.  No swelling in his legs.  Has follow-up in place with nephrology later this month.  OBJECTIVE:   BP 132/70   Pulse 61   Ht '5\' 11"'$  (1.803 m)   Wt 163 lb (73.9 kg)   SpO2 99%   BMI 22.73 kg/m  Gen: no acute distress, pleasant, cooperative, well appearing HEENT: normocephalic, atraumatic  Heart: regular rate and rhythm, no murmur Lungs: clear to auscultation bilaterally, normal work of breathing  Neuro: alert speech normal, grossly nonfocal Ext: No appreciable lower extremity edema bilaterally   ASSESSMENT/PLAN:   Health maintenance:  -Flu shot given today -COVID shot given today -Urine microalbumin obtained today -Interested in annual wellness visit, will message team to schedule.  T2DM (type 2 diabetes mellitus) ([place]) Uncontrolled with A1c of 7.9.  Difficult to make adjustments given his somewhat erratic sugars, and inconsistent eating habits.  I think he would benefit from meeting with our pharmacy team for more intensive diabetes.  Scheduled to see Dr. Valentina Lucks this afternoon at 1:30.  Hyperlipidemia Update lipids today.  Continue statin at renal dose.  HTN (hypertension) Well-controlled, continue current regimen.  Elevated alkaline phosphatase  level Etiology not clear, may be related somehow to his smoldering myeloma.  Review of labs from the last year show that it has been consistently elevated in the 160s to 170s.  Recheck CMP today along with GGT to further assess.  FOLLOW UP: Follow up in 3 months with me for next A1c  [name], [place]

## 2022-12-12 NOTE — Assessment & Plan Note (Signed)
Etiology not clear, may be related somehow to his smoldering myeloma.  Review of labs from the last year show that it has been consistently elevated in the 160s to 170s.  Recheck CMP today along with GGT to further assess.

## 2022-12-12 NOTE — Assessment & Plan Note (Signed)
Diabetes longstanding, currently slightly above goal based on A1c (7.9%). Patient is able to verbalize appropriate hypoglycemia management plan. Medication adherence appears appropriate. Control is suboptimal due to needing therapy adjustment. -Decreased dose of basal insulin Basaglar (insulin glargine) from 39 to 30 units once daily.  -Patient deferred meal time insulin and elected to begin Trulicity (dulaglutide).  -Started GLP-1 Trulicity (generic dulaglutide) 0.75 mg weekly.   -Continued SGLT2-I Jardiance (generic empagliflozin) 10 mg daily. Counseled on sick day rules. -Patient educated on purpose, proper use, and potential adverse effects of Trulicity.  -Extensively discussed pathophysiology of diabetes, recommended lifestyle interventions, dietary effects on blood sugar control.  -Counseled on s/sx of and management of hypoglycemia.

## 2022-12-12 NOTE — Patient Instructions (Signed)
It was great to see you again today!  Come back this afternoon to see Dr. Valentina Lucks Checking labs today - liver, kidneys, cholesterol  Flu and COVID shots today  Will have them reach out to you about scheduling Annual Wellness Visit   Be well, Dr. Ardelia Mems

## 2022-12-12 NOTE — Assessment & Plan Note (Signed)
Well-controlled, continue current regimen 

## 2022-12-12 NOTE — Assessment & Plan Note (Signed)
Uncontrolled with A1c of 7.9.  Difficult to make adjustments given his somewhat erratic sugars, and inconsistent eating habits.  I think he would benefit from meeting with our pharmacy team for more intensive diabetes.  Scheduled to see Dr. Valentina Lucks this afternoon at 1:30.

## 2022-12-12 NOTE — Progress Notes (Signed)
S:    Chief Complaint  Patient presents with   Medication Management    Diabetes   67 y.o. male who presents for diabetes evaluation, education, and management.  PMH is significant for T2DM, HTN, HLD, and CKD.  Patient was referred and last seen by Primary Care Provider, Dr. Ardelia Mems, on 12/12/2022.  At last visit, no changes were made to diabetes regimen.   Today, patient arrives in good spirits and presents without any assistance.   Patient reports Diabetes was diagnosed in 2014 and has taken insulin almost since diagnosis.    Current diabetes medications include: Lantus (insulin glargine) 39 units daily Current hypertension medications include: lisinopril 5 mg daily Current hyperlipidemia medications include: rosuvastatin 10 mg daily  Patient reports adherence to taking all medications as prescribed.   Insurance coverage: [employer]  Patient reports hypoglycemic events. Lowest 55 mg/dL ~2 weeks ago  Reported home fasting blood sugars: 70s-200s. Highest seen was 300s   Reported 2 hour post-meal/random blood sugars: 200s.  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   Physical Exam Constitutional:      Appearance: Normal appearance. He is normal weight.  Pulmonary:     Effort: Pulmonary effort is normal.  Neurological:     Mental Status: He is alert.  Psychiatric:        Mood and Affect: Mood normal.        Behavior: Behavior normal.        Thought Content: Thought content normal.     Lab Results  Component Value Date   HGBA1C 7.9 (A) 12/12/2022    Lipid Panel     Component Value Date/Time   CHOL 153 08/17/2021 0846   TRIG 61 08/17/2021 0846   HDL 38 (L)  08/17/2021 0846   CHOLHDL 4.0 08/17/2021 0846   CHOLHDL 4.4 07/22/2016 1201   VLDL 18 07/22/2016 1201   LDLCALC 103 (H) 08/17/2021 0846   LDLDIRECT 77 04/12/2021 1128    Clinical Atherosclerotic Cardiovascular Disease (ASCVD): No  The 10-year ASCVD risk score (Arnett DK, et al., 2019) is: 32.8%   Values used to calculate the score:     Age: 88 years     Sex: Male     Is Non-Hispanic African American: Yes     Diabetic: Yes     Tobacco smoker: No     Systolic Blood Pressure: 283 mmHg     Is BP treated: Yes     HDL Cholesterol: 38 mg/dL     Total Cholesterol: 153 mg/dL   A/P: Diabetes longstanding, currently slightly above goal based on A1c (7.9%). Patient is able to verbalize appropriate hypoglycemia management plan. Medication adherence appears appropriate. Control is suboptimal due to needing therapy adjustment. -Decreased dose of basal insulin Basaglar (insulin glargine) from 39 to 30 units once daily.  -Patient deferred meal time insulin and elected to begin Trulicity (dulaglutide).  -Started GLP-1 Trulicity (generic dulaglutide) 0.75 mg weekly.   -Continued SGLT2-I Jardiance (generic empagliflozin) 10 mg daily. Counseled on sick day rules. -Patient educated on purpose, proper use, and potential adverse effects of Trulicity.  -Extensively discussed pathophysiology of diabetes, recommended lifestyle interventions, dietary effects on blood sugar control.  -Counseled on s/sx of and management of  hypoglycemia.  -Next A1c anticipated 3-6 months.   Written patient instructions provided. Patient verbalized understanding of treatment plan.  Total time in face to face counseling 25 minutes.    Follow-up:  Pharmacist 01/09/2023. Patient seen with Dixon Boos,  PharmD Candidate and Joseph Art, PharmD, PGY2 Pharmacy Resident.

## 2022-12-12 NOTE — Assessment & Plan Note (Signed)
Update lipids today.  Continue statin at renal dose.

## 2022-12-12 NOTE — Patient Instructions (Signed)
It was nice to see you today!  Your goal blood sugar is 80-130 before eating and less than 180 after eating.  Medication Changes: Begin Trulicity 8.88 mg weekly.   Decrease Basaglar to 30 units once daily. You can further reduce by 2 units each day if your blood sugar is less than 80.  Continue Jardiance 10 mg daily.   Monitor blood sugars at home and keep a log (glucometer or piece of paper) to bring with you to your next visit.  Keep up the good work with diet and exercise. Aim for a diet full of vegetables, fruit and lean meats (chicken, [place], fish). Try to limit salt intake by eating fresh or frozen vegetables (instead of canned), rinse canned vegetables prior to cooking and do not add any additional salt to meals.

## 2022-12-13 LAB — LIPID PANEL
Chol/HDL Ratio: 5.2 ratio — ABNORMAL HIGH (ref 0.0–5.0)
Cholesterol, Total: 161 mg/dL (ref 100–199)
HDL: 31 mg/dL — ABNORMAL LOW (ref >39)
LDL Chol Calc (NIH): 114 mg/dL — ABNORMAL HIGH (ref 0–99)
Triglycerides: 84 mg/dL (ref 0–149)
VLDL Cholesterol Cal: 16 mg/dL (ref 5–40)

## 2022-12-13 LAB — CMP14+EGFR
ALT: 20 IU/L (ref 0–44)
AST: 19 IU/L (ref 0–40)
Albumin/Globulin Ratio: 1.3 (ref 1.2–2.2)
Albumin: 4.4 g/dL (ref 3.9–4.9)
Alkaline Phosphatase: 196 IU/L — ABNORMAL HIGH (ref 44–121)
BUN/Creatinine Ratio: 14 (ref 10–24)
BUN: 42 mg/dL — ABNORMAL HIGH (ref 8–27)
Bilirubin Total: 0.3 mg/dL (ref 0.0–1.2)
CO2: 18 mmol/L — ABNORMAL LOW (ref 20–29)
Calcium: 8.4 mg/dL — ABNORMAL LOW (ref 8.6–10.2)
Chloride: 105 mmol/L (ref 96–106)
Creatinine, Ser: 2.97 mg/dL — ABNORMAL HIGH (ref 0.76–1.27)
Globulin, Total: 3.5 g/dL (ref 1.5–4.5)
Glucose: 107 mg/dL — ABNORMAL HIGH (ref 70–99)
Potassium: 4.7 mmol/L (ref 3.5–5.2)
Sodium: 139 mmol/L (ref 134–144)
Total Protein: 7.9 g/dL (ref 6.0–8.5)
eGFR: 22 mL/min/{1.73_m2} — ABNORMAL LOW (ref >59)

## 2022-12-13 LAB — MICROALBUMIN / CREATININE URINE RATIO
Creatinine, Urine: 62.9 mg/dL
Microalb/Creat Ratio: 202 mg/g creat — ABNORMAL HIGH (ref 0–29)
Microalbumin, Urine: 127.1 ug/mL

## 2022-12-13 LAB — GAMMA GT: GGT: 28 IU/L (ref 0–65)

## 2022-12-16 NOTE — Progress Notes (Signed)
Reviewed: I agree with Dr. Koval's documentation and management. 

## 2022-12-23 DIAGNOSIS — N183 Chronic kidney disease, stage 3 unspecified: Secondary | ICD-10-CM | POA: Diagnosis not present

## 2022-12-23 DIAGNOSIS — N2581 Secondary hyperparathyroidism of renal origin: Secondary | ICD-10-CM | POA: Diagnosis not present

## 2023-01-02 DIAGNOSIS — E1122 Type 2 diabetes mellitus with diabetic chronic kidney disease: Secondary | ICD-10-CM | POA: Diagnosis not present

## 2023-01-02 DIAGNOSIS — N2581 Secondary hyperparathyroidism of renal origin: Secondary | ICD-10-CM | POA: Diagnosis not present

## 2023-01-02 DIAGNOSIS — N184 Chronic kidney disease, stage 4 (severe): Secondary | ICD-10-CM | POA: Diagnosis not present

## 2023-01-02 DIAGNOSIS — I129 Hypertensive chronic kidney disease with stage 1 through stage 4 chronic kidney disease, or unspecified chronic kidney disease: Secondary | ICD-10-CM | POA: Diagnosis not present

## 2023-01-03 ENCOUNTER — Encounter: Payer: Self-pay | Admitting: Family Medicine

## 2023-01-09 ENCOUNTER — Ambulatory Visit (INDEPENDENT_AMBULATORY_CARE_PROVIDER_SITE_OTHER): Payer: Medicare HMO | Admitting: Pharmacist

## 2023-01-09 ENCOUNTER — Other Ambulatory Visit: Payer: Self-pay | Admitting: Family Medicine

## 2023-01-09 ENCOUNTER — Encounter: Payer: Self-pay | Admitting: Pharmacist

## 2023-01-09 VITALS — BP 137/68 | HR 67 | Wt 163.0 lb

## 2023-01-09 DIAGNOSIS — Z794 Long term (current) use of insulin: Secondary | ICD-10-CM

## 2023-01-09 DIAGNOSIS — E119 Type 2 diabetes mellitus without complications: Secondary | ICD-10-CM | POA: Diagnosis not present

## 2023-01-09 MED ORDER — EMPAGLIFLOZIN 25 MG PO TABS: 25.0000 mg | ORAL_TABLET | Freq: Every day | ORAL | 3 refills | Status: DC

## 2023-01-09 MED ORDER — BASAGLAR KWIKPEN 100 UNIT/ML ~~LOC~~ SOPN: 14.0000 [IU] | PEN_INJECTOR | Freq: Every day | SUBCUTANEOUS | 2 refills | Status: DC

## 2023-01-09 NOTE — Patient Instructions (Signed)
It was nice to see you today!  Your goal blood sugar is 80-130 before eating and less than 180 after eating.  Medication Changes: Begin taking 2 tablets of Jardiance (empagliflozin) 10 mg daily. We have sent a new prescription to your pharmacy for 25 mg. You will take that once daily.   Continue basaglar (insuline glargine) at a decreased dose of 14 units daily. Start taking this in the morning to avoid nighttime lows. Continue taking Trulicity (dulaglutide) 0.75 mg injection weekly.   Monitor blood sugars at home and keep a log (glucometer or piece of paper) to bring with you to your next visit.  Keep up the good work with diet and exercise. Aim for a diet full of vegetables, fruit and lean meats (chicken, [place], fish). Try to limit salt intake by eating fresh or frozen vegetables (instead of canned), rinse canned vegetables prior to cooking and do not add any additional salt to meals.

## 2023-01-09 NOTE — Progress Notes (Signed)
Reviewed: I agree with the documentation and management of Dr. Koval. 

## 2023-01-09 NOTE — Progress Notes (Signed)
S:     Chief Complaint  Patient presents with   Medication Management    Diabetes    67 y.o. male who presents for diabetes evaluation, education, and management.  PMH is significant for type 2 diabetes, CKD, HTN and hyperlipidemia.  Patient was referred and last seen by Primary Care Provider, Dr. Ardelia Mems, on 12/12/2022 .  At last visit, we decreased his basal insulin Basaglar (insuline glargine) from 39 to 30 units once daily. Patient started GLP-1 Trulicity (dulaglutide) 0.75 mg weekly.    Today, patient arrives in good spirits and presents without any assistance. He expressed how tired he is due to work.   Patient reports Diabetes was diagnosed in 2012.  Current diabetes medications include: Trulicity (dulaglutide) 0.75 mg injection, Basaglar (insulin glargine) 20 units daily, Jardiance (empagliflozin) 10 mg daily Current hypertension medications include: amlodipine 5 mg daily, lisinopril 5 mg daily Current hyperlipidemia medications include: rosuvastatin 10 mg daily  Patient reports adherence to taking blood pressure medications and Trulicity (dulaglutide) as prescribed. He has tapered his insulin down to 20 units daily due to low blood sugar readings.  Patient reports multiple hypoglycemic events. Patient reports decreased appetite with Trulicity (dulaglutide) 0.75 mg injection. He states that he feels like he needs to eat more.   Reported home fasting blood sugars: most were <130 mg/dL. Reported 2 hour post-meal/random blood sugars: none over 200 mg/dL.  Patient denies nocturia (nighttime urination).  Patient denies neuropathy (nerve pain). Patient reports visual changes. Vision has improved recently with blood sugar control.   Patient reported dietary habits: For snacks he eats fruits and multi-grain crackers. He requests that his wife not bake sweets because they are his weakness. Patient also reports decreased appetite since starting the Trulicity (dulaglutide) 0.75 mg  injection. He states not being able to eat the same amount as usual (i.e. only eating half a sandwich for lunch) and often having to force himself to eat. This is likely contributing to his low blood sugar readings. Patient does not desire to lose any more weight (BMI 22.73 kg/m^2)   O:   Review of Systems  Constitutional:  Positive for malaise/fatigue.  All other systems reviewed and are negative.   Physical Exam Constitutional:      Appearance: Normal appearance. He is normal weight.  Cardiovascular:     Pulses: Normal pulses.     Heart sounds: Normal heart sounds.  Neurological:     Mental Status: He is alert.  Psychiatric:        Mood and Affect: Mood normal.        Behavior: Behavior normal.        Thought Content: Thought content normal.        Judgment: Judgment normal.    Lab Results  Component Value Date   HGBA1C 7.9 (A) 12/12/2022   Vitals:   01/09/23 0851 01/09/23 0906  BP: (!) 145/73 137/68  Pulse: 67   SpO2: 100%     Lipid Panel     Component Value Date/Time   CHOL 161 12/12/2022 1110   TRIG 84 12/12/2022 1110   HDL 31 (L) 12/12/2022 1110   CHOLHDL 5.2 (H) 12/12/2022 1110   CHOLHDL 4.4 07/22/2016 1201   VLDL 18 07/22/2016 1201   LDLCALC 114 (H) 12/12/2022 1110   LDLDIRECT 77 04/12/2021 1128    Clinical Atherosclerotic Cardiovascular Disease (ASCVD): No  The 10-year ASCVD risk score (Arnett DK, et al., 2019) is: 37%   Values used to calculate the score:  Age: 58 years     Sex: Male     Is Non-Hispanic African American: Yes     Diabetic: Yes     Tobacco smoker: No     Systolic Blood Pressure: 323 mmHg     Is BP treated: Yes     HDL Cholesterol: 31 mg/dL     Total Cholesterol: 161 mg/dL   A/P: Diabetes longstanding since 2012 currently improved with an A1c close to goal at 7.9% (12/12/2022) Patient is able to verbalize appropriate hypoglycemia management plan. Medication adherence appears good. Patient is in good control of blood sugars,  however has had low blood sugar readings which is cause for concern.  -Decreased dose of basal insulin Basaglar (insulin glargine) to 14 units in the AM. Patient counseled on taking insulin in the morning to avoid night-time lows.   -Continued GLP-1 Trulicity (dulaglutide) 0.75 mg injection weekly.  -Increased dose of SGLT2-I Jardiance (empagliflozin) to 25 mg daily. Counseled on sick day rules. Counseled to take 2 of his 10 mg tablet supply (20 mg daily) until out, and to then start taking the 25 mg tablet daily.  -Patient educated on purpose, proper use, and potential adverse effects. -Extensively discussed pathophysiology of diabetes, recommended lifestyle interventions, dietary effects on blood sugar control.  -Counseled on s/sx of and management of hypoglycemia.   ASCVD risk - primary prevention in patient with diabetes. Last LDL is 114 not at goal of <70 mg/dL. ASCVD risk factors include type 2 diabetes, hypertension, hyperlipidemia, and CKD and 10-year ASCVD risk score of 37% -Continued rosuvastatin 10 mg today.  -Consider statin dose increase at next visit.  Hypertension longstanding currently controlled. Blood pressure goal of <130/80 mmHg. Medication adherence appears good, however he had not taken his bp medications this morning which could explain elevated reading. 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 We did not discuss kidney function with the patient but we did assess appropriateness of current medications for renal protection.  -Continue lisinopril 5 mg daily -Increased dose of Jardiance (empagliflozin) from '10mg'$  to to 25 mg daily.   Written patient instructions provided. Patient verbalized understanding of treatment plan.  Total time in face to face counseling 30 minutes.    Follow-up:  Pharmacist in 6 weeks (02/20/2023). PCP clinic visit: none Patient seen with Dixon Boos, PharmD Candidate and Joseph Art, PharmD, PGY2 Pharmacy Resident.

## 2023-01-09 NOTE — Assessment & Plan Note (Signed)
Diabetes longstanding since 2012 currently improved with an A1c close to goal at 7.9% (12/12/2022) Patient is able to verbalize appropriate hypoglycemia management plan. Medication adherence appears good. Patient is in good control of blood sugars, however has had low blood sugar readings which is cause for concern.  -Decreased dose of basal insulin Basaglar (insulin glargine) to 14 units in the AM. Patient counseled on taking insulin in the morning to avoid night-time lows.   -Continued GLP-1 Trulicity (dulaglutide) 0.75 mg injection weekly.  -Increased dose of SGLT2-I Jardiance (empagliflozin) to 25 mg daily. Counseled on sick day rules. Counseled to take 2 of his 10 mg tablet supply (20 mg daily) until out, and to then start taking the 25 mg tablet daily.  -Patient educated on purpose, proper use, and potential adverse effects. -Extensively discussed pathophysiology of diabetes, recommended lifestyle interventions, dietary effects on blood sugar control.  -Counseled on s/sx of and management of hypoglycemia.

## 2023-01-23 ENCOUNTER — Other Ambulatory Visit: Payer: Self-pay

## 2023-01-24 MED ORDER — AMLODIPINE BESYLATE 5 MG PO TABS: 5.0000 mg | ORAL_TABLET | Freq: Every day | ORAL | 3 refills | Status: DC

## 2023-02-03 ENCOUNTER — Other Ambulatory Visit: Payer: Self-pay | Admitting: Family Medicine

## 2023-02-07 NOTE — Telephone Encounter (Signed)
Can you confirm with patient that he is actually taking this medication? It is not on his current medication list Thanks! Leeanne Rio, MD

## 2023-02-11 ENCOUNTER — Telehealth: Payer: Self-pay | Admitting: *Deleted

## 2023-02-11 NOTE — Telephone Encounter (Signed)
Per pt he is still currently taking it. Richard Gill Kennon Holter, CMA

## 2023-02-11 NOTE — Progress Notes (Signed)
  Care Coordination  Outreach Note  02/11/2023 Name: Richard Gill. MRN: [id number] DOB: 11/13/1956   Care Coordination Outreach Attempts: An unsuccessful telephone outreach was attempted today to offer the patient information about available care coordination services as a benefit of their health plan.   Follow Up Plan:  Additional outreach attempts will be made to offer the patient care coordination information and services.   Encounter Outcome:  No Answer  [place]  Direct Dial: [phone]

## 2023-02-17 NOTE — Progress Notes (Signed)
  Care Coordination   Note   02/17/2023 Name: Rashawd Laskaris. MRN: [id number] DOB: Nov 25, 1956  Joziyah Roblero. is a 67 y.o. year old male who sees Ardelia Mems, Delorse Limber, MD for primary care. I reached out to [name]. by phone today to offer care coordination services.  Mr. Hakim was given information about Care Coordination services today including:   The Care Coordination services include support from the care team which includes your Nurse Coordinator, Clinical Social Worker, or Pharmacist.  The Care Coordination team is here to help remove barriers to the health concerns and goals most important to you. Care Coordination services are voluntary, and the patient may decline or stop services at any time by request to their care team member.   Care Coordination Consent Status: Patient agreed to services and verbal consent obtained.   Follow up plan:  Telephone appointment with care coordination team member scheduled for:  02/25/23  Encounter Outcome:  Pt. Scheduled  [place]  Direct Dial: [phone]

## 2023-02-19 ENCOUNTER — Telehealth: Payer: Self-pay

## 2023-02-19 ENCOUNTER — Other Ambulatory Visit (HOSPITAL_COMMUNITY): Payer: Self-pay

## 2023-02-19 NOTE — Telephone Encounter (Signed)
A Prior Authorization was initiated for this patients HYDROXYZINE TABS through CoverMyMeds.   Key: [id number]

## 2023-02-20 ENCOUNTER — Encounter: Payer: Self-pay | Admitting: Pharmacist

## 2023-02-20 ENCOUNTER — Ambulatory Visit (INDEPENDENT_AMBULATORY_CARE_PROVIDER_SITE_OTHER): Payer: Medicare HMO | Admitting: Pharmacist

## 2023-02-20 VITALS — BP 134/76 | HR 68 | Wt 152.6 lb

## 2023-02-20 DIAGNOSIS — Z794 Long term (current) use of insulin: Secondary | ICD-10-CM

## 2023-02-20 DIAGNOSIS — E785 Hyperlipidemia, unspecified: Secondary | ICD-10-CM

## 2023-02-20 DIAGNOSIS — I10 Essential (primary) hypertension: Secondary | ICD-10-CM

## 2023-02-20 DIAGNOSIS — E119 Type 2 diabetes mellitus without complications: Secondary | ICD-10-CM

## 2023-02-20 MED ORDER — ATORVASTATIN CALCIUM 40 MG PO TABS: 40.0000 mg | ORAL_TABLET | Freq: Every day | ORAL | 3 refills | Status: DC

## 2023-02-20 MED ORDER — BASAGLAR KWIKPEN 100 UNIT/ML ~~LOC~~ SOPN: 14.0000 [IU] | PEN_INJECTOR | Freq: Every day | SUBCUTANEOUS | 1 refills | Status: DC

## 2023-02-20 NOTE — Telephone Encounter (Signed)
Prior Auth for patients medication HYDROXYZINE approved by [facility] [employer] from 02/19/23 to 12/09/23.  CoverMyMeds Key: [id number] [place] Case ID #: [id number]

## 2023-02-20 NOTE — Assessment & Plan Note (Signed)
Diabetes longstanding since 2012 currently improved with an A1c close to goal at 7.9% (12/12/2022). Patient is able to verbalize appropriate hypoglycemia management plan. Medication adherence appears good. Discussed the possibility of switching Trulicity (dulaglutide) to Ozempic (semaglutide) as Ozempic is a titratable pen, but given BMI (~21), will defer.  -Adjust basal insulin Basaglar (insulin glargine) to 14 units QAM if fasting BG <200 mg/dL and 16 units QAM if fasting BG >200 mg/dL. -Continued GLP-1 Trulicity (dulaglutide) 0.75 mg injection weekly. Unable to titrate due to tolerability.  -Continued SGLT2-I Jardiance (empagliflozin) to 25 mg daily. Counseled on sick day rules. -Patient educated on purpose, proper use, and potential adverse effects. -Extensively discussed pathophysiology of diabetes, recommended lifestyle interventions, dietary effects on blood sugar control.  -Counseled on s/sx of and management of hypoglycemia.

## 2023-02-20 NOTE — Patient Instructions (Addendum)
It was nice to see you today!  Your goal blood sugar is 80-130 before eating and less than 180 after eating.  Medication Changes: If your blood sugar is <200, take 14 units daily. If blood sugar is >200, take 16 units daily.  Please pick up your new cholesterol medicine, rosuvastatin. You can stop taking atorvastatin.   Monitor blood sugars at home and keep a log (glucometer or piece of paper) to bring with you to your next visit.  Keep up the good work with diet and exercise. Aim for a diet full of vegetables, fruit and lean meats (chicken, [place], fish). Try to limit salt intake by eating fresh or frozen vegetables (instead of canned), rinse canned vegetables prior to cooking and do not add any additional salt to meals.

## 2023-02-20 NOTE — Progress Notes (Signed)
S:    Chief Complaint  Patient presents with   Medication Management    Diabetes   67 y.o. male who presents for diabetes evaluation, education, and management.  PMH is significant for type 2 diabetes, CKD, HTN and hyperlipidemia.  Patient was referred and last seen by Primary Care Provider, Dr. Ardelia Mems, on 12/12/2022. Last seen by pharmacy clinic on 01/09/2023 where Basaglar (insuline glargine) was reduced from 30 units to 14 units daily due to frequent hypoglycemia events and Jardiance (empagliflozin) was increased from 10 mg to 25 mg given concurrent CKD.    Today, patient arrives in good spirits and presents without any assistance. His nausea with Trulicity has finally resolved. He has increased his protein intake through Ensure shakes as recommended at last visit. Of note, he has lost ~10 lbs since his last visit. BMI remains normal (~21).   Patient reports Diabetes was diagnosed in 2012.  Current diabetes medications include: Trulicity (dulaglutide) 0.75 mg injection, Basaglar (insulin glargine) 14 units daily, Jardiance (empagliflozin) 25 mg daily Current hypertension medications include: amlodipine 5 mg daily, lisinopril 5 mg daily Current hyperlipidemia medications include: rosuvastatin 10 mg daily  Patient reports adherence to all medications.   Patient reports 1 hypoglycemic event, 67; although weeks ago. He does endorse relative hypoglycemia when his BG is <80.   Reported home fasting blood sugars: most were 150s, 200s a few time, this morning 240 (had a couple donuts last night) Reported 2 hour post-meal/random blood sugars: mostly 100s, a few >200  Patient denies nocturia (nighttime urination).  Patient denies neuropathy (nerve pain). Patient reports visual changes. Vision has improved recently with blood sugar control.   Patient reported dietary habits: For snacks he eats fruits and multi-grain crackers. He requests that his wife not bake sweets because they are his  weakness. Patient also reports decreased appetite since starting the Trulicity (dulaglutide) 0.75 mg injection, although appetite has improved since nausea has subsided. Patient does not desire to lose any more weight (BMI 21 kg/m^2)   O:  Review of Systems  All other systems reviewed and are negative.   Physical Exam Constitutional:      Appearance: Normal appearance. He is normal weight.  Cardiovascular:     Pulses: Normal pulses.     Heart sounds: Normal heart sounds.  Neurological:     Mental Status: He is alert.  Psychiatric:        Mood and Affect: Mood normal.        Behavior: Behavior normal.        Thought Content: Thought content normal.        Judgment: Judgment normal.    Lab Results  Component Value Date   HGBA1C 7.9 (A) 12/12/2022   Vitals:   02/20/23 0833 02/20/23 0847  BP: (!) 140/80 134/76  Pulse: 68   SpO2: 100%     Lipid Panel     Component Value Date/Time   CHOL 161 12/12/2022 1110   TRIG 84 12/12/2022 1110   HDL 31 (L) 12/12/2022 1110   CHOLHDL 5.2 (H) 12/12/2022 1110   CHOLHDL 4.4 07/22/2016 1201   VLDL 18 07/22/2016 1201   LDLCALC 114 (H) 12/12/2022 1110   LDLDIRECT 77 04/12/2021 1128    Clinical Atherosclerotic Cardiovascular Disease (ASCVD): No  The 10-year ASCVD risk score (Arnett DK, et al., 2019) is: 35.8%   Values used to calculate the score:     Age: 89 years     Sex: Male  Is Non-Hispanic African American: Yes     Diabetic: Yes     Tobacco smoker: No     Systolic Blood Pressure: [id number] mmHg     Is BP treated: Yes     HDL Cholesterol: 31 mg/dL     Total Cholesterol: 161 mg/dL   A/P: Diabetes longstanding since 2012 currently improved with an A1c close to goal at 7.9% (12/12/2022). Patient is able to verbalize appropriate hypoglycemia management plan. Medication adherence appears good. Discussed the possibility of switching Trulicity (dulaglutide) to Ozempic (semaglutide) as Ozempic is a titratable pen, but given BMI (~21), will  defer.  -Adjust basal insulin Basaglar (insulin glargine) to 14 units QAM if fasting BG <200 mg/dL and 16 units QAM if fasting BG >200 mg/dL. -Continued GLP-1 Trulicity (dulaglutide) 0.75 mg injection weekly. Unable to titrate due to tolerability.  -Continued SGLT2-I Jardiance (empagliflozin) to 25 mg daily. Counseled on sick day rules. -Patient educated on purpose, proper use, and potential adverse effects. -Extensively discussed pathophysiology of diabetes, recommended lifestyle interventions, dietary effects on blood sugar control.  -Counseled on s/sx of and management of hypoglycemia.   ASCVD risk - primary prevention in patient with diabetes. Last LDL is 114 not at goal of <70 mg/dL. ASCVD risk factors include type 2 diabetes, hypertension, hyperlipidemia, and CKD and 10-year ASCVD risk score of 37%. Unable to increase rosuvastatin dose due to renal function. Will switch to atorvastatin as atorvastatin does not require a renal dose adjustment.  -Discontinued rosuvastatin 10 mg today.  -Start atorvastatin 40 mg daily. Will plan to increase at follow up if LDL is not at goal.   Hypertension longstanding currently slightly above goal. Blood pressure goal of <130/80 mmHg. Medication adherence appears appropriate and patient has taken his BP medications this morning. UACR elevated at 202. Will plan to repeat UACR in 2-3 months. If UACR remains >200, recommend switching from lisinopril 5 mg daily to losartan 50 mg daily.  -Continued amlodipine 5 mg daily -Continued lisinopril 5 mg daily   Written patient instructions provided. Patient verbalized understanding of treatment plan.  Total time in face to face counseling 30 minutes.    Follow-up:  Pharmacist in 6 weeks Louanne Belton PharmD PGY-1 Pharmacy Resident and Joseph Art, PharmD, PGY2 Pharmacy Resident.

## 2023-02-20 NOTE — Assessment & Plan Note (Signed)
ASCVD risk - primary prevention in patient with diabetes. Last LDL is 114 not at goal of <70 mg/dL. ASCVD risk factors include type 2 diabetes, hypertension, hyperlipidemia, and CKD and 10-year ASCVD risk score of 37%. Unable to increase rosuvastatin dose due to renal function. Will switch to atorvastatin as atorvastatin does not require a renal dose adjustment.  -Discontinued rosuvastatin 10 mg today.  -Start atorvastatin 40 mg daily. Will plan to increase at follow up if LDL is not at goal.

## 2023-02-20 NOTE — Assessment & Plan Note (Signed)
Hypertension longstanding currently slightly above goal. Blood pressure goal of <130/80 mmHg. Medication adherence appears appropriate and patient has taken his BP medications this morning. UACR elevated at 202. Will plan to repeat UACR in 2-3 months. If UACR remains >200, recommend switching from lisinopril 5 mg daily to losartan 50 mg daily.  -Continued amlodipine 5 mg daily -Continued lisinopril 5 mg daily

## 2023-02-21 NOTE — Progress Notes (Signed)
Reviewed and agree with Dr Koval's plan.   

## 2023-02-25 ENCOUNTER — Ambulatory Visit: Payer: Self-pay

## 2023-02-25 NOTE — Patient Instructions (Signed)
Visit Information  Thank you for taking time to visit with me today. Please don't hesitate to contact me if I can be of assistance to you.   Following are the goals we discussed today:   Goals Addressed             This Visit's Progress    COMPLETED: Care Coordination Activites - no follow up required        Care Coordination Interventions: Active listening / Reflection utilized  Emotional Support Provided Problem Solving /Task Center strategies reviewed Discussed/.Educated Care Coordination Program 2.   Discussed/.Educated Annual Wellness Visit 3.   Discussed/.Educated Social Determinates of Health 4.   Please inform PCP if services needed in the future          If you are experiencing a Mental Health or Behavioral Health Crisis or need someone to talk to, please call 1-800-273-TALK (toll free, 24 hour hotline)   The patient verbalized understanding of instructions, educational materials, and care plan provided today and agreed to receive a mailed copy of patient instructions, educational materials, and care plan.   No further follow up required   Shiri Hodapp RN, BSN, CPC Care Coordinator [facility]   Phone: [phone]       

## 2023-02-25 NOTE — Patient Outreach (Signed)
  Care Coordination   Initial Visit Note   02/25/2023 Name: Richard Gill. MRN: [id number] DOB: [birth year]  Richard Gill. is a 67 y.o. year old male who sees Ardelia Mems, Delorse Limber, MD for primary care. I spoke with  [name]. by phone today.  What matters to the patients health and wellness today?  The patient has no needs or concerns at this time.  He has his physicians to help him with his care.   Goals Addressed             This Visit's Progress    COMPLETED: Care Coordination Activites- no follow up required        Care Coordination Interventions: Active listening / Reflection utilized  Emotional Support Provided Problem [place] strategies reviewed Discussed/.Educated Care Coordination Program 2.   Discussed/.Educated Annual Wellness Visit 3.   Discussed/.Educated Social Determinates of Health 4.   Please inform PCP if services needed in the future         SDOH assessments and interventions completed:  Yes  SDOH Interventions Today    Flowsheet Row Most Recent Value  SDOH Interventions   Food Insecurity Interventions Intervention Not Indicated  Transportation Interventions Intervention Not Indicated        Care Coordination Interventions:  Yes, provided   Follow up plan: No further intervention required.   Encounter Outcome:  Pt. Visit Completed   Lazaro Arms RN, BSN, [facility]   Phone: [phone]

## 2023-03-26 ENCOUNTER — Telehealth: Payer: Self-pay

## 2023-03-26 NOTE — Telephone Encounter (Signed)
Patient calls nurse line to report that he is stopping Trulicity. Last dose was a few weeks ago.   He reports that he had nausea from Trulicity and was missing work because of this. Reports that recent blood sugars have been ranging between 120-140. Did report low of 60 last night. He was asymptomatic and took glucose tablets and blood sugar normalized.   Forwarding to PCP and Dr. Raymondo Band.   Veronda Prude, RN

## 2023-03-28 NOTE — Telephone Encounter (Signed)
Noted. I see he has follow up with Dr. Raymondo Band in a few weeks. Can be discussed with patient then.  Latrelle Dodrill, MD

## 2023-03-31 DIAGNOSIS — N183 Chronic kidney disease, stage 3 unspecified: Secondary | ICD-10-CM | POA: Diagnosis not present

## 2023-03-31 DIAGNOSIS — N1831 Chronic kidney disease, stage 3a: Secondary | ICD-10-CM | POA: Diagnosis not present

## 2023-04-10 ENCOUNTER — Ambulatory Visit (INDEPENDENT_AMBULATORY_CARE_PROVIDER_SITE_OTHER): Payer: Medicare HMO | Admitting: Pharmacist

## 2023-04-10 ENCOUNTER — Encounter: Payer: Self-pay | Admitting: Pharmacist

## 2023-04-10 VITALS — BP 135/67 | HR 63 | Ht 71.5 in | Wt 155.8 lb

## 2023-04-10 DIAGNOSIS — Z794 Long term (current) use of insulin: Secondary | ICD-10-CM | POA: Diagnosis not present

## 2023-04-10 DIAGNOSIS — E119 Type 2 diabetes mellitus without complications: Secondary | ICD-10-CM

## 2023-04-10 DIAGNOSIS — I10 Essential (primary) hypertension: Secondary | ICD-10-CM | POA: Diagnosis not present

## 2023-04-10 DIAGNOSIS — E785 Hyperlipidemia, unspecified: Secondary | ICD-10-CM

## 2023-04-10 LAB — POCT GLYCOSYLATED HEMOGLOBIN (HGB A1C): HbA1c, POC (controlled diabetic range): 7.3 % — AB (ref 0.0–7.0)

## 2023-04-10 MED ORDER — BASAGLAR KWIKPEN 100 UNIT/ML ~~LOC~~ SOPN: 16.0000 [IU] | PEN_INJECTOR | Freq: Every day | SUBCUTANEOUS | 1 refills | Status: DC

## 2023-04-10 NOTE — Assessment & Plan Note (Signed)
Hypertension longstanding currently near goal of <130/80 mmHg. Medication adherence appears good. Blood pressure control could be improved with increase in ACE lisinopril or switch/increase to ARB (suggest Losartan 50mg ). . -Continued lisinopril 5mg  at this time - reevaluate next PCP visit.

## 2023-04-10 NOTE — Assessment & Plan Note (Signed)
-  Diabetes longstanding since 2012 currently improved today with an A1c of 7.3% . Patient is able to verbalize appropriate hypoglycemia management plan. Medication adherence appears good. Since last visit patient has stopped Trulicity due to nausea (added to intolerance).  -Continue Basal insulin Basaglar (insulin glargine) at 16 units QAM  -Continued SGLT2-I Jardiance (empagliflozin) to 25 mg daily. Counseled on sick day rules. -Patient educated on purpose, proper use, and potential adverse effects. -Extensively discussed pathophysiology of diabetes, recommended lifestyle interventions, dietary effects on blood sugar control.  -Counseled on s/sx of and management of hypoglycemia.

## 2023-04-10 NOTE — Progress Notes (Addendum)
S:     Chief Complaint  Patient presents with   Medication Management    Diabetes    67 y.o. male who presents for diabetes evaluation, education, and management.  PMH is significant for type 2 diabetes, CKD, HTN and hyperlipidemia.  Patient was referred and last seen by Primary Care Provider, Dr. Pollie Meyer, on 12/12/2022.  At last visit, tolerability of Trulicity(dulaglutide) was discussed.   Today, patient arrives in good spirits and presents without any assistance.   Patient reports Diabetes was diagnosed in 2012.    Current diabetes medications include: Basaglar (insulin glargine) 14 units daily, Jardiance (empagliflozin) 25 mg daily  Current hypertension medications include: amlodipine 5 mg daily, lisinopril 5 mg daily  Current hyperlipidemia medications include: atorvastatin 40mg    Patient reports adherence to taking all medications as prescribed.   Do you feel that your medications are working for you? yes Have you been experiencing any side effects to the medications prescribed? no Do you have any problems obtaining medications due to transportation or finances? no Insurance coverage: [employer]  Patient reports infrequent hypoglycemic events which occur when he skips a meal or snack.   Reported home blood sugars: majority of readings 100-199 with infrequent readings > 200 or < 100.    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    O:   Review of Systems  All other systems reviewed and are negative.   Physical Exam Constitutional:      Appearance: Normal appearance. He is normal weight.  Pulmonary:     Effort: Pulmonary effort is normal.  Neurological:     Mental Status: He is alert.  Psychiatric:        Mood and Affect: Mood normal.        Behavior: Behavior normal.      Lab Results  Component Value Date   HGBA1C 7.3 (A) 04/10/2023   Vitals:   04/10/23 0901  BP: 135/67  Pulse: 63  SpO2: 100%    Lipid Panel      Component Value Date/Time   CHOL 161 12/12/2022 1110   TRIG 84 12/12/2022 1110   HDL 31 (L) 12/12/2022 1110   CHOLHDL 5.2 (H) 12/12/2022 1110   CHOLHDL 4.4 07/22/2016 1201   VLDL 18 07/22/2016 1201   LDLCALC 114 (H) 12/12/2022 1110   LDLDIRECT 77 04/12/2021 1128    Clinical Atherosclerotic Cardiovascular Disease (ASCVD): No  The 10-year ASCVD risk score (Arnett DK, et al., 2019) is: 36.2%   Values used to calculate the score:     Age: 62 years     Sex: Male     Is Non-Hispanic African American: Yes     Diabetic: Yes     Tobacco smoker: No     Systolic Blood Pressure: 135 mmHg     Is BP treated: Yes     HDL Cholesterol: 31 mg/dL     Total Cholesterol: 161 mg/dL   A/P: -Diabetes longstanding since 2012 currently improved today with an A1c of 7.3% . Patient is able to verbalize appropriate hypoglycemia management plan. Medication adherence appears good. Since last visit patient has stopped Trulicity due to nausea (added to intolerance).  -Continue Basal insulin Basaglar (insulin glargine) at 16 units QAM  -Continued SGLT2-I Jardiance (empagliflozin) to 25 mg daily. Counseled on sick day rules. -Patient educated on purpose, proper use, and potential adverse effects. -Extensively discussed pathophysiology of diabetes, recommended lifestyle interventions, dietary effects on blood sugar control.  -Counseled on s/sx of and management  of hypoglycemia.   ASCVD risk - primary prevention in patient with diabetes. Last LDL was 114 and  not at goal of <70 mg/dL at that time patient was switched from rosuvastatin 10mg  to atorvastatin 40mg  10-year ASCVD risk score of 36%  - Obtained LDL - direct today.  high intensity statin indicated.  -Continued atorvastatin 40 mg -  discussed possible dose increase after result of direct LDL.  Hypertension longstanding currently near goal of <130/80 mmHg. Medication adherence appears good. Blood pressure control could be improved with increase in ACE  lisinopril or switch/increase to ARB (suggest Losartan 50mg ). . -Continued lisinopril 5mg  at this time - reevaluate next PCP visit.   Written patient instructions provided. Patient verbalized understanding of treatment plan.  Total time in face to face counseling 23 minutes.    Follow-up:  Pharmacist PRN. PCP clinic visit in 05/26/2023.  Patient seen with Haze Boyden PharmD Candidate and Bing Plume, PharmD Candidate.   LDL 107 - Attempted phone contact with patient X3 without success.  Code expired on MyChart Access  Consider dose increase of atorvastatin at next PCP visit to achieve LDL < 100

## 2023-04-10 NOTE — Progress Notes (Signed)
Reviewed and agree with Dr Koval's plan.   

## 2023-04-10 NOTE — Assessment & Plan Note (Signed)
ASCVD risk - primary prevention in patient with diabetes. Last LDL was 114 and  not at goal of <70 mg/dL at that time patient was switched from rosuvastatin 10mg  to atorvastatin 40mg  10-year ASCVD risk score of 36%  - Obtained LDL - direct today.  high intensity statin indicated.  -Continued atorvastatin 40 mg -  discussed possible dose increase after result of direct LDL.

## 2023-04-10 NOTE — Patient Instructions (Addendum)
It was nice to see you today!  Your goal blood sugar is 80-130 before eating and less than 180 after eating.  Your A1C was 7.3  Medication Changes: Continue basaglar 16 units daily and Jardiance 25mg   Monitor blood sugars at home and keep a log (glucometer or piece of paper) to bring with you to your next visit.  Keep up the good work with diet and exercise. Aim for a diet full of vegetables, fruit and lean meats (chicken, [place], fish). Try to limit salt intake by eating fresh or frozen vegetables (instead of canned), rinse canned vegetables prior to cooking and do not add any additional salt to meals.

## 2023-04-11 DIAGNOSIS — E1122 Type 2 diabetes mellitus with diabetic chronic kidney disease: Secondary | ICD-10-CM | POA: Diagnosis not present

## 2023-04-11 DIAGNOSIS — N184 Chronic kidney disease, stage 4 (severe): Secondary | ICD-10-CM | POA: Diagnosis not present

## 2023-04-11 DIAGNOSIS — N2581 Secondary hyperparathyroidism of renal origin: Secondary | ICD-10-CM | POA: Diagnosis not present

## 2023-04-11 DIAGNOSIS — I129 Hypertensive chronic kidney disease with stage 1 through stage 4 chronic kidney disease, or unspecified chronic kidney disease: Secondary | ICD-10-CM | POA: Diagnosis not present

## 2023-04-11 LAB — LDL CHOLESTEROL, DIRECT: LDL Direct: 107 mg/dL — ABNORMAL HIGH (ref 0–99)

## 2023-04-28 DIAGNOSIS — R6889 Other general symptoms and signs: Secondary | ICD-10-CM | POA: Diagnosis not present

## 2023-04-28 DIAGNOSIS — N400 Enlarged prostate without lower urinary tract symptoms: Secondary | ICD-10-CM | POA: Diagnosis not present

## 2023-04-28 DIAGNOSIS — R911 Solitary pulmonary nodule: Secondary | ICD-10-CM | POA: Diagnosis not present

## 2023-04-28 DIAGNOSIS — D472 Monoclonal gammopathy: Secondary | ICD-10-CM | POA: Diagnosis not present

## 2023-04-28 DIAGNOSIS — C9000 Multiple myeloma not having achieved remission: Secondary | ICD-10-CM | POA: Diagnosis not present

## 2023-04-28 DIAGNOSIS — R3129 Other microscopic hematuria: Secondary | ICD-10-CM | POA: Diagnosis not present

## 2023-04-28 DIAGNOSIS — Z0000 Encounter for general adult medical examination without abnormal findings: Secondary | ICD-10-CM | POA: Diagnosis not present

## 2023-04-28 DIAGNOSIS — N184 Chronic kidney disease, stage 4 (severe): Secondary | ICD-10-CM | POA: Diagnosis not present

## 2023-05-20 ENCOUNTER — Other Ambulatory Visit: Payer: Self-pay

## 2023-05-20 ENCOUNTER — Other Ambulatory Visit: Payer: Self-pay | Admitting: Urology

## 2023-05-20 DIAGNOSIS — N281 Cyst of kidney, acquired: Secondary | ICD-10-CM

## 2023-05-22 NOTE — Progress Notes (Addendum)
I connected with  Richard Gill. on 05/23/2023 by a audio enabled telemedicine application and verified that I am speaking with the correct person using two identifiers.  Patient Location: Home  Provider Location: Home Office  I discussed the limitations of evaluation and management by telemedicine. The patient expressed understanding and agreed to proceed.   Subjective:   Richard Gill. is a 67 y.o. male who presents for Medicare Annual/Subsequent preventive examination.  Review of Systems    Per HPI unless specifically indicated below.        Objective:    Today's Vitals   05/23/23 0937  BP: 127/73   There is no height or weight on file to calculate BMI.     05/23/2023    9:47 AM 04/25/2022    9:40 AM 09/19/2021    1:05 PM 09/07/2021    7:16 AM 08/31/2021    9:28 AM 08/02/2021    9:53 AM 06/19/2021    9:27 AM  Advanced Directives  Does Patient Have a Medical Advance Directive? No No No No No No No  Would patient like information on creating a medical advance directive? No - Patient declined No - Patient declined No - Patient declined No - Patient declined No - Patient declined No - Patient declined No - Patient declined    Current Medications (verified) Outpatient Encounter Medications as of 05/23/2023  Medication Sig   amLODipine (NORVASC) 5 MG tablet Take 1 tablet (5 mg total) by mouth at bedtime.   atorvastatin (LIPITOR) 40 MG tablet Take 1 tablet (40 mg total) by mouth daily.   empagliflozin (JARDIANCE) 25 MG TABS tablet Take 1 tablet (25 mg total) by mouth daily.   hydrOXYzine (ATARAX) 25 MG tablet Take 1 tablet (25 mg total) by mouth daily as needed for itching.   Insulin Glargine (BASAGLAR KWIKPEN) 100 UNIT/ML Inject 16 Units into the skin daily.   Insulin Pen Needle (RELION PEN NEEDLES) 31G X 6 MM MISC USE 1  ONCE DAILY   lisinopril (ZESTRIL) 5 MG tablet Take 5 mg by mouth daily.   No facility-administered encounter medications on file as of 05/23/2023.     Allergies (verified) Smz-tmp ds [sulfamethoxazole-trimethoprim], Magnesium-containing compounds, and Trulicity [dulaglutide]   History: Past Medical History:  Diagnosis Date   BPH (benign prostatic hyperplasia)    Chronic kidney disease, stage II (mild)    Dr. Hyman Hopes follows- holding steady   Hyperlipidemia    MGUS (monoclonal gammopathy of unknown significance) 04/2012   cytogenetics on 04/20/13 was normal.    Prostatitis    Sickle cell trait (HCC)    T2DM (type 2 diabetes mellitus) (HCC) 2005   lantus x1 year   Past Surgical History:  Procedure Laterality Date   BUNIONECTOMY Bilateral 08-25-13   COLONOSCOPY  2014   Dr. Elnoria Howard, was told to f/u in 2017-2019   CYSTOSCOPY WITH FULGERATION N/A 09/07/2021   Procedure: CYSTOSCOPY WITH FULGERATION;  Surgeon: Sondra Come, MD;  Location: [facility];  Service: Urology;  Laterality: N/A;   HOLEP-LASER ENUCLEATION OF THE PROSTATE WITH MORCELLATION N/A 09/07/2021   Procedure: HOLEP-LASER ENUCLEATION OF THE PROSTATE WITH MORCELLATION;  Surgeon: Sondra Come, MD;  Location: [facility];  Service: Urology;  Laterality: N/A;   TRANSURETHRAL RESECTION OF PROSTATE N/A 09/06/2013   Procedure: TRANSURETHRAL RESECTION OF THE PROSTATE WITH GYRUS INSTRUMENTS;  Surgeon: Garnett Farm, MD;  Location: [facility];  Service: Urology;  Laterality: N/A;   Family History  Problem Relation Age of Onset  Heart attack Father 38   Stroke Mother    Colon cancer Sister 52   Colon cancer Sister 13   Social History   Socioeconomic History   Marital status: Married    Spouse name: Silvio Pate    Number of children: 8   Years of education: 12   Highest education level: 12th grade  Occupational History    Employer: [employer]IES    Comment: Building maintenance  Tobacco Use   Smoking status: Former    Packs/day: 1.00    Years: 10.00    Additional pack years: 0.00    Total pack years: 10.00    Types: Cigarettes    Quit date: 01/28/1991    Years since  quitting: 32.3   Smokeless tobacco: Never  Vaping Use   Vaping Use: Never used  Substance and Sexual Activity   Alcohol use: No   Drug use: No   Sexual activity: Yes    Partners: Female    Birth control/protection: Post-menopausal  Other Topics Concern   Not on file  Social History Narrative   Lives with wife [name] in [place].     Enjoys fishing, gardening, working on classic cars, and spending time with family.    Patient has 8 children combined with his wife.    Patient has 1 fish and 1 dog.   High school graduate. Former smoker, quit >20 years ago. No drugs or EtOH. No regular exercise.   Social Determinants of Health   Financial Resource Strain: Low Risk  (05/23/2023)   Overall Financial Resource Strain (CARDIA)    Difficulty of Paying Living Expenses: Not hard at all  Food Insecurity: No Food Insecurity (05/23/2023)   Hunger Vital Sign    Worried About Running Out of Food in the Last Year: Never true    Ran Out of Food in the Last Year: Never true  Transportation Needs: No Transportation Needs (05/23/2023)   PRAPARE - Administrator, Civil Service (Medical): No    Lack of Transportation (Non-Medical): No  Physical Activity: Inactive (05/23/2023)   Exercise Vital Sign    Days of Exercise per Week: 0 days    Minutes of Exercise per Session: 0 min  Stress: No Stress Concern Present (05/23/2023)   [employer] of Occupational Health - Occupational Stress Questionnaire    Feeling of Stress : Not at all  Social Connections: Socially Integrated (05/23/2023)   Social Connection and Isolation Panel [NHANES]    Frequency of Communication with Friends and Family: Three times a week    Frequency of Social Gatherings with Friends and Family: More than three times a week    Attends Religious Services: More than 4 times per year    Active Member of [employer] or Organizations: Yes    Attends Banker Meetings: Never    Marital Status: Married    Tobacco  Counseling Counseling given: Not Answered   Clinical Intake:  Pre-visit preparation completed: No  Pain : No/denies pain     Nutritional Status: BMI of 19-24  Normal Nutritional Risks: None Diabetes: Yes CBG done?: Yes CBG resulted in Enter/ Edit results?: No Did pt. bring in CBG monitor from home?: No  How often do you need to have someone help you when you read instructions, pamphlets, or other written materials from your doctor or pharmacy?: 1 - Never  Diabetic?Nutrition Risk Assessment:  Has the patient had any N/V/D within the last 2 months?  No  Does the patient have  any non-healing wounds?  No  Has the patient had any unintentional weight loss or weight gain?  No   Diabetes:  Is the patient diabetic?  Yes  If diabetic, was a CBG obtained today?  Yes  Did the patient bring in their glucometer from home?  No  How often do you monitor your CBG's? Twice daily .   Financial Strains and Diabetes Management:  Are you having any financial strains with the device, your supplies or your medication? No .  Does the patient want to be seen by Chronic Care Management for management of their diabetes?  No  Would the patient like to be referred to a Nutritionist or for Diabetic Management?  No   Diabetic Exams:  Diabetic Eye Exam: Completed 06/25/2022 Diabetic Foot Exam: Overdue, Pt has been advised about the importance in completing this exam. Pt is scheduled for diabetic foot exam on at next diabetic exam .   Interpreter Needed?: No  Information entered by :: Laurel Dimmer, CMA   Activities of Daily Living    05/23/2023    9:41 AM 05/23/2023    9:40 AM  In your present state of health, do you have any difficulty performing the following activities:  Hearing? 0 0  Vision? 1 1  Difficulty concentrating or making decisions? 0   Walking or climbing stairs? 0   Dressing or bathing? 0   Doing errands, shopping? 0     Patient Care Team: Latrelle Dodrill, MD as  PCP - General (Pediatrics) Raye Sorrow, MD as PCP - Hematology/Oncology (Internal Medicine) Elvis Coil, MD (Nephrology) Exie Parody, MD (Hematology and Oncology) Ihor Gully, MD (Inactive) as Attending Physician (Urology) Jeani Hawking, MD as Attending Physician (Gastroenterology)  Indicate any recent Medical Services you may have received from other than [facility] providers in the past year (date may be approximate).     Assessment:   This is a routine wellness examination for Selim.   Hearing/Vision screen Denies any hearing issues. Denies any change to her vision. Wear glasses. Annual Eye Exam.   Dietary issues and exercise activities discussed: Current Exercise Habits: The patient does not participate in regular exercise at present, Exercise limited by: None identified   Goals Addressed   None    Depression Screen    05/23/2023    9:41 AM 05/23/2023    9:40 AM 12/12/2022    9:23 AM 08/02/2021    9:46 AM 06/19/2021    9:27 AM 04/18/2021   11:28 AM 04/12/2021   10:25 AM  PHQ 2/9 Scores  PHQ - 2 Score 0 0 0 0 0 0 0  PHQ- 9 Score 0  0 0 0  0    Fall Risk    05/23/2023    9:40 AM 12/12/2022    9:24 AM 08/02/2021    9:46 AM 06/19/2021    9:27 AM 04/18/2021   11:31 AM  Fall Risk   Falls in the past year? 0 0 0 0 0  Number falls in past yr: 0 0 0 0   Injury with Fall? 0 0 0 0   Risk for fall due to : No Fall Risks      Follow up Falls evaluation completed Falls evaluation completed   Falls prevention discussed    FALL RISK PREVENTION PERTAINING TO THE HOME:  Any stairs in or around the home? No  If so, are there any without handrails? No  Home free of loose throw rugs in walkways, pet beds,  electrical cords, etc? Yes  Adequate lighting in your home to reduce risk of falls? Yes   ASSISTIVE DEVICES UTILIZED TO PREVENT FALLS:  Life alert? No  Use of a cane, walker or w/c? No  Grab bars in the bathroom? No  Shower chair or bench in shower? No  Elevated toilet seat or  a handicapped toilet? Yes   TIMED UP AND GO:  Was the test performed? Unable to perform, virtual appointment   Cognitive Function:        05/23/2023    9:43 AM 04/18/2021   11:32 AM  6CIT Screen  What Year? 0 points 0 points  What month? 0 points 0 points  What time? 0 points 0 points  Count back from 20 0 points 0 points  Months in reverse 0 points 0 points  Repeat phrase 0 points 0 points  Total Score 0 points 0 points    Immunizations Immunization History  Administered Date(s) Administered   COVID-19, mRNA, vaccine(Comirnaty)12 years and older 12/12/2022   Fluad Quad(high Dose 65+) 11/16/2019, 11/14/2020, 10/04/2021, 12/12/2022   PFIZER Comirnaty(Gray Top)Covid-19 Tri-Sucrose Vaccine 04/12/2021   PFIZER(Purple Top)SARS-COV-2 Vaccination 05/01/2020, 05/22/2020, 12/05/2020   Pfizer Covid-19 Vaccine Bivalent Booster 17yrs & up 10/04/2021   Pneumococcal Conjugate-13 05/01/2018   Pneumococcal Polysaccharide-23 07/06/2018   Tdap 04/25/2022   Zoster Recombinat (Shingrix) 11/13/2022    TDAP status: Up to date  Flu Vaccine status: Up to date  Pneumococcal vaccine status: Up to date  Covid-19 vaccine status: Information provided on how to obtain vaccines.   Qualifies for Shingles Vaccine? Yes   Zostavax completed No   Shingrix Completed?: No.    Education has been provided regarding the importance of this vaccine. Patient has been advised to call insurance company to determine out of pocket expense if they have not yet received this vaccine. Advised may also receive vaccine at local pharmacy or Health Dept. Verbalized acceptance and understanding.  Screening Tests Health Maintenance  Topic Date Due   Zoster Vaccines- Shingrix (2 of 2) 01/08/2023   COVID-19 Vaccine (7 - 2023-24 season) 02/06/2023   FOOT EXAM  04/26/2023   Pneumonia Vaccine 53+ Years old (3 of 3 - PPSV23 or PCV20) 07/07/2023   OPHTHALMOLOGY EXAM  06/26/2023   INFLUENZA VACCINE  07/10/2023   HEMOGLOBIN  A1C  10/11/2023   Diabetic kidney evaluation - eGFR measurement  12/13/2023   Diabetic kidney evaluation - Urine ACR  12/13/2023   Medicare Annual Wellness (AWV)  05/22/2024   Colonoscopy  06/09/2025   DTaP/Tdap/Td (2 - Td or Tdap) 04/25/2032   Hepatitis C Screening  Completed   HPV VACCINES  Aged Out    Health Maintenance  Health Maintenance Due  Topic Date Due   Zoster Vaccines- Shingrix (2 of 2) 01/08/2023   COVID-19 Vaccine (7 - 2023-24 season) 02/06/2023   FOOT EXAM  04/26/2023   Pneumonia Vaccine 78+ Years old (3 of 3 - PPSV23 or PCV20) 07/07/2023    Colorectal cancer screening: Type of screening: Colonoscopy. Completed 06/09/2018. Repeat every 7 years  Lung Cancer Screening: (Low Dose CT Chest recommended if Age 43-80 years, 30 pack-year currently smoking OR have quit w/in 15years.) does not qualify.   Lung Cancer Screening Referral: not applicable   Additional Screening:  Hepatitis C Screening: does qualify; Completed 07/22/2016  Vision Screening: Recommended annual ophthalmology exams for early detection of glaucoma and other disorders of the eye. Is the patient up to date with their annual eye exam?  Yes  Who  is the provider or what is the name of the office in which the patient attends annual eye exams? [facility]  If pt is not established with a provider, would they like to be referred to a provider to establish care? No .   Dental Screening: Recommended annual dental exams for proper oral hygiene  Community Resource Referral / Chronic Care Management: CRR required this visit?  No   CCM required this visit?  No      Plan:     I have personally reviewed and noted the following in the patient's chart:   Medical and social history Use of alcohol, tobacco or illicit drugs  Current medications and supplements including opioid prescriptions. Patient is not currently taking opioid prescriptions. Functional ability and status Nutritional  status Physical activity Advanced directives List of other physicians Hospitalizations, surgeries, and ER visits in previous 12 months Vitals Screenings to include cognitive, depression, and falls Referrals and appointments  In addition, I have reviewed and discussed with patient certain preventive protocols, quality metrics, and best practice recommendations. A written personalized care plan for preventive services as well as general preventive health recommendations were provided to patient.     Mr. Diblasio , Thank you for taking time to come for your Medicare Wellness Visit. I appreciate your ongoing commitment to your health goals. Please review the following plan we discussed and let me know if I can assist you in the future.   These are the goals we discussed:  Goals   None     This is a list of the screening recommended for you and due dates:  Health Maintenance  Topic Date Due   Zoster (Shingles) Vaccine (2 of 2) 01/08/2023   COVID-19 Vaccine (7 - 2023-24 season) 02/06/2023   Complete foot exam   04/26/2023   Pneumonia Vaccine (3 of 3 - PPSV23 or PCV20) 07/07/2023   Eye exam for diabetics  06/26/2023   Flu Shot  07/10/2023   Hemoglobin A1C  10/11/2023   Yearly kidney function blood test for diabetes  12/13/2023   Yearly kidney health urinalysis for diabetes  12/13/2023   Medicare Annual Wellness Visit  05/22/2024   Colon Cancer Screening  06/09/2025   DTaP/Tdap/Td vaccine (2 - Td or Tdap) 04/25/2032   Hepatitis C Screening  Completed   HPV Vaccine  Aged Out    [place], [place]   05/23/2023  Nurse Notes: Approximately 30 minute Non-Face -To-Face Medicare Wellness Visit      I have reviewed this visit and agree with the documentation.  Marshall L Chambliss

## 2023-05-22 NOTE — Patient Instructions (Signed)
Health Maintenance, Male Adopting a healthy lifestyle and getting preventive care are important in promoting health and wellness. Ask your health care provider about: The right schedule for you to have regular tests and exams. Things you can do on your own to prevent diseases and keep yourself healthy. What should I know about diet, weight, and exercise? Eat a healthy diet  Eat a diet that includes plenty of vegetables, fruits, low-fat dairy products, and lean protein. Do not eat a lot of foods that are high in solid fats, added sugars, or sodium. Maintain a healthy weight Body mass index (BMI) is a measurement that can be used to identify possible weight problems. It estimates body fat based on height and weight. Your health care provider can help determine your BMI and help you achieve or maintain a healthy weight. Get regular exercise Get regular exercise. This is one of the most important things you can do for your health. Most adults should: Exercise for at least 150 minutes each week. The exercise should increase your heart rate and make you sweat (moderate-intensity exercise). Do strengthening exercises at least twice a week. This is in addition to the moderate-intensity exercise. Spend less time sitting. Even light physical activity can be beneficial. Watch cholesterol and blood lipids Have your blood tested for lipids and cholesterol at 69 years of age, then have this test every 5 years. You may need to have your cholesterol levels checked more often if: Your lipid or cholesterol levels are high. You are older than 69 years of age. You are at high risk for heart disease. What should I know about cancer screening? Many types of cancers can be detected early and may often be prevented. Depending on your health history and family history, you may need to have cancer screening at various ages. This may include screening for: Colorectal cancer. Prostate cancer. Skin cancer. Lung  cancer. What should I know about heart disease, diabetes, and high blood pressure? Blood pressure and heart disease High blood pressure causes heart disease and increases the risk of stroke. This is more likely to develop in people who have high blood pressure readings or are overweight. Talk with your health care provider about your target blood pressure readings. Have your blood pressure checked: Every 3-5 years if you are 18-39 years of age. Every year if you are 40 years old or older. If you are between the ages of 65 and 75 and are a current or former smoker, ask your health care provider if you should have a one-time screening for abdominal aortic aneurysm (AAA). Diabetes Have regular diabetes screenings. This checks your fasting blood sugar level. Have the screening done: Once every three years after age 45 if you are at a normal weight and have a low risk for diabetes. More often and at a younger age if you are overweight or have a high risk for diabetes. What should I know about preventing infection? Hepatitis B If you have a higher risk for hepatitis B, you should be screened for this virus. Talk with your health care provider to find out if you are at risk for hepatitis B infection. Hepatitis C Blood testing is recommended for: Everyone born from 1945 through 1965. Anyone with known risk factors for hepatitis C. Sexually transmitted infections (STIs) You should be screened each year for STIs, including gonorrhea and chlamydia, if: You are sexually active and are younger than 69 years of age. You are older than 69 years of age and your   health care provider tells you that you are at risk for this type of infection. Your sexual activity has changed since you were last screened, and you are at increased risk for chlamydia or gonorrhea. Ask your health care provider if you are at risk. Ask your health care provider about whether you are at high risk for HIV. Your health care provider  may recommend a prescription medicine to help prevent HIV infection. If you choose to take medicine to prevent HIV, you should first get tested for HIV. You should then be tested every 3 months for as long as you are taking the medicine. Follow these instructions at home: Alcohol use Do not drink alcohol if your health care provider tells you not to drink. If you drink alcohol: Limit how much you have to 0-2 drinks a day. Know how much alcohol is in your drink. In the [place] one drink equals one 12 oz bottle of beer (355 mL), one 5 oz glass of wine (148 mL), or one 1 oz glass of hard liquor (44 mL). Lifestyle Do not use any products that contain nicotine or tobacco. These products include cigarettes, chewing tobacco, and vaping devices, such as e-cigarettes. If you need help quitting, ask your health care provider. Do not use street drugs. Do not share needles. Ask your health care provider for help if you need support or information about quitting drugs. General instructions Schedule regular health, dental, and eye exams. Stay current with your vaccines. Tell your health care provider if: You often feel depressed. You have ever been abused or do not feel safe at home. Summary Adopting a healthy lifestyle and getting preventive care are important in promoting health and wellness. Follow your health care provider's instructions about healthy diet, exercising, and getting tested or screened for diseases. Follow your health care provider's instructions on monitoring your cholesterol and blood pressure. This information is not intended to replace advice given to you by your health care provider. Make sure you discuss any questions you have with your health care provider. Document Revised: 04/16/2021 Document Reviewed: 04/16/2021 Elsevier Patient Education  2024 Elsevier Inc.  

## 2023-05-23 ENCOUNTER — Ambulatory Visit (INDEPENDENT_AMBULATORY_CARE_PROVIDER_SITE_OTHER): Payer: Medicare HMO

## 2023-05-23 VITALS — BP 127/73

## 2023-05-23 DIAGNOSIS — Z0000 Encounter for general adult medical examination without abnormal findings: Secondary | ICD-10-CM | POA: Diagnosis not present

## 2023-05-26 ENCOUNTER — Other Ambulatory Visit: Payer: Self-pay

## 2023-05-26 ENCOUNTER — Encounter: Payer: Self-pay | Admitting: Family Medicine

## 2023-05-26 ENCOUNTER — Ambulatory Visit (INDEPENDENT_AMBULATORY_CARE_PROVIDER_SITE_OTHER): Payer: Medicare HMO | Admitting: Family Medicine

## 2023-05-26 VITALS — BP 134/83 | HR 66 | Ht 71.0 in | Wt 153.8 lb

## 2023-05-26 DIAGNOSIS — Z794 Long term (current) use of insulin: Secondary | ICD-10-CM

## 2023-05-26 DIAGNOSIS — E119 Type 2 diabetes mellitus without complications: Secondary | ICD-10-CM

## 2023-05-26 DIAGNOSIS — E785 Hyperlipidemia, unspecified: Secondary | ICD-10-CM | POA: Diagnosis not present

## 2023-05-26 DIAGNOSIS — I10 Essential (primary) hypertension: Secondary | ICD-10-CM

## 2023-05-26 IMAGING — DX DG ELBOW COMPLETE 3+V*L*
4 series · 4 of 4 positions shown · non-contrast
Comparison: None.

CLINICAL DATA: Fall with posterior elbow pain

EXAM:
LEFT ELBOW - COMPLETE 3+ VIEW

[elbow ap]
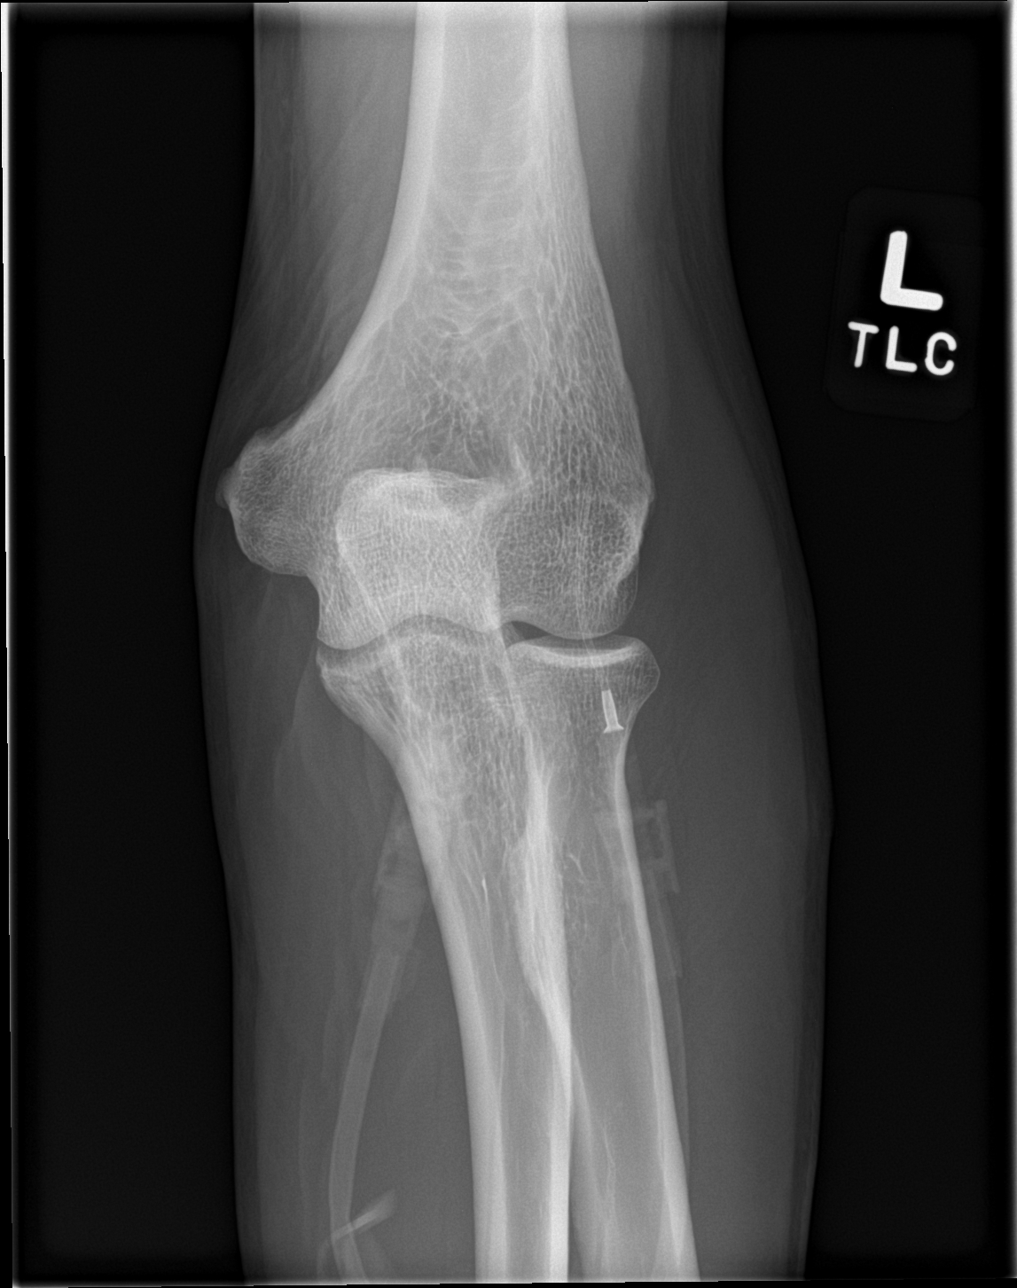

[elbow obl (1 of 2)]
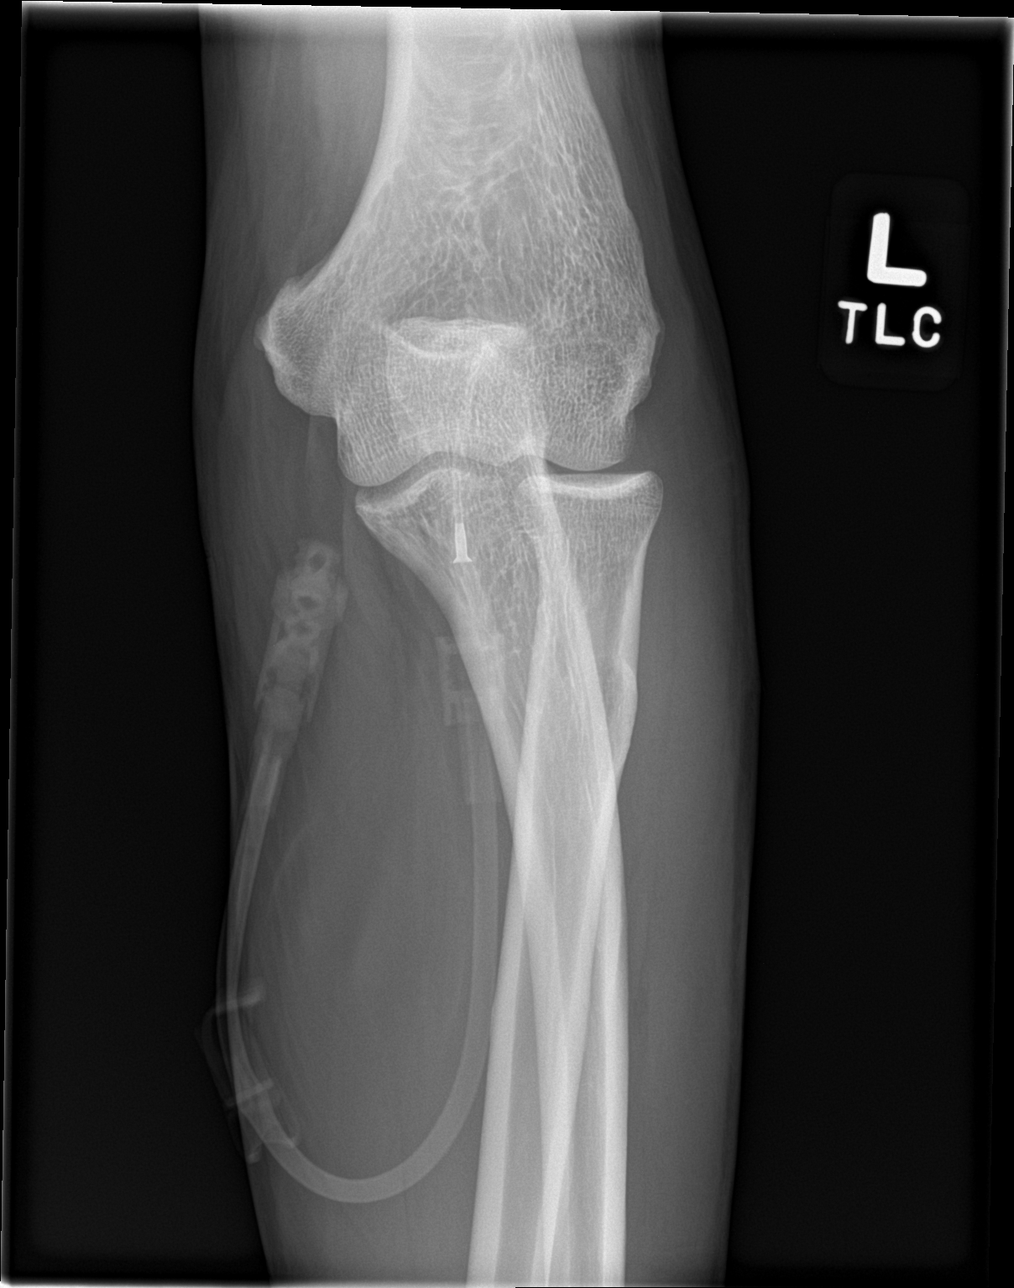

[elbow obl (2 of 2)]
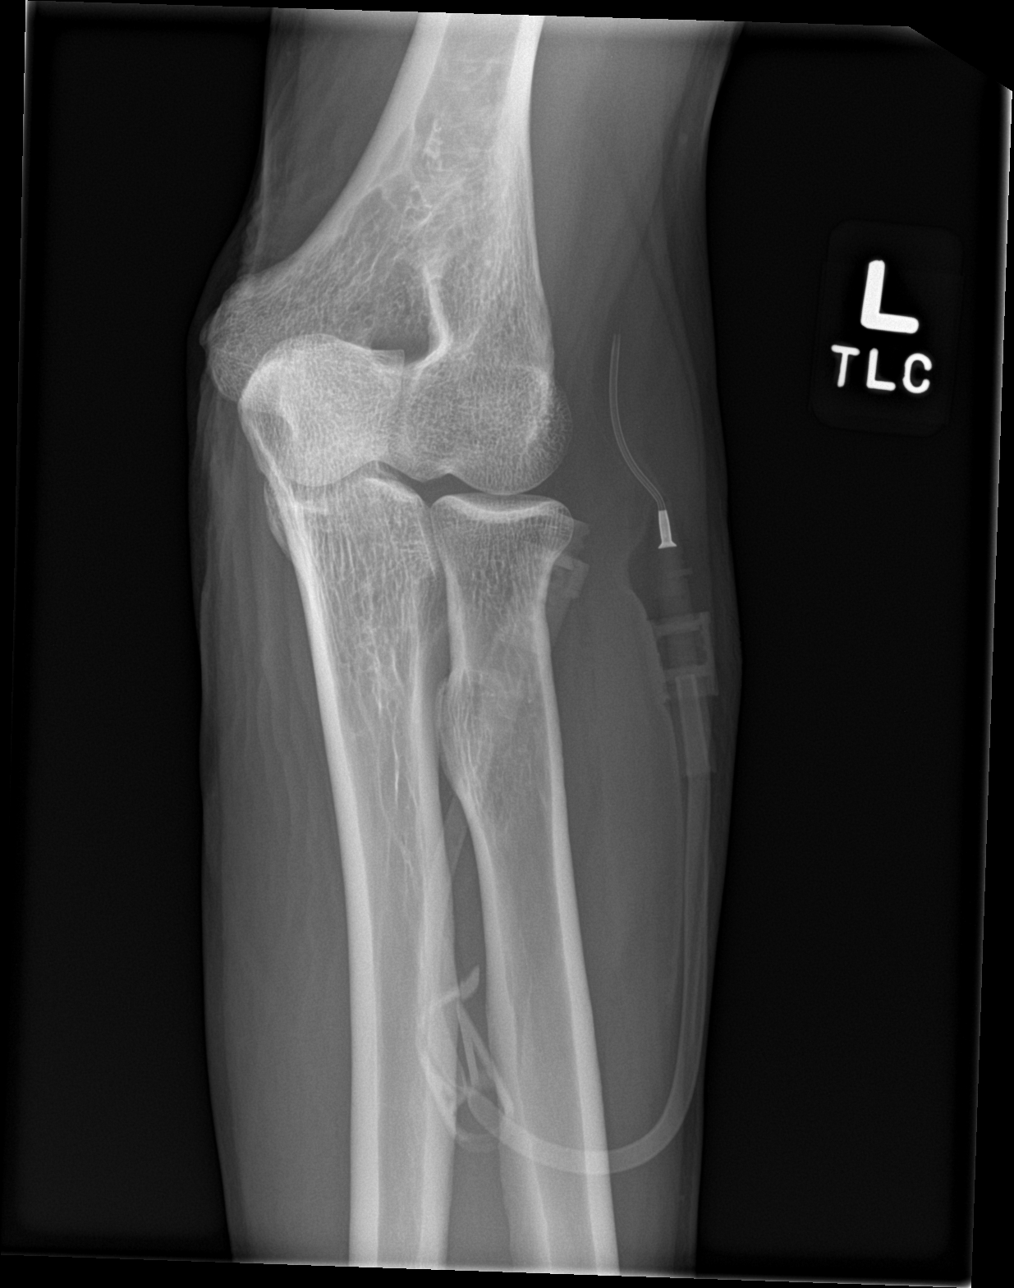

[elbow lat]
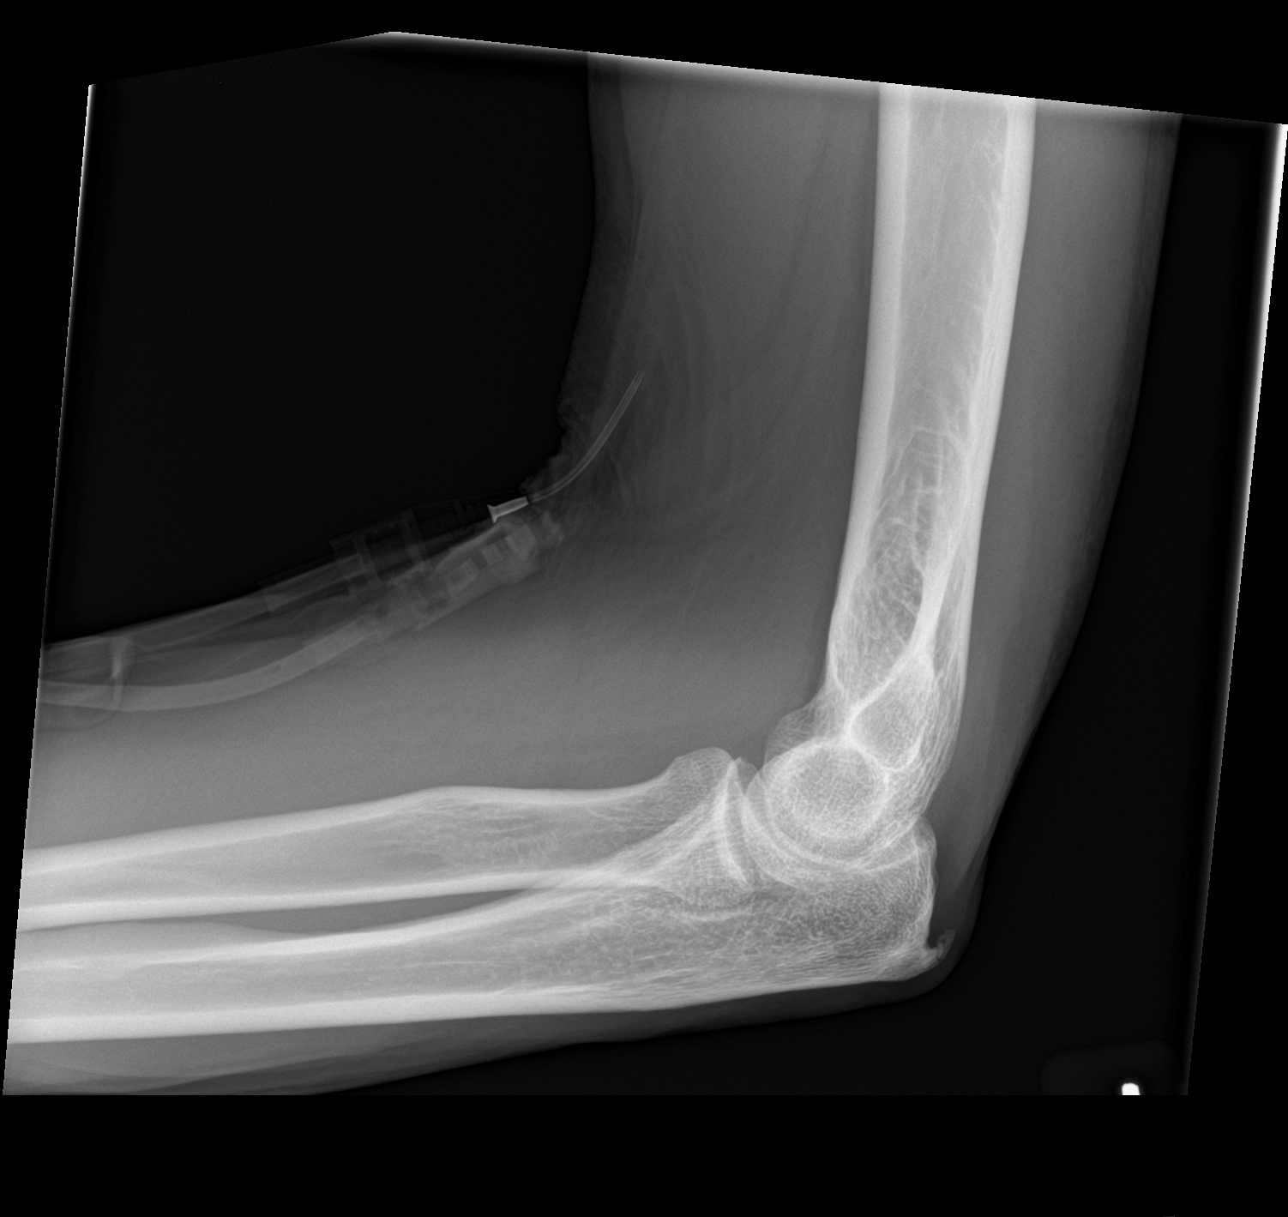

[4 of 4 positions shown; findings below may reference images not displayed]

FINDINGS: There is no evidence of fracture, dislocation, or joint effusion.
Smaller olecranon spur
IMPRESSION: Negative.

## 2023-05-26 IMAGING — CR DG CHEST 1V
1 series · 1 of 1 positions shown · non-contrast
Comparison: Chest radiograph 11/20/2006

CLINICAL DATA: Syncopal episode

EXAM:
CHEST  1 VIEW

[chest ap]
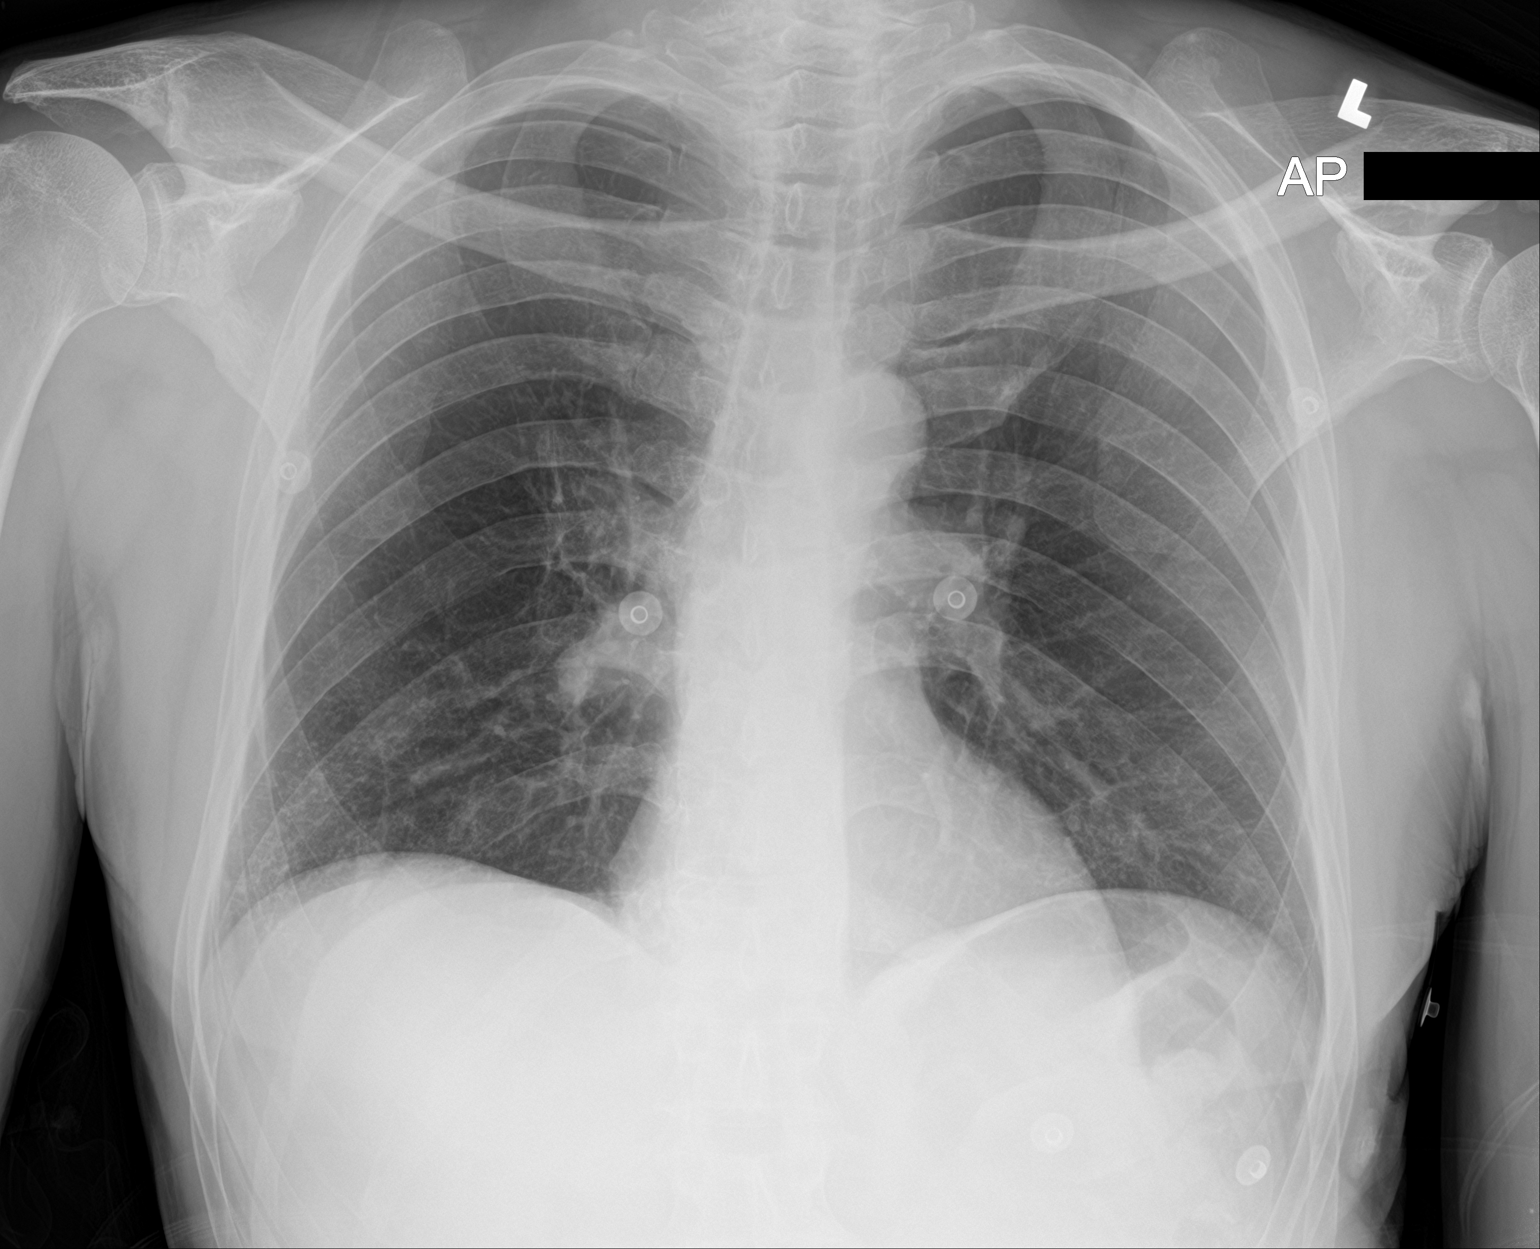

[1 of 1 positions shown; findings below may reference images not displayed]

FINDINGS: The cardiomediastinal silhouette is normal.

There are small nodular opacities in the lateral right lower lobe.
There is no focal consolidation or pulmonary edema. There is no
pleural effusion or pneumothorax.

There is no acute osseous abnormality.
IMPRESSION: Small nodular opacities in the lateral right lower lobe could
reflect infection in the correct clinical setting.

## 2023-05-26 IMAGING — CT CT HEAD W/O CM
4 series · 16 of 47 positions shown, 18 images · non-contrast
Comparison: None.

CLINICAL DATA: Seizure, nontraumatic (Age >= 41y) Head trauma,
minor (Age >= 65y)

EXAM:
CT HEAD WITHOUT CONTRAST
TECHNIQUE: Contiguous axial images were obtained from the base of the skull
through the vertex without intravenous contrast.

[Series 503: head without · axial · non-contrast · 0.44mm/px · z∈[-82,+43]mm · 7 of 35 slices shown, 9 images]
[im 5/35  brain]
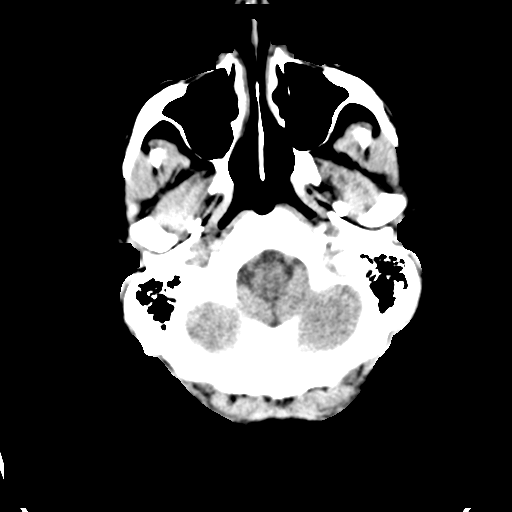
[im 5/35  bone]
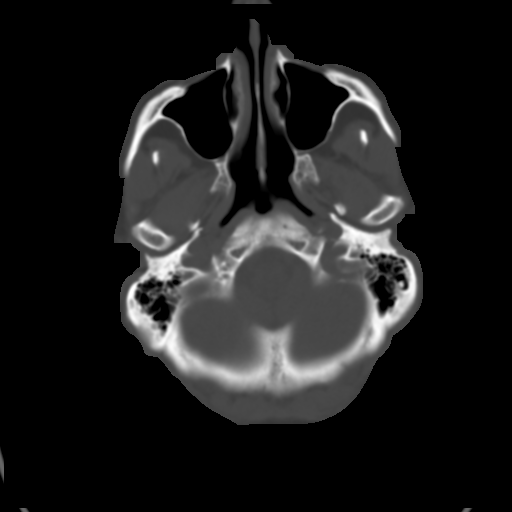
[im 9/35  brain]
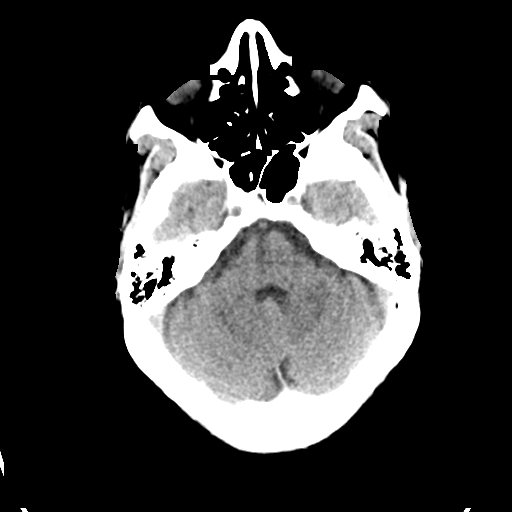
[im 13/35  brain]
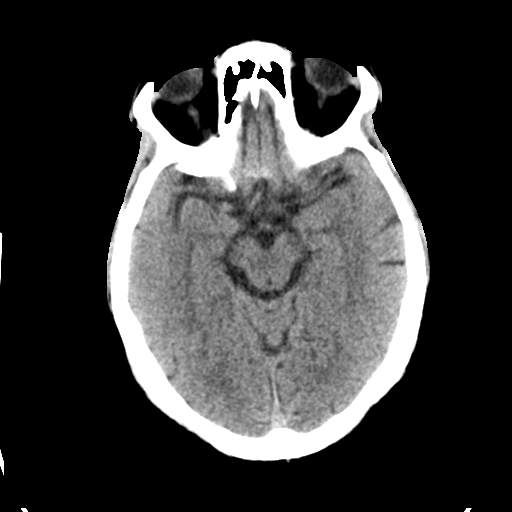
[im 18/35  brain]
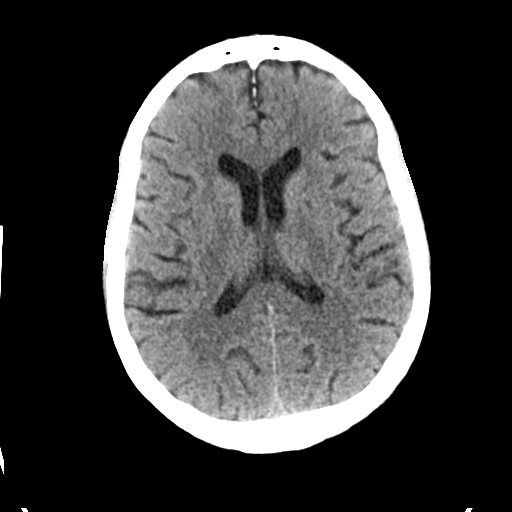
[im 22/35  brain]
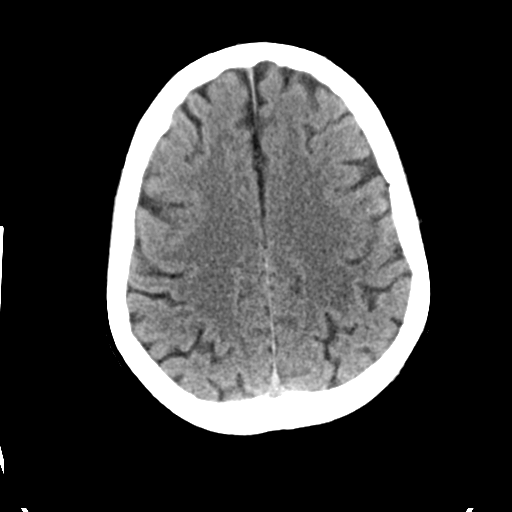
[im 22/35  bone]
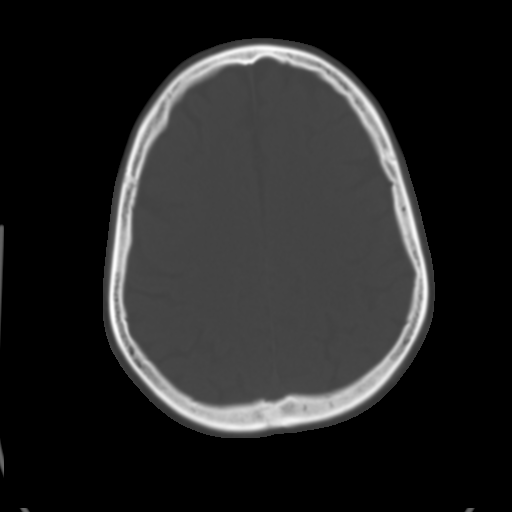
[im 26/35  brain]
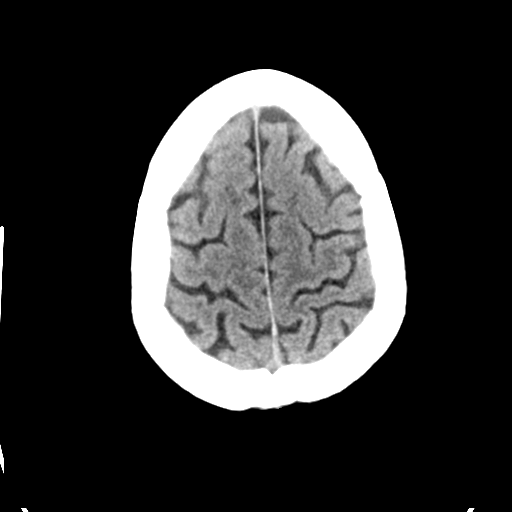
[im 30/35  brain]
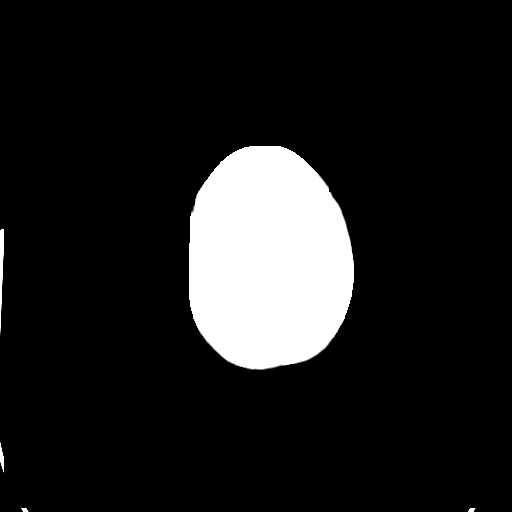

[Series 504: head bone · axial · 0.44mm/px · z∈[-86,-52]mm · 3 of 86 slices shown]
[im 9/86  bone]
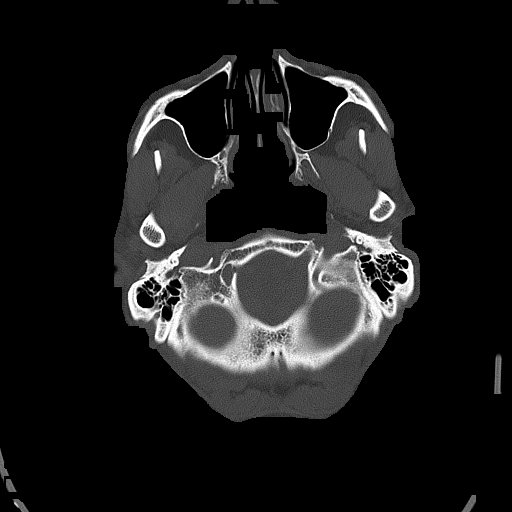
[im 18/86  bone]
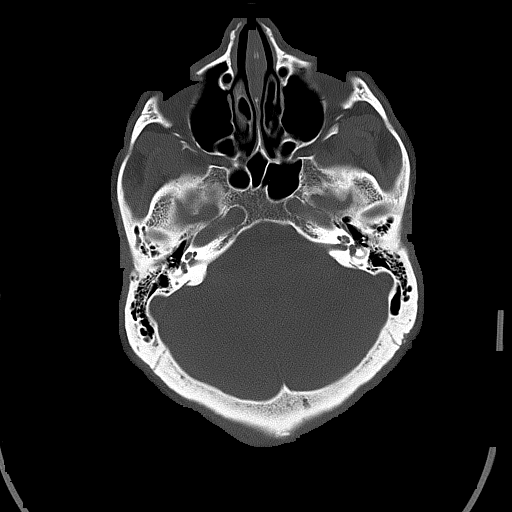
[im 26/86  bone]
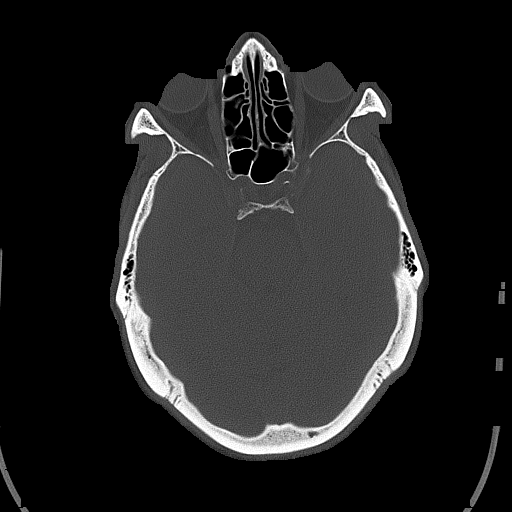

[Series 505: head without cor · coronal · non-contrast · 0.31mm/px · 3 of 67 slices shown]
[im 23/67  brain]
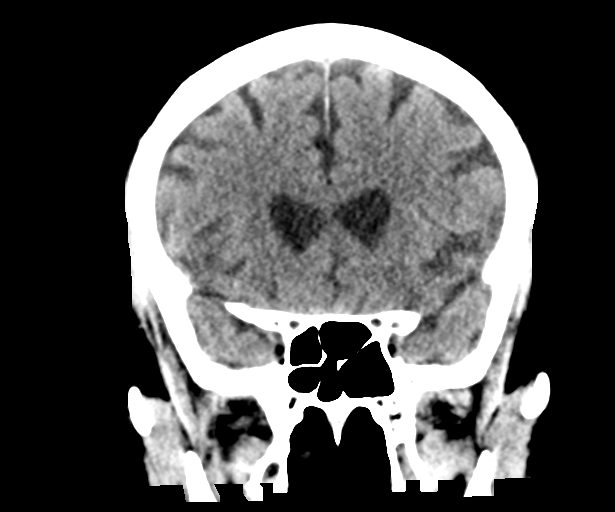
[im 30/67  brain]
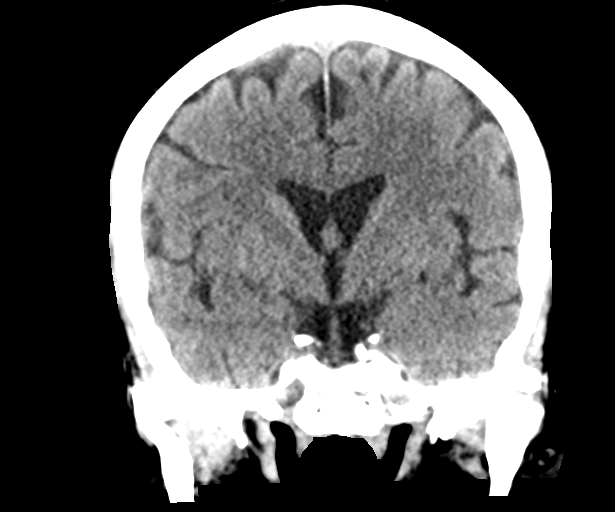
[im 37/67  brain]
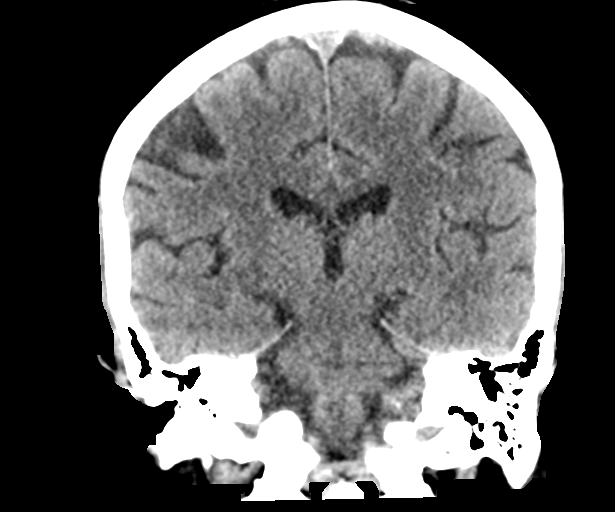

[Series 506: head without sag · sagittal · non-contrast · 0.33mm/px · 3 of 59 slices shown]
[im 20/59  brain]
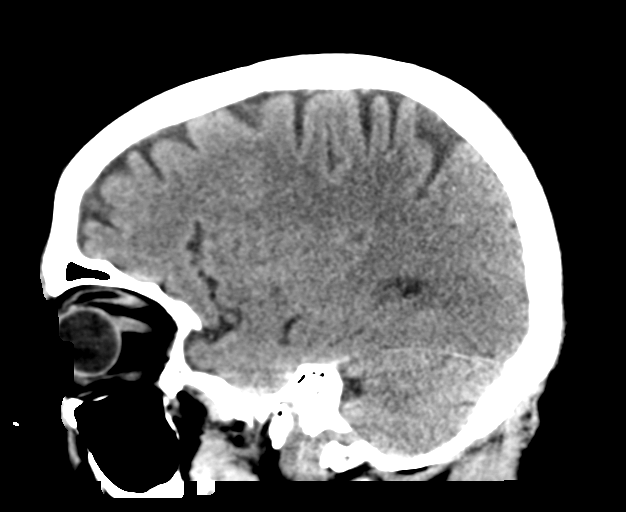
[im 30/59  brain]
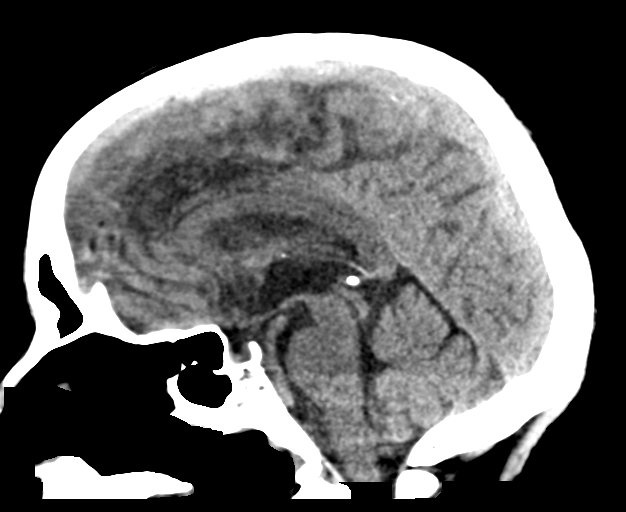
[im 39/59  brain]
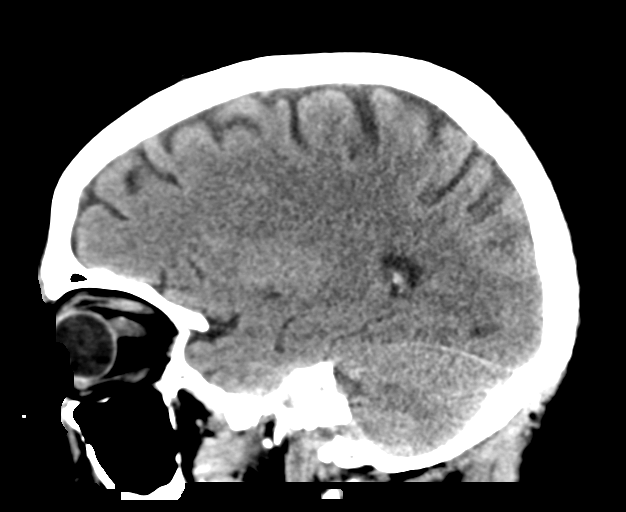

[16 of 47 positions shown; findings below may reference images not displayed]

FINDINGS: Brain: There is no acute intracranial hemorrhage, mass effect, or
edema. Gray-white differentiation is preserved. There is no
extra-axial fluid collection. Ventricles and sulci are within normal
limits in size and configuration.

Vascular: There is atherosclerotic calcification at the skull base.

Skull: Calvarium is unremarkable.

Sinuses/Orbits: No acute finding.

Other: None.
IMPRESSION: No acute intracranial abnormality.

## 2023-05-26 IMAGING — CT CT CERVICAL SPINE W/O CM
3 of 4 series · 11 of 33 positions shown, 13 images · non-contrast
Comparison: None.

CLINICAL DATA: Neck trauma (Age >= 65y)

EXAM:
CT CERVICAL SPINE WITHOUT CONTRAST
TECHNIQUE: Multidetector CT imaging of the cervical spine was performed without
intravenous contrast. Multiplanar CT image reconstructions were also
generated.

[Series 5: c_spine 2.0 st · axial · 0.33mm/px · z∈[-220,-76]mm · 3 of 109 slices shown, 4 images]
[im 19/109  soft-tissue]
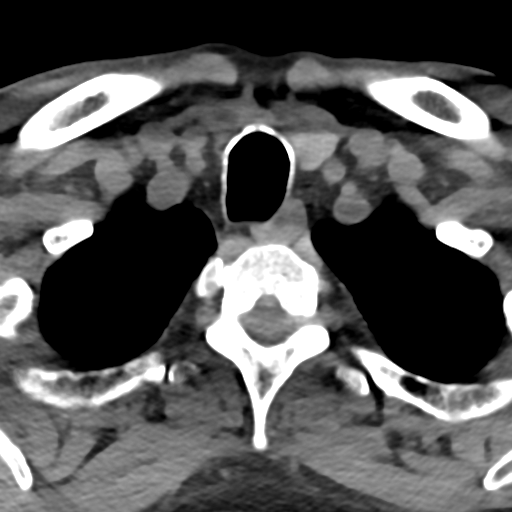
[im 19/109  bone]
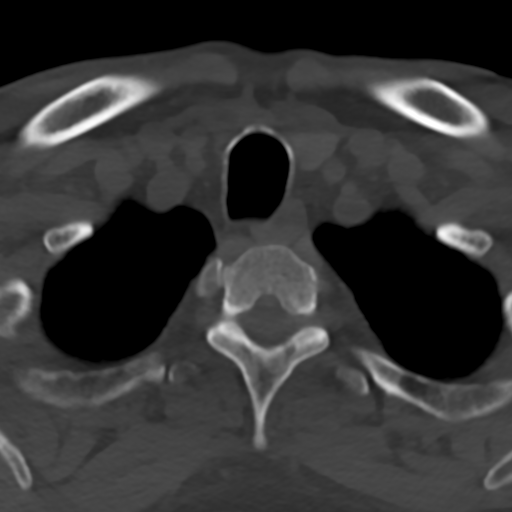
[im 55/109  bone]
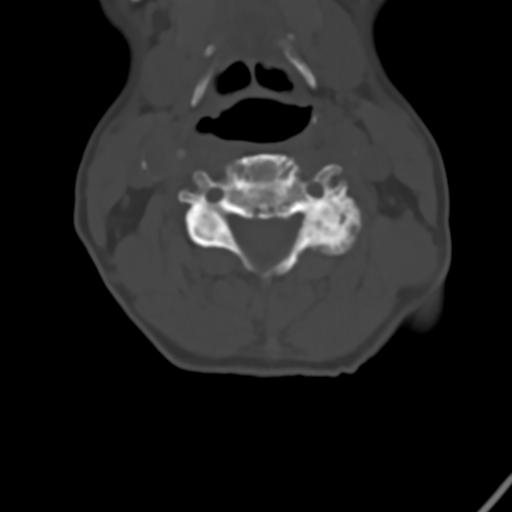
[im 91/109  bone]
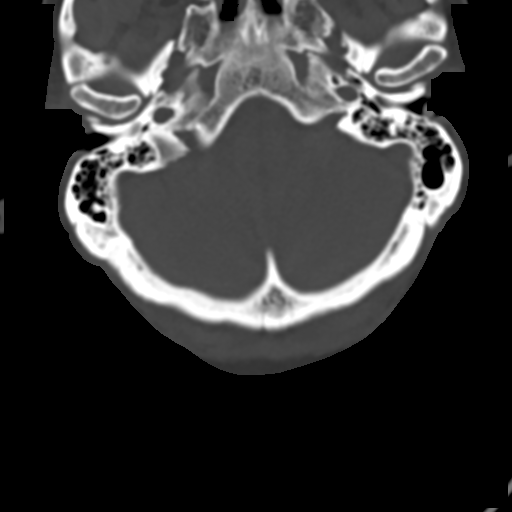

[Series 12: c_spine 2.0 sag bone · sagittal · 0.25mm/px · 5 of 61 slices shown, 6 images]
[im 21/61  bone]
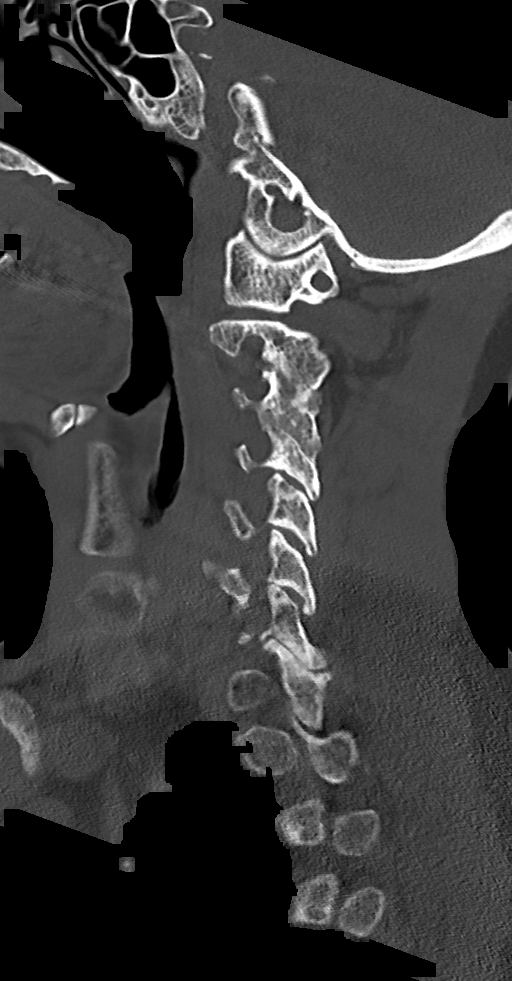
[im 26/61  bone]
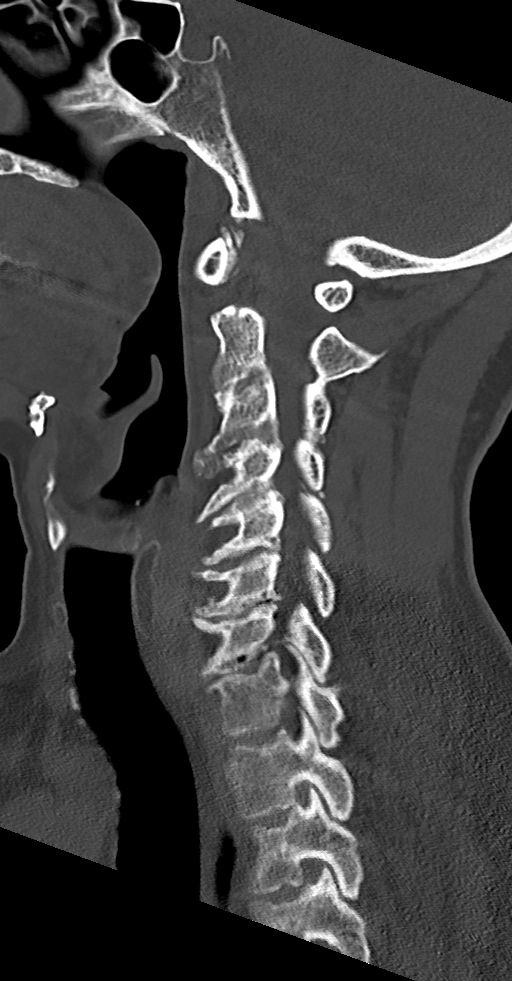
[im 31/61  soft-tissue]
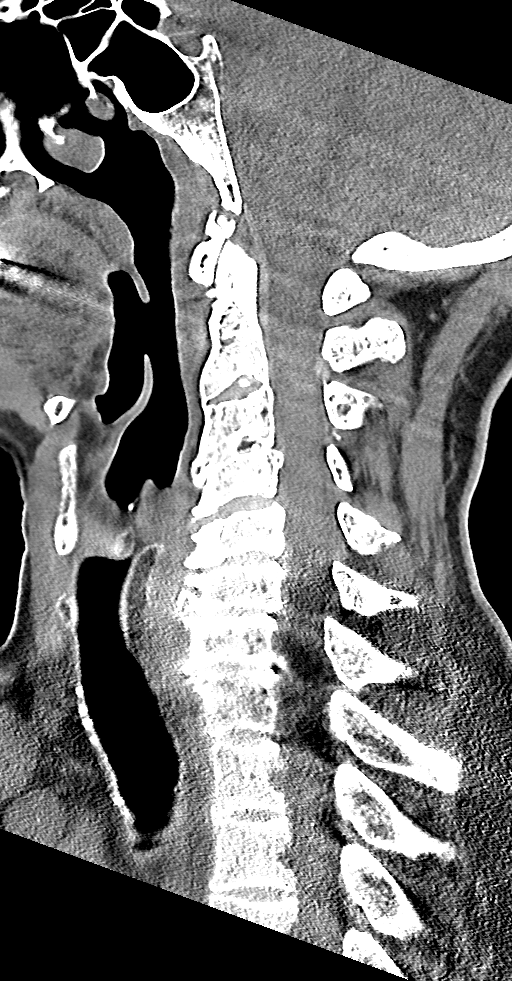
[im 31/61  bone]
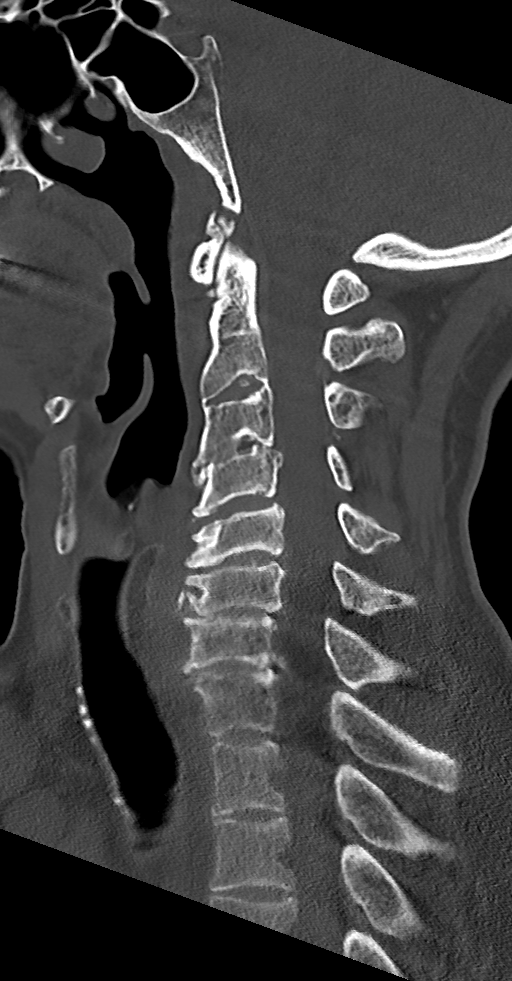
[im 36/61  bone]
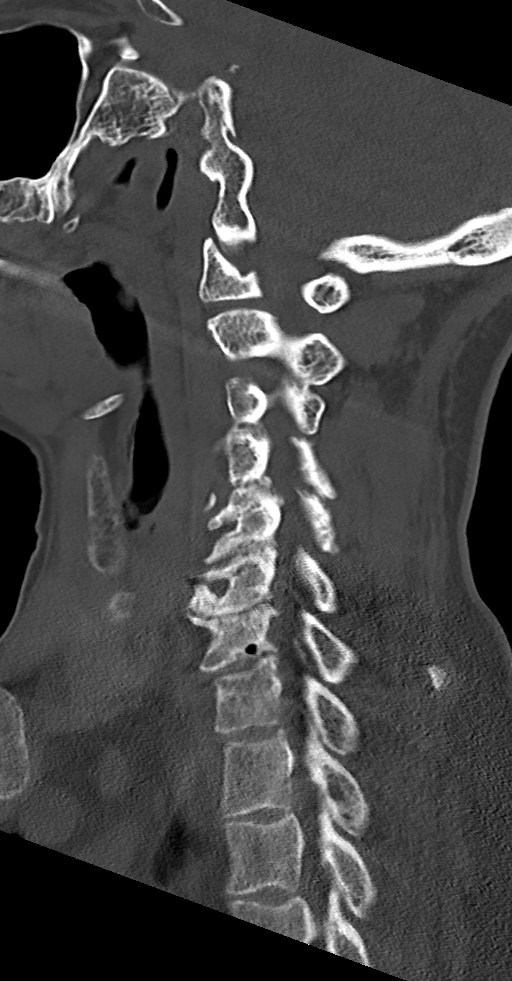
[im 41/61  bone]
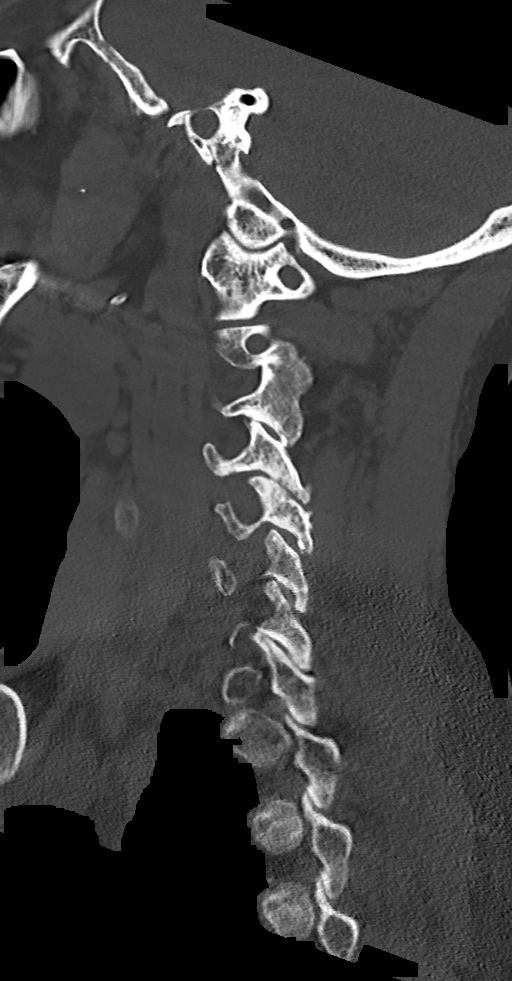

[Series 13: c_spine 2.0 cor bone · coronal · 0.32mm/px · 3 of 61 slices shown]
[im 13/61  bone]
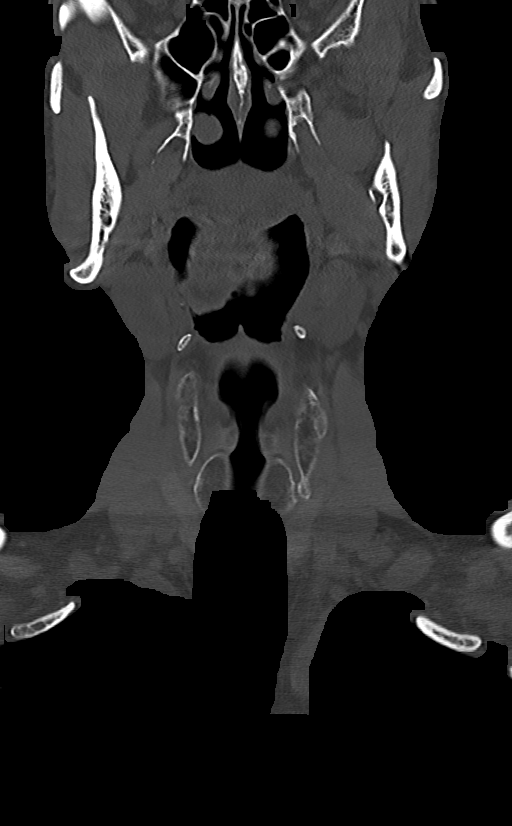
[im 25/61  bone]
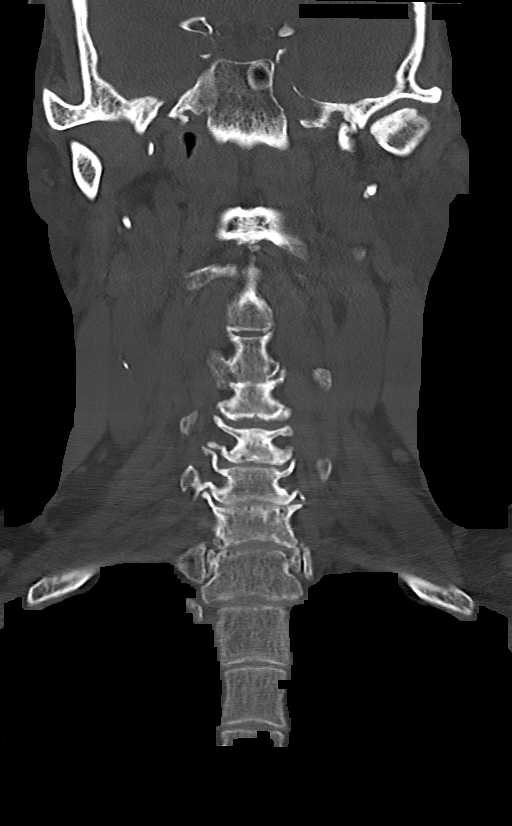
[im 37/61  bone]
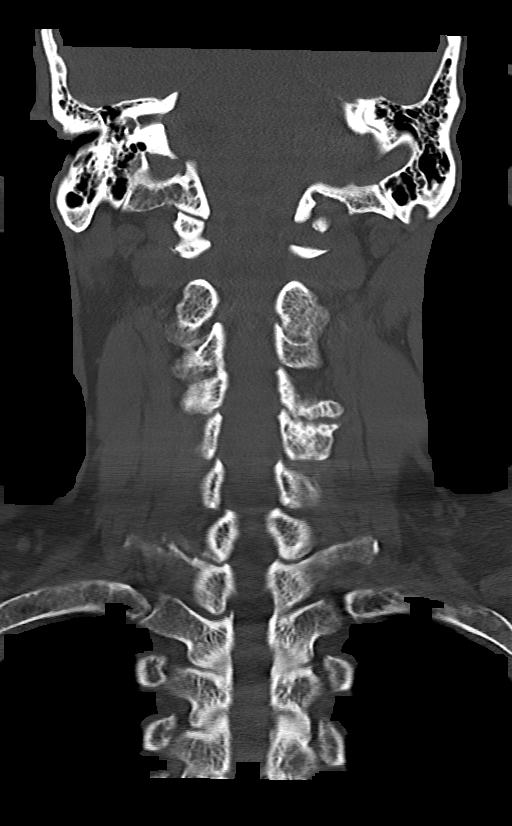

[11 of 33 positions shown; findings below may reference images not displayed]

FINDINGS: Alignment: No significant listhesis.

Skull base and vertebrae: Multilevel degenerative endplate
irregularity. No acute fracture.

Soft tissues and spinal canal: No prevertebral fluid or swelling. No
visible canal hematoma.

Disc levels: Multilevel degenerative changes are present including
disc space narrowing, endplate osteophytes, and facet and
uncovertebral hypertrophy. Fusion of C2-C3 facets.

Upper chest: No apical lung mass.

Other: Mild calcified plaque at the common carotid bifurcations.
IMPRESSION: No acute cervical spine fracture.

## 2023-05-26 MED ORDER — ATORVASTATIN CALCIUM 80 MG PO TABS
80.0000 mg | ORAL_TABLET | Freq: Every day | ORAL | 3 refills | Status: DC
Start: 2023-05-26 — End: 2024-07-30

## 2023-05-26 MED ORDER — BASAGLAR KWIKPEN 100 UNIT/ML ~~LOC~~ SOPN: 15.0000 [IU] | PEN_INJECTOR | Freq: Every day | SUBCUTANEOUS | Status: DC

## 2023-05-26 NOTE — Patient Instructions (Signed)
It was great to see you again today.  Decrease insulin to 15 units daily. Let me know if still having sugars under 70.  Increase lipitor to 80mg  daily.  Follow up in early August Call with any questions  Be well, Dr. Pollie Meyer

## 2023-05-26 NOTE — Assessment & Plan Note (Signed)
Still with some low sugars. Decrease glargine to 15u daily. Continue jardiance. Follow up in early August for next A1c.

## 2023-05-26 NOTE — Assessment & Plan Note (Signed)
Well controlled. Continue current medication regimen.  

## 2023-05-26 NOTE — Progress Notes (Signed)
  Date of Visit: 05/26/2023   SUBJECTIVE:   HPI:  Richard Gill presents today for follow-up.  Diabetes - currently taking glargine 16u every morning, jardiance 25mg  daily.  He brought log of sugars.  Is having some occasional sugars in the 60s.  Otherwise tend to be under 150.  He typically is able to feel when his sugar is not low.  He is dealing with some sweet cravings.  Hypertension: Currently taking amlodipine 5 mg daily and lisinopril 5 mg daily.  Tolerating these medications well.  Hyperlipidemia: Currently taking atorvastatin 40 mg daily.  Last LDL was 107.   OBJECTIVE:   BP 134/83   Pulse 66   Ht 5\' 11"  (1.803 m)   Wt 153 lb 12.8 oz (69.8 kg)   SpO2 100%   BMI 21.45 kg/m  Gen: No acute distress, pleasant, cooperative HEENT: Normocephalic, atraumatic Heart: Regular rate and rhythm, no murmur Lungs: Clear to auscultation bilaterally, normal effort Neuro: Grossly nonfocal, speech normal  ASSESSMENT/PLAN:   Health maintenance:  -reports he did receive 2nd dose of shingrix, unable to access [facility] to confirm  HTN (hypertension) Well controlled. Continue current medication regimen.   Hyperlipidemia LDL not at goal - increase atorv to 80mg  daily.  T2DM (type 2 diabetes mellitus) (HCC) Still with some low sugars. Decrease glargine to 15u daily. Continue jardiance. Follow up in early August for next A1c.  FOLLOW UP: Follow up in ~7wks for next A1c  [place] J. Pollie Meyer, MD [facility]

## 2023-05-26 NOTE — Assessment & Plan Note (Signed)
LDL not at goal - increase atorv to 80mg  daily.

## 2023-06-18 DIAGNOSIS — H2513 Age-related nuclear cataract, bilateral: Secondary | ICD-10-CM | POA: Diagnosis not present

## 2023-06-18 DIAGNOSIS — E119 Type 2 diabetes mellitus without complications: Secondary | ICD-10-CM | POA: Diagnosis not present

## 2023-06-18 DIAGNOSIS — H52203 Unspecified astigmatism, bilateral: Secondary | ICD-10-CM | POA: Diagnosis not present

## 2023-06-18 LAB — HM DIABETES EYE EXAM

## 2023-06-23 ENCOUNTER — Encounter: Payer: Self-pay | Admitting: Family Medicine

## 2023-06-30 ENCOUNTER — Other Ambulatory Visit: Payer: Self-pay | Admitting: Family Medicine

## 2023-07-11 ENCOUNTER — Telehealth: Payer: Self-pay

## 2023-07-11 DIAGNOSIS — E1122 Type 2 diabetes mellitus with diabetic chronic kidney disease: Secondary | ICD-10-CM | POA: Diagnosis not present

## 2023-07-11 DIAGNOSIS — I129 Hypertensive chronic kidney disease with stage 1 through stage 4 chronic kidney disease, or unspecified chronic kidney disease: Secondary | ICD-10-CM | POA: Diagnosis not present

## 2023-07-11 DIAGNOSIS — N2581 Secondary hyperparathyroidism of renal origin: Secondary | ICD-10-CM | POA: Diagnosis not present

## 2023-07-11 DIAGNOSIS — N184 Chronic kidney disease, stage 4 (severe): Secondary | ICD-10-CM | POA: Diagnosis not present

## 2023-07-11 NOTE — Telephone Encounter (Signed)
Patient calls nurse line regarding issues with atorvastatin prescription. He states that pharmacy told him that he cannot pick medication up until 8/8, however, he is out now.   Called the pharmacy. They were running prescription for 40 mg tablets. Advised that Dr. Pollie Meyer prescribed 80 mg tablets on 05/26/23.   They were able to run this prescription through his insurance and are working on getting rx ready now.   Called patient and provided with update.   Veronda Prude, RN

## 2023-07-14 ENCOUNTER — Encounter: Payer: Self-pay | Admitting: Urology

## 2023-07-15 ENCOUNTER — Ambulatory Visit
Admission: RE | Admit: 2023-07-15 | Discharge: 2023-07-15 | Disposition: A | Discharge status: Home / Self Care | Payer: Medicare HMO | Source: Ambulatory Visit | Attending: Urology | Admitting: Urology

## 2023-07-15 DIAGNOSIS — N281 Cyst of kidney, acquired: Secondary | ICD-10-CM | POA: Diagnosis not present

## 2023-07-15 MED ORDER — GADOPICLENOL 0.5 MMOL/ML IV SOLN
7.0000 mL | Freq: Once | INTRAVENOUS | Status: AC | PRN
  Administered 2023-07-15: 7 mL via INTRAVENOUS

## 2023-07-21 DIAGNOSIS — N401 Enlarged prostate with lower urinary tract symptoms: Secondary | ICD-10-CM | POA: Diagnosis not present

## 2023-07-21 DIAGNOSIS — N281 Cyst of kidney, acquired: Secondary | ICD-10-CM | POA: Diagnosis not present

## 2023-07-21 DIAGNOSIS — R350 Frequency of micturition: Secondary | ICD-10-CM | POA: Diagnosis not present

## 2023-07-21 DIAGNOSIS — R972 Elevated prostate specific antigen [PSA]: Secondary | ICD-10-CM | POA: Diagnosis not present

## 2023-09-16 ENCOUNTER — Encounter: Payer: Self-pay | Admitting: Family Medicine

## 2023-09-16 ENCOUNTER — Ambulatory Visit (INDEPENDENT_AMBULATORY_CARE_PROVIDER_SITE_OTHER): Payer: Medicare HMO | Admitting: Family Medicine

## 2023-09-16 VITALS — BP 129/70 | HR 63 | Ht 71.0 in | Wt 151.0 lb

## 2023-09-16 DIAGNOSIS — Z794 Long term (current) use of insulin: Secondary | ICD-10-CM

## 2023-09-16 DIAGNOSIS — Z0000 Encounter for general adult medical examination without abnormal findings: Secondary | ICD-10-CM

## 2023-09-16 DIAGNOSIS — E119 Type 2 diabetes mellitus without complications: Secondary | ICD-10-CM | POA: Diagnosis not present

## 2023-09-16 DIAGNOSIS — Z23 Encounter for immunization: Secondary | ICD-10-CM

## 2023-09-16 LAB — POCT GLYCOSYLATED HEMOGLOBIN (HGB A1C): HbA1c, POC (controlled diabetic range): 9.4 % — AB (ref 0.0–7.0)

## 2023-09-16 MED ORDER — DEXCOM G7 SENSOR MISC: 3 refills | Status: DC

## 2023-09-16 NOTE — Progress Notes (Unsigned)
  Date of Visit: 09/16/2023   SUBJECTIVE:   HPI:  Richard Gill presents today for routine follow-up.  Diabetes: Currently taking insulin glargine 25 units daily.  Also takes Jardiance 25 mg daily.  Fasting sugars are somewhat variable, lowest around 85, but up to 222 this morning.  He suspects he has had low sugars other times as well and his treated them by eating, with symptomatic at those times.  Does not have a CGM but is willing to try one.  Does have a smart phone that can download apps.   OBJECTIVE:   BP 129/70   Pulse 63   Ht 5\' 11"  (1.803 m)   Wt 151 lb (68.5 kg)   SpO2 99%   BMI 21.06 kg/m  Gen: No acute distress, pleasant, cooperative, well-appearing HEENT: Normocephalic, atraumatic Lungs: Normal work of breathing on room air Neuro: Grossly nonfocal, speech normal Ext: No edema  ASSESSMENT/PLAN:   Assessment & Plan Type 2 diabetes mellitus without complication, with long-term current use of insulin (HCC) Uncontrolled with A1c of 9.4.  Difficult to make any adjustments to his insulin due to concerns for intermittent episodes of hypoglycemia.  I think he would be best served by getting a continuous glucose monitor to more closely follow his sugars.  Order Dexcom G7, sent to his pharmacy.  Advised to schedule an appointment with Dr. Raymondo Band for teaching on use of CGM, and to bring supplies with him to that appointment. Routine adult health maintenance Received flu, COVID, and PCV 20 vaccines today    [place] J. Pollie Meyer, MD [facility]

## 2023-09-16 NOTE — Patient Instructions (Addendum)
It was great to see you again today.  Sent in dexcom G7 sensors to monitor your blood sugar  Please get them from your pharmacy and then schedule with Dr. Raymondo Band to get set up with these  Flu, COVID, pneumonia shots today  A1c is 9.4 - the dexcom should help [place] get this under better control  Be well, Dr. Pollie Meyer

## 2023-09-18 NOTE — Assessment & Plan Note (Signed)
Received flu, COVID, and PCV 20 vaccines today

## 2023-09-18 NOTE — Assessment & Plan Note (Signed)
Uncontrolled with A1c of 9.4.  Difficult to make any adjustments to his insulin due to concerns for intermittent episodes of hypoglycemia.  I think he would be best served by getting a continuous glucose monitor to more closely follow his sugars.  Order Dexcom G7, sent to his pharmacy.  Advised to schedule an appointment with Dr. Raymondo Band for teaching on use of CGM, and to bring supplies with him to that appointment.

## 2023-09-24 ENCOUNTER — Ambulatory Visit: Payer: Medicare HMO | Admitting: Pharmacist

## 2023-09-25 ENCOUNTER — Ambulatory Visit (INDEPENDENT_AMBULATORY_CARE_PROVIDER_SITE_OTHER): Payer: Medicare HMO | Admitting: Pharmacist

## 2023-09-25 ENCOUNTER — Encounter: Payer: Self-pay | Admitting: Pharmacist

## 2023-09-25 VITALS — BP 129/69 | HR 51 | Ht 70.0 in | Wt 153.8 lb

## 2023-09-25 DIAGNOSIS — E119 Type 2 diabetes mellitus without complications: Secondary | ICD-10-CM | POA: Diagnosis not present

## 2023-09-25 DIAGNOSIS — Z794 Long term (current) use of insulin: Secondary | ICD-10-CM

## 2023-09-25 NOTE — Progress Notes (Signed)
Reviewed and agree with Dr Koval's plan.   

## 2023-09-25 NOTE — Assessment & Plan Note (Signed)
Diabetes longstanding currently uncontrolled. Patient is able to verbalize appropriate hypoglycemia management plan. Medication adherence appears okay. Control is suboptimal due to Pt feeling intermittent symptoms of hypoglycemia with FBG ranges from 85-222 mg/dL. - Started Dexcom G7 CGM and paired with receiver - counseled Pt on how to apply sensor and pair with receiver, and interpretation of receiver readings -Patient educated on purpose and proper use of using CGM.  -Extensively discussed pathophysiology of diabetes, recommended lifestyle interventions, dietary effects on blood sugar control.  -Counseled on s/sx of and management of hypoglycemia.

## 2023-09-25 NOTE — Patient Instructions (Signed)
It was nice to see you today!  Your goal blood sugar is 80-130 before eating and less than 180 after eating.  Medication Changes: START using Dexcom G7 continuous glucose monitor   Continue all other medication the same.   Keep up the good work with diet and exercise. Aim for a diet full of vegetables, fruit and lean meats (chicken, [place], fish). Try to limit salt intake by eating fresh or frozen vegetables (instead of canned), rinse canned vegetables prior to cooking and do not add any additional salt to meals.

## 2023-09-25 NOTE — Progress Notes (Signed)
S:     Chief Complaint  Patient presents with   Medication Management    Dexcom G7 Start   67 y.o. male who presents for diabetes evaluation, education, and management. Patient arrives in  good spirits and presents without any assistance.  Patient was referred and last seen by Primary Care Provider, Dr. Pollie Meyer, on 09/16/2023.   PMH is significant for T2DM, HTN, HLD At last visit, Pt reported suspecting low BG requiring eating/drinking, and was willing to discuss CGM for glucose monitoring.   Patient reports Diabetes was diagnosed years ago.   Current diabetes medications include: insulin glargine (Basaglar) once daily Current hypertension medications include: amlodipine 5 mg once daily, empagliflozin 25 mg once daily, lisinopril 5 mg once daily Current hyperlipidemia medications include: atorvastatin 80 mg once daily  Patient denies adherence with medications, reports missing medications a few times per week, on average. Due to running late to getting to work.   Insurance coverage: [employer] Medicare  Patient reports hypoglycemic events.  Reported home fasting blood sugars: ranges from 85-222 mg/dL   Patient reports nocturia (nighttime urination). 2-3 times a night Patient denies neuropathy (nerve pain). Patient denies visual changes. Patient reports self foot exams.   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   Physical Exam Constitutional:      Appearance: Normal appearance.  Pulmonary:     Effort: Pulmonary effort is normal.  Neurological:     Mental Status: He is alert.  Psychiatric:        Mood and Affect: Mood normal.        Behavior: Behavior normal.        Thought Content: Thought content normal.        Judgment: Judgment normal.    Lab Results  Component Value Date   HGBA1C 9.4 (A) 09/16/2023    Vitals:   09/25/23 1051 09/25/23 1111  BP: (!) 150/88 129/69  Pulse: 60 (!) 51  SpO2: 100%     Lipid Panel     Component Value Date/Time   CHOL 161 12/12/2022 1110   TRIG 84 12/12/2022 1110   HDL 31 (L) 12/12/2022 1110   CHOLHDL 5.2 (H) 12/12/2022 1110   CHOLHDL 4.4 07/22/2016 1201   VLDL 18 07/22/2016 1201   LDLCALC 114 (H) 12/12/2022 1110   LDLDIRECT 107 (H) 04/10/2023 1155    Clinical Atherosclerotic Cardiovascular Disease (ASCVD): No  The 10-year ASCVD risk score (Arnett DK, et al., 2019) is: 34.7%   Values used to calculate the score:     Age: 35 years     Sex: Male     Is Non-Hispanic African American: Yes     Diabetic: Yes     Tobacco smoker: No     Systolic Blood Pressure: 129 mmHg     Is BP treated: Yes     HDL Cholesterol: 31 mg/dL     Total Cholesterol: 161 mg/dL   A/P: Diabetes longstanding currently uncontrolled. Patient is able to verbalize appropriate hypoglycemia management plan. Medication adherence appears okay. Control is suboptimal due to Pt feeling intermittent symptoms of hypoglycemia with FBG ranges from 85-222 mg/dL. - Started Dexcom G7 CGM and paired with receiver - counseled Pt on how to apply sensor and pair with receiver, and interpretation of receiver readings -Patient educated on purpose and proper use of using CGM.  -Extensively discussed  pathophysiology of diabetes, recommended lifestyle interventions, dietary effects on blood sugar control.  -Counseled on s/sx of and management of hypoglycemia.   ASCVD risk - primary prevention in patient with diabetes. Last LDL is 107 not at goal of <09  mg/dL. high intensity statin indicated.  -Continued atorvastatin 80 mg once daily -Consider Ezetimibe in future  Hypertension longstanding currently near goal. Blood pressure goal of <130/80 mmHg. - Continued amlodipine 5 mg once daily, empagliflozin 25 mg once daily, lisinopril 5 mg once daily  Written patient instructions provided. Patient  verbalized understanding of treatment plan.  Total time in face to face counseling 27 minutes.    Follow-up:  Pharmacist 10/09/2023 PCP clinic visit in [place] Patient seen with Shona Simpson, PharmD Candidate.

## 2023-10-09 ENCOUNTER — Encounter: Payer: Self-pay | Admitting: Pharmacist

## 2023-10-09 ENCOUNTER — Ambulatory Visit (INDEPENDENT_AMBULATORY_CARE_PROVIDER_SITE_OTHER): Payer: Medicare HMO | Admitting: Pharmacist

## 2023-10-09 VITALS — BP 118/64 | HR 57 | Wt 155.0 lb

## 2023-10-09 DIAGNOSIS — Z794 Long term (current) use of insulin: Secondary | ICD-10-CM | POA: Diagnosis not present

## 2023-10-09 DIAGNOSIS — E119 Type 2 diabetes mellitus without complications: Secondary | ICD-10-CM | POA: Diagnosis not present

## 2023-10-09 NOTE — Patient Instructions (Addendum)
It was nice to see you today!  Your goal blood sugar is 80-130 before eating and less than 180 after eating.  Medication Changes:  Decrease Basaglar (insulin glargine) from 15 units once daily to 13 units once daily. Can adjust insulin based on glucose readings Work to figure out what bedtime glucose reading is high enough that you don't wake up in the middle of the night with low glucose.   Continue all other medication the same.   Keep up the good work with diet and exercise. Aim for a diet full of vegetables, fruit and lean meats (chicken, [place], fish). Try to limit salt intake by eating fresh or frozen vegetables (instead of canned), rinse canned vegetables prior to cooking and do not add any additional salt to meals.   Eating healthy fats (walnuts, almonds, cashews) for snacking and protein rich meals to avoid hypoglycemic events

## 2023-10-09 NOTE — Progress Notes (Signed)
S:     Chief Complaint  Patient presents with   Medication Management    Diabetes - CGM review - Dexcom G7   67 y.o. male who presents for diabetes evaluation, education, and management. Patient arrives in good spirits and presents without any assistance. Reports not being as thirsty, and going to the bathroom less at night.   Patient was referred and last seen by Primary Care Provider, Dr. Pollie Meyer, on 09/16/2023.   PMH is significant for T2DM, HTN, HLD.  At last visit, Patient suspected low BG requiring eating/drinking. Last pharmacy visit on 09/25/2023 started Dexcom G7 CGM, phone is not compatible and has a reader.    Patient reports Diabetes was diagnosed years ago.   Current diabetes medications include: Basaglar (insulin glargine) 15 units once daily, empagliflozin (Jardiance) 25 mg once daily Current hypertension medications include: amlodipine 5 mg once daily, lisinopril 5 mg once daily Current hyperlipidemia medications include: atorvastatin 80 mg once daily  Patient reports adherence to taking all medications as prescribed.   Do you feel that your medications are working for you? yes Have you been experiencing any side effects to the medications prescribed? Yes - Occasional hypoglycemic events that wake him up Insurance coverage: [employer] Medicare  Patient reports hypoglycemic events. Daily and nightly requiring use of glucose tablets  Patient reports nocturia (nighttime urination). Down to maybe 1-2x nightly Patient reports neuropathy (nerve pain). "Once in a blue moon" Patient denies visual changes. Patient reports self foot exams.   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   Physical Exam Vitals reviewed.   Constitutional:      Appearance: He is normal weight.  Pulmonary:     Effort: Pulmonary effort is normal.  Neurological:     Mental Status: He is alert.  Psychiatric:        Mood and Affect: Mood normal.        Behavior: Behavior normal.        Thought Content: Thought content normal.        Judgment: Judgment normal.     7 day average blood glucose: 135  Dexcom G7 CGM Download today on 10/09/2023 % Time CGM is active: 98.3% Average Glucose: 135 mg/dL Glucose Management Indicator: 6.5%  Glucose Variability: 36.9% (goal <36%) Time in Goal:  - Time in range 70-180: 76% - Time above range: 19% - Time below range: 5%  Lab Results  Component Value Date   HGBA1C 9.4 (A) 09/16/2023   Vitals:   10/09/23 0900  BP: 118/64  Pulse: (!) 57  SpO2: 100%    Lipid Panel     Component Value Date/Time   CHOL 161 12/12/2022 1110   TRIG 84 12/12/2022 1110   HDL 31 (L) 12/12/2022 1110   CHOLHDL 5.2 (H) 12/12/2022 1110   CHOLHDL 4.4 07/22/2016 1201   VLDL 18 07/22/2016 1201   LDLCALC 114 (H) 12/12/2022 1110   LDLDIRECT 107 (H) 04/10/2023 1155    Clinical Atherosclerotic Cardiovascular Disease (ASCVD): No  The 10-year ASCVD risk score (Arnett DK, et al., 2019) is: 30.2%   Values used to calculate the score:     Age: 68 years     Sex: Male     Is Non-Hispanic African American: Yes     Diabetic: Yes  Tobacco smoker: No     Systolic Blood Pressure: 118 mmHg     Is BP treated: Yes     HDL Cholesterol: 31 mg/dL     Total Cholesterol: 161 mg/dL   A/P: Diabetes longstanding currently controlled. Patient is able to verbalize appropriate hypoglycemia management plan. Medication adherence appears good. Control is suboptimal due to hypoglycemic events (time below range 5%) and hyperglycemic events (19%). -Decreased Basaglar (insulin glargine) from 15 units once daily to 13 units once daily -Discussed self titration of insulin based on glucose readings to avoid hypoglycemic events  overnight -Discussed continued dietary adjustments based on glucose readings - Continued empagliflozin (Jardiance) 25 mg once daily.  -Patient educated on purpose, proper use, and potential adverse effects.  -Extensively discussed pathophysiology of diabetes, recommended lifestyle interventions, dietary effects on blood sugar control.  -Counseled on s/sx of and management of hypoglycemia.   ASCVD risk - primary prevention in patient with diabetes. Last LDL is 107 not at goal of <24 mg/dL. ASCVD risk factors include T2DM, HTN, HLD. -Continued atorvastatin 80 mg once daily -Consider new Lipid Panel in early January 2025  Hypertension longstanding currently controlled. Blood pressure goal of <130/80 mmHg. Medication adherence good.  -Continued amlodipine 5 mg once daily, lisinopril 5 mg once daily  Written patient instructions provided. Patient verbalized understanding of treatment plan.  Total time in face to face counseling 26 minutes.    Follow-up:  Pharmacist 11/03/2023 PCP clinic visit in [place] Patient seen with Shona Simpson, PharmD Candidate.

## 2023-10-09 NOTE — Progress Notes (Signed)
Reviewed and agree with Dr Koval's plan.   

## 2023-10-09 NOTE — Assessment & Plan Note (Signed)
Diabetes longstanding currently controlled. Patient is able to verbalize appropriate hypoglycemia management plan. Medication adherence appears good. Control is suboptimal due to hypoglycemic events (time below range 5%) and hyperglycemic events (19%). -Decreased Basaglar (insulin glargine) from 15 units once daily to 13 units once daily -Discussed self titration of insulin based on glucose readings to avoid hypoglycemic events overnight -Discussed continued dietary adjustments based on glucose readings - Continued empagliflozin (Jardiance) 25 mg once daily.  -Patient educated on purpose, proper use, and potential adverse effects.  -Extensively discussed pathophysiology of diabetes, recommended lifestyle interventions, dietary effects on blood sugar control.  -Counseled on s/sx of and management of hypoglycemia.

## 2023-10-20 DIAGNOSIS — N184 Chronic kidney disease, stage 4 (severe): Secondary | ICD-10-CM | POA: Diagnosis not present

## 2023-10-20 DIAGNOSIS — R3129 Other microscopic hematuria: Secondary | ICD-10-CM | POA: Diagnosis not present

## 2023-10-20 DIAGNOSIS — C9000 Multiple myeloma not having achieved remission: Secondary | ICD-10-CM | POA: Diagnosis not present

## 2023-10-20 DIAGNOSIS — R6889 Other general symptoms and signs: Secondary | ICD-10-CM | POA: Diagnosis not present

## 2023-10-20 DIAGNOSIS — E119 Type 2 diabetes mellitus without complications: Secondary | ICD-10-CM | POA: Diagnosis not present

## 2023-10-20 DIAGNOSIS — N400 Enlarged prostate without lower urinary tract symptoms: Secondary | ICD-10-CM | POA: Diagnosis not present

## 2023-10-20 DIAGNOSIS — I129 Hypertensive chronic kidney disease with stage 1 through stage 4 chronic kidney disease, or unspecified chronic kidney disease: Secondary | ICD-10-CM | POA: Diagnosis not present

## 2023-10-20 DIAGNOSIS — R918 Other nonspecific abnormal finding of lung field: Secondary | ICD-10-CM | POA: Diagnosis not present

## 2023-10-20 DIAGNOSIS — N210 Calculus in bladder: Secondary | ICD-10-CM | POA: Diagnosis not present

## 2023-11-03 ENCOUNTER — Ambulatory Visit (INDEPENDENT_AMBULATORY_CARE_PROVIDER_SITE_OTHER): Payer: Medicare HMO | Admitting: Pharmacist

## 2023-11-03 ENCOUNTER — Encounter: Payer: Self-pay | Admitting: Pharmacist

## 2023-11-03 VITALS — BP 133/71 | Wt 151.0 lb

## 2023-11-03 DIAGNOSIS — E119 Type 2 diabetes mellitus without complications: Secondary | ICD-10-CM | POA: Diagnosis not present

## 2023-11-03 DIAGNOSIS — Z794 Long term (current) use of insulin: Secondary | ICD-10-CM

## 2023-11-03 NOTE — Assessment & Plan Note (Signed)
Diabetes longstanding currently slight improvement in variability due to focused effort on decreasing carb (bread) intake.  Fewer low glucose readings recently with use of CGM.  Patient is able to verbalize appropriate hypoglycemia management plan. Medication adherence appears good. -Continued basal insulin Basaglar (insulin glargine) at 14 units once daily.   -Discussed possible future use of rapid insulin to cover a single largest meal of the day.  -Continued SGLT2-I Jardiance (empagliflozin) 25 mg with most recent eGFR of 22 Counseled on sick day rules. -Patient educated on purpose, proper use, and potential adverse effects.

## 2023-11-03 NOTE — Progress Notes (Signed)
Reviewed and agree with Dr Koval's plan.   

## 2023-11-03 NOTE — Patient Instructions (Signed)
It was nice to see you today!  Your goal blood sugar is 80-130 before eating and less than 180 after eating.  Medication Changes:   Continue all other medication the same.   Monitor blood sugars at home and keep a log (glucometer or piece of paper) to bring with you to your next visit.  Keep up the good work with diet and exercise. Aim for a diet full of vegetables, fruit and lean meats (chicken, [place], fish). Try to limit salt intake by eating fresh or frozen vegetables (instead of canned), rinse canned vegetables prior to cooking and do not add any additional salt to meals.

## 2023-11-03 NOTE — Progress Notes (Signed)
S:     Chief Complaint  Patient presents with   Medication Management    Diabetes   67 y.o. male who presents for diabetes evaluation, education, and management. Patient arrives in good spirits and presents without any assistance.  Patient was referred and last seen by Primary Care Provider, Dr. Pollie Meyer, on 10/8/024.  Patient was last seen in pharmacy clinic on 10/09/2023  PMH is significant for T2DM, HTN, HLD, CKD (SCr ~ 3)  At last visit, Insulin glargine was reduced from 15 to 13 units once daily.  Since that visit, patient increased dose to 14 units once daily.    Current diabetes medications include: empagliflozin 25mg  daily and insulin glargine 14 units once daily.  Current hypertension medications include: amlodipine 5mg  and lisinopril 5mg  daily Current hyperlipidemia medications include: atorvastatin 80mg  daily  Patient reports adherence to taking all medications as prescribed.   Do you feel that your medications are working for you? yes Have you been experiencing any side effects to the medications prescribed? no Do you have any problems obtaining medications due to transportation or finances? no Insurance coverage: aetna  Patient denies hypoglycemic events as he has had much fewer low readings.  One episode of overnight lows in the 60s was likely the reason for his < 70 readings on his CGM report.   Patient denies visual changes.   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   Physical Exam Vitals reviewed.  Constitutional:      Appearance: He is normal weight.  Pulmonary:     Effort: Pulmonary effort is normal.  Neurological:     Mental Status: He is alert.  Psychiatric:        Mood and Affect: Mood normal.        Behavior: Behavior normal.        Thought Content: Thought  content normal.        Judgment: Judgment normal.    Dexcom CGM Download today 11/03/2023  % Time CGM is active: 99.2% Average Glucose: 153 mg/dL Glucose Management Indicator: 7.0  Glucose Variability: 29.7% (goal <36%)  (REDUCED from 36.9%) Time in Goal:  - Time in range 70-180: 75% (similar to last 76%) - Time above range: 23% with only 3%> 250mg /dl - Time below range: 2% (reduced from last 5%) Observed patterns:   Lab Results  Component Value Date   HGBA1C 9.4 (A) 09/16/2023   Lipid Panel     Component Value Date/Time   CHOL 161 12/12/2022 1110   TRIG 84 12/12/2022 1110   HDL 31 (L) 12/12/2022 1110   CHOLHDL 5.2 (H) 12/12/2022 1110   CHOLHDL 4.4 07/22/2016 1201   VLDL 18 07/22/2016 1201   LDLCALC 114 (H) 12/12/2022 1110   LDLDIRECT 107 (H) 04/10/2023 1155    Clinical Atherosclerotic Cardiovascular Disease (ASCVD):  The 10-year ASCVD risk score (Arnett DK, et al., 2019) is: 32.7%   Values used to calculate the score:     Age: 36 years     Sex: Male     Is Non-Hispanic African American: Yes     Diabetic: Yes     Tobacco smoker: No     Systolic Blood Pressure: 124 mmHg     Is BP treated: Yes     HDL Cholesterol: 31 mg/dL     Total Cholesterol: 161 mg/dL  A/P: Diabetes longstanding currently slight improvement in variability due to focused effort on decreasing carb (bread) intake.  Fewer low glucose readings recently with use of CGM.  Patient is able to verbalize appropriate hypoglycemia management plan. Medication adherence appears good. -Continued basal insulin Basaglar (insulin glargine) at 14 units once daily.   -Discussed possible future use of rapid insulin to cover a single largest meal of the day.  -Continued SGLT2-I Jardiance (empagliflozin) 25 mg with most recent eGFR of 22 Counseled on sick day rules. -Patient educated on purpose, proper use, and potential adverse effects.  -Extensively discussed pathophysiology of diabetes, recommended lifestyle  interventions, dietary effects on blood sugar control.  -Counseled on s/sx of and management of hypoglycemia.  -Next A1c anticipated 1-3 months.   Written patient instructions provided. Patient verbalized understanding of treatment plan.  Total time in face to face counseling 22 minutes.    Follow-up:  Pharmacist PRN - after PCP appointment PCP clinic visit scheduled with Dr. Pollie Meyer 12/17/2022 (6 weeks) Patient seen with Shona Simpson, PharmD Candidate.

## 2023-11-17 DIAGNOSIS — N184 Chronic kidney disease, stage 4 (severe): Secondary | ICD-10-CM | POA: Diagnosis not present

## 2023-11-24 DIAGNOSIS — E1122 Type 2 diabetes mellitus with diabetic chronic kidney disease: Secondary | ICD-10-CM | POA: Diagnosis not present

## 2023-11-24 DIAGNOSIS — N2581 Secondary hyperparathyroidism of renal origin: Secondary | ICD-10-CM | POA: Diagnosis not present

## 2023-11-24 DIAGNOSIS — N184 Chronic kidney disease, stage 4 (severe): Secondary | ICD-10-CM | POA: Diagnosis not present

## 2023-11-24 DIAGNOSIS — I129 Hypertensive chronic kidney disease with stage 1 through stage 4 chronic kidney disease, or unspecified chronic kidney disease: Secondary | ICD-10-CM | POA: Diagnosis not present

## 2023-11-27 ENCOUNTER — Other Ambulatory Visit: Payer: Self-pay

## 2023-11-27 ENCOUNTER — Other Ambulatory Visit: Payer: Self-pay | Admitting: Student

## 2023-11-27 DIAGNOSIS — E119 Type 2 diabetes mellitus without complications: Secondary | ICD-10-CM

## 2023-11-27 NOTE — Progress Notes (Signed)
**  After Hours/ Emergency Line Call**  Received a page to call [phone]) - [phone].  Patient: Richard Gill.  Caller:  Amy from [facility] Confirmed name & DOB of patient with caller  Subjective:  Received page from [facility] pharmacy. Pharmacist Amy was requesting callback.  Called back number and discussed that patient was visiting [place], and had run out of his Basaglar insulin.  Pharmacy was requesting a refill.  Pharmacy noted they attempted to call the clinic multiple times were unable to get through,and that patient had himself.  Provider attempted to find the pharmacy and send a refill in, but was [place] to find the [place] pharmacy.  Provider then provided a verbal order for refill of his medication  Bess Kinds, MD [facility], PGY-1

## 2023-11-27 NOTE — Telephone Encounter (Signed)
[facility] located in [place] LVM on nurse line requesting a refill on patients Basaglar.   I called the patient to get more information.   He reports he is there for the Holidays and left his Basaglar at home.   He reports he is taking 14 units daily.   Please send in refill to pended [facility].

## 2023-11-28 NOTE — Telephone Encounter (Signed)
This was verbally ordered yesterday by Dr. Barbaraann Faster, will disregard rquest  Latrelle Dodrill, MD

## 2023-12-18 ENCOUNTER — Ambulatory Visit (INDEPENDENT_AMBULATORY_CARE_PROVIDER_SITE_OTHER): Payer: Medicare HMO | Admitting: Family Medicine

## 2023-12-18 ENCOUNTER — Encounter: Payer: Self-pay | Admitting: Family Medicine

## 2023-12-18 VITALS — BP 142/69 | HR 66 | Ht 70.0 in | Wt 152.8 lb

## 2023-12-18 DIAGNOSIS — E785 Hyperlipidemia, unspecified: Secondary | ICD-10-CM | POA: Diagnosis not present

## 2023-12-18 DIAGNOSIS — Z794 Long term (current) use of insulin: Secondary | ICD-10-CM | POA: Diagnosis not present

## 2023-12-18 DIAGNOSIS — N184 Chronic kidney disease, stage 4 (severe): Secondary | ICD-10-CM | POA: Diagnosis not present

## 2023-12-18 DIAGNOSIS — I10 Essential (primary) hypertension: Secondary | ICD-10-CM

## 2023-12-18 DIAGNOSIS — E1122 Type 2 diabetes mellitus with diabetic chronic kidney disease: Secondary | ICD-10-CM

## 2023-12-18 DIAGNOSIS — Z0000 Encounter for general adult medical examination without abnormal findings: Secondary | ICD-10-CM | POA: Diagnosis not present

## 2023-12-18 DIAGNOSIS — E119 Type 2 diabetes mellitus without complications: Secondary | ICD-10-CM

## 2023-12-18 LAB — POCT GLYCOSYLATED HEMOGLOBIN (HGB A1C): HbA1c, POC (controlled diabetic range): 7.4 % — AB (ref 0.0–7.0)

## 2023-12-18 MED ORDER — BASAGLAR KWIKPEN 100 UNIT/ML ~~LOC~~ SOPN: 15.0000 [IU] | PEN_INJECTOR | Freq: Every day | SUBCUTANEOUS | Status: DC

## 2023-12-18 NOTE — Assessment & Plan Note (Signed)
 A1c improved today to 7.4 from 9.4.  Will increase glargine to 15 units daily to achieve slightly better control.  He will continue monitoring with CGM and follow-up with Dr. Raymondo Band.  Checking UACR and renal function today.

## 2023-12-18 NOTE — Assessment & Plan Note (Signed)
Continue statin, check lipids today

## 2023-12-18 NOTE — Patient Instructions (Signed)
 It was great to see you again today.  Go up to 15 units daily of insulin Keep following with Dr. Raymondo Band Checking bloodwork today Next visit with me in 3 months to recheck A1c, sooner if needed  Be well, Dr. Pollie Meyer

## 2023-12-18 NOTE — Assessment & Plan Note (Signed)
 Urine albumin creatinine ratio obtained today

## 2023-12-18 NOTE — Assessment & Plan Note (Signed)
 Blood pressure mildly elevated on recheck today, endorses values at home being less than 140/90.  Continue present medications, he will continue monitoring at home.

## 2023-12-18 NOTE — Assessment & Plan Note (Signed)
 Continues to follow-up regularly with nephrology.  Updating renal function today.

## 2023-12-18 NOTE — Progress Notes (Signed)
  Date of Visit: 12/18/2023   SUBJECTIVE:   HPI:  Richard Gill presents today for routine follow-up.  Diabetes: Currently taking insulin  glargine 14 units daily and empagliflozin  25 mg daily.  Has CGM which has been working quite well for him.  Denies any significant low sugars, reports recently it has been going higher when he eats.  Has been following with Dr. Koval.  A1c today 7.4, down from 9.4.  Hypertension: Currently taking amlodipine  5 mg daily and lisinopril  5 mg daily.  Endorses compliance with these medications, tolerating them well.  Hyperlipidemia: Currently taking atorvastatin  80 mg daily.  Tolerating this well.  He is fasting today.   OBJECTIVE:   BP (!) 162/84   Pulse 66   Ht 5' 10 (1.778 m)   Wt 152 lb 12.8 oz (69.3 kg)   SpO2 100%   BMI 21.92 kg/m  Gen: No acute distress, pleasant, cooperative, well-appearing HEENT: Normocephalic, atraumatic Heart: Regular rate and rhythm, no murmur Lungs: Clear to auscultation bilaterally, normal work of breathing Neuro: Grossly nonfocal, speech normal Ext: No edema bilateral lower extremities  ASSESSMENT/PLAN:   Assessment & Plan Type 2 diabetes mellitus with stage 4 chronic kidney disease, with long-term current use of insulin  (HCC) A1c improved today to 7.4 from 9.4.  Will increase glargine to 15 units daily to achieve slightly better control.  He will continue monitoring with CGM and follow-up with Dr. Koval.  Checking UACR and renal function today. Routine adult health maintenance Urine albumin creatinine ratio obtained today Hyperlipidemia, unspecified hyperlipidemia type Continue statin, check lipids today Hypertension, unspecified type Blood pressure mildly elevated on recheck today, endorses values at home being less than 140/90.  Continue present medications, he will continue monitoring at home. CKD (chronic kidney disease), stage IV (HCC) Continues to follow-up regularly with nephrology.  Updating renal function  today.    FOLLOW UP: Follow up in 3 months for next A1c  Richard Chalfant J. Donah, MD [facility]

## 2023-12-19 LAB — LIPID PANEL
Chol/HDL Ratio: 5.1 {ratio} — ABNORMAL HIGH (ref 0.0–5.0)
Cholesterol, Total: 143 mg/dL (ref 100–199)
HDL: 28 mg/dL — ABNORMAL LOW (ref >39)
LDL Chol Calc (NIH): 103 mg/dL — ABNORMAL HIGH (ref 0–99)
Triglycerides: 56 mg/dL (ref 0–149)
VLDL Cholesterol Cal: 12 mg/dL (ref 5–40)

## 2023-12-19 LAB — CMP14+EGFR
ALT: 17 [IU]/L (ref 0–44)
AST: 20 [IU]/L (ref 0–40)
Albumin: 4.4 g/dL (ref 3.9–4.9)
Alkaline Phosphatase: 242 [IU]/L — ABNORMAL HIGH (ref 44–121)
BUN/Creatinine Ratio: 12 (ref 10–24)
BUN: 36 mg/dL — ABNORMAL HIGH (ref 8–27)
Bilirubin Total: 0.5 mg/dL (ref 0.0–1.2)
CO2: 18 mmol/L — ABNORMAL LOW (ref 20–29)
Calcium: 8.4 mg/dL — ABNORMAL LOW (ref 8.6–10.2)
Chloride: 106 mmol/L (ref 96–106)
Creatinine, Ser: 2.92 mg/dL — ABNORMAL HIGH (ref 0.76–1.27)
Globulin, Total: 2.9 g/dL (ref 1.5–4.5)
Glucose: 162 mg/dL — ABNORMAL HIGH (ref 70–99)
Potassium: 4.5 mmol/L (ref 3.5–5.2)
Sodium: 141 mmol/L (ref 134–144)
Total Protein: 7.3 g/dL (ref 6.0–8.5)
eGFR: 23 mL/min/{1.73_m2} — ABNORMAL LOW (ref >59)

## 2023-12-22 LAB — MICROALBUMIN / CREATININE URINE RATIO
Creatinine, Urine: 65.9 mg/dL
Microalb/Creat Ratio: 231 mg/g{creat} — ABNORMAL HIGH (ref 0–29)
Microalbumin, Urine: 152 ug/mL

## 2024-01-16 ENCOUNTER — Other Ambulatory Visit: Payer: Self-pay | Admitting: Family Medicine

## 2024-01-29 ENCOUNTER — Other Ambulatory Visit: Payer: Self-pay | Admitting: Family Medicine

## 2024-02-06 ENCOUNTER — Other Ambulatory Visit: Payer: Self-pay | Admitting: Family Medicine

## 2024-02-19 ENCOUNTER — Other Ambulatory Visit: Payer: Self-pay | Admitting: *Deleted

## 2024-02-19 DIAGNOSIS — E119 Type 2 diabetes mellitus without complications: Secondary | ICD-10-CM

## 2024-02-19 MED ORDER — BASAGLAR KWIKPEN 100 UNIT/ML ~~LOC~~ SOPN: 15.0000 [IU] | PEN_INJECTOR | Freq: Every day | SUBCUTANEOUS | 2 refills | Status: DC

## 2024-03-12 DIAGNOSIS — N184 Chronic kidney disease, stage 4 (severe): Secondary | ICD-10-CM | POA: Diagnosis not present

## 2024-03-18 ENCOUNTER — Other Ambulatory Visit: Payer: Self-pay

## 2024-03-18 DIAGNOSIS — E119 Type 2 diabetes mellitus without complications: Secondary | ICD-10-CM

## 2024-03-18 MED ORDER — EMPAGLIFLOZIN 25 MG PO TABS: 25.0000 mg | ORAL_TABLET | Freq: Every day | ORAL | 3 refills | Status: AC

## 2024-03-25 DIAGNOSIS — I129 Hypertensive chronic kidney disease with stage 1 through stage 4 chronic kidney disease, or unspecified chronic kidney disease: Secondary | ICD-10-CM | POA: Diagnosis not present

## 2024-03-25 DIAGNOSIS — N2581 Secondary hyperparathyroidism of renal origin: Secondary | ICD-10-CM | POA: Diagnosis not present

## 2024-03-25 DIAGNOSIS — E1122 Type 2 diabetes mellitus with diabetic chronic kidney disease: Secondary | ICD-10-CM | POA: Diagnosis not present

## 2024-03-25 DIAGNOSIS — N184 Chronic kidney disease, stage 4 (severe): Secondary | ICD-10-CM | POA: Diagnosis not present

## 2024-04-06 ENCOUNTER — Other Ambulatory Visit: Payer: Self-pay | Admitting: Family Medicine

## 2024-04-19 DIAGNOSIS — R3129 Other microscopic hematuria: Secondary | ICD-10-CM | POA: Diagnosis not present

## 2024-04-19 DIAGNOSIS — R6889 Other general symptoms and signs: Secondary | ICD-10-CM | POA: Diagnosis not present

## 2024-04-19 DIAGNOSIS — E1122 Type 2 diabetes mellitus with diabetic chronic kidney disease: Secondary | ICD-10-CM | POA: Diagnosis not present

## 2024-04-19 DIAGNOSIS — N402 Nodular prostate without lower urinary tract symptoms: Secondary | ICD-10-CM | POA: Diagnosis not present

## 2024-04-19 DIAGNOSIS — N184 Chronic kidney disease, stage 4 (severe): Secondary | ICD-10-CM | POA: Diagnosis not present

## 2024-04-19 DIAGNOSIS — I129 Hypertensive chronic kidney disease with stage 1 through stage 4 chronic kidney disease, or unspecified chronic kidney disease: Secondary | ICD-10-CM | POA: Diagnosis not present

## 2024-04-19 DIAGNOSIS — D472 Monoclonal gammopathy: Secondary | ICD-10-CM | POA: Diagnosis not present

## 2024-04-19 DIAGNOSIS — N210 Calculus in bladder: Secondary | ICD-10-CM | POA: Diagnosis not present

## 2024-04-19 DIAGNOSIS — I1310 Hypertensive heart and chronic kidney disease without heart failure, with stage 1 through stage 4 chronic kidney disease, or unspecified chronic kidney disease: Secondary | ICD-10-CM | POA: Diagnosis not present

## 2024-04-19 DIAGNOSIS — R918 Other nonspecific abnormal finding of lung field: Secondary | ICD-10-CM | POA: Diagnosis not present

## 2024-04-19 DIAGNOSIS — N401 Enlarged prostate with lower urinary tract symptoms: Secondary | ICD-10-CM | POA: Diagnosis not present

## 2024-05-21 ENCOUNTER — Other Ambulatory Visit: Payer: Self-pay | Admitting: Urology

## 2024-05-21 DIAGNOSIS — N281 Cyst of kidney, acquired: Secondary | ICD-10-CM

## 2024-05-24 ENCOUNTER — Encounter: Payer: Self-pay | Admitting: Urology

## 2024-05-31 ENCOUNTER — Other Ambulatory Visit: Payer: Self-pay | Admitting: Family Medicine

## 2024-06-01 ENCOUNTER — Encounter: Payer: Self-pay | Admitting: Family Medicine

## 2024-06-01 ENCOUNTER — Ambulatory Visit (INDEPENDENT_AMBULATORY_CARE_PROVIDER_SITE_OTHER): Admitting: Family Medicine

## 2024-06-01 VITALS — BP 138/85 | HR 80 | Ht 70.0 in | Wt 153.4 lb

## 2024-06-01 DIAGNOSIS — N184 Chronic kidney disease, stage 4 (severe): Secondary | ICD-10-CM

## 2024-06-01 DIAGNOSIS — E1122 Type 2 diabetes mellitus with diabetic chronic kidney disease: Secondary | ICD-10-CM

## 2024-06-01 DIAGNOSIS — I10 Essential (primary) hypertension: Secondary | ICD-10-CM

## 2024-06-01 DIAGNOSIS — Z794 Long term (current) use of insulin: Secondary | ICD-10-CM | POA: Diagnosis not present

## 2024-06-01 DIAGNOSIS — K30 Functional dyspepsia: Secondary | ICD-10-CM | POA: Diagnosis not present

## 2024-06-01 DIAGNOSIS — M546 Pain in thoracic spine: Secondary | ICD-10-CM

## 2024-06-01 LAB — POCT GLYCOSYLATED HEMOGLOBIN (HGB A1C): HbA1c, POC (controlled diabetic range): 8.3 % — AB (ref 0.0–7.0)

## 2024-06-01 NOTE — Progress Notes (Signed)
 SABRA

## 2024-06-01 NOTE — Assessment & Plan Note (Signed)
 Uncontrolled with A1c of 8.3.  Challenging to adjust insulin  given inconsistency of evening meals.  I worry about dropping him low, fortunately does have a CGM.  Reviewed options for management with patient today, and ultimately he decided he would like to meet with Dr. Koval to discuss adjustments.

## 2024-06-01 NOTE — Assessment & Plan Note (Deleted)
 [email or web address]

## 2024-06-01 NOTE — Patient Instructions (Addendum)
 It was great to see you again today.  Schedule with Dr. Koval for your diabetes Continue present medications  Follow up with me in 3 months after you see Dr. Amalia to recheck your A1c  Be well, Dr. Donah

## 2024-06-01 NOTE — Progress Notes (Signed)
  Date of Visit: 06/01/2024   SUBJECTIVE:   HPI:  Richard Gill presents today for follow-up.  Diabetes: Currently taking Lantus  20 units daily.  Also takes Jardiance  25 mg daily tolerating these well.  Uses a CGM.  Fasting sugars vary, sometimes as low as 70, others in the low 100s, other times higher into 150 and above.  He is not eating consistently in the evenings.  When he does eat in the evenings that is when his sugar tends to be high in the morning.  Has not seen Dr. Koval since November.  Indigestion: About twice has had episodes of pretty bad indigestion.  Most recent time occurred for 4 to 5 days.  He got an over-the-counter reflux medicine, cannot recall the name of it but it helped a lot.  He is no longer having this issue and is not taking that medicine anymore.  Back pain: Reports having intermittent muscle pain in his mid to upper back for several months.  It comes and goes.  Denies incontinence.   OBJECTIVE:   BP 138/85   Pulse 80   Ht 5' 10 (1.778 m)   Wt 153 lb 6.4 oz (69.6 kg)   SpO2 99%   BMI 22.01 kg/m  Gen: No acute distress, pleasant, cooperative, well-appearing HEENT: Normocephalic, atraumatic Heart: Regular rate and rhythm, no murmur Lungs: Clear bilaterally, normal effort Back: No paraspinal or midline tenderness over thoracic or upper lumbar back.  Full range of motion with turning side-to-side. Neuro: Alert, grossly nonfocal, speech normal Abdomen: Soft, nontender to palpation, no masses or organomegaly Ext: No edema  ASSESSMENT/PLAN:   Assessment & Plan Type 2 diabetes mellitus with stage 4 chronic kidney disease, with long-term current use of insulin  (HCC) Uncontrolled with A1c of 8.3.  Challenging to adjust insulin  given inconsistency of evening meals.  I worry about dropping him low, fortunately does have a CGM.  Reviewed options for management with patient today, and ultimately he decided he would like to meet with Dr. Koval to discuss  adjustments. Thoracic back pain, unspecified back pain laterality, unspecified chronicity Seems relatively mild in nature.  Normal back exam today.  Not currently an issue.  Monitor. Indigestion Improved with over-the-counter medication, name unknown.  No longer needing this medicine, doing well.  Continue to monitor.    FOLLOW UP: Follow up with Dr. Koval for diabetes  Laymon PARAS. Donah, MD [facility]

## 2024-06-07 ENCOUNTER — Ambulatory Visit (INDEPENDENT_AMBULATORY_CARE_PROVIDER_SITE_OTHER)

## 2024-06-07 VITALS — Ht 70.0 in | Wt 155.0 lb

## 2024-06-07 DIAGNOSIS — Z0000 Encounter for general adult medical examination without abnormal findings: Secondary | ICD-10-CM

## 2024-06-07 NOTE — Patient Instructions (Signed)
 Mr. Yasui , Thank you for taking time out of your busy schedule to complete your Annual Wellness Visit with me. I enjoyed our conversation and look forward to speaking with you again next year. I, as well as your care team,  appreciate your ongoing commitment to your health goals. Please review the following plan we discussed and let me know if I can assist you in the future. Your Game plan/ To Do List    Referrals: If you haven't heard from the office you've been referred to, please reach out to them at the phone provided.   Follow up Visits: Next Medicare AWV with our clinical staff: 06/09/2025 at 11:50 a.m. phone visit with Nurse health Advisor   Have you seen your provider in the last 6 months (3 months if uncontrolled diabetes)? Yes Next Office Visit with your provider: in 3 months  Clinician Recommendations:  Aim for 30 minutes of exercise or brisk walking, 6-8 glasses of water, and 5 servings of fruits and vegetables each day.       This is a list of the screening recommended for you and due dates:  Health Maintenance  Topic Date Due   Zoster (Shingles) Vaccine (2 of 2) 01/08/2023   Complete foot exam   04/26/2023   COVID-19 Vaccine (8 - Pfizer risk 2024-25 season) 03/16/2024   Eye exam for diabetics  06/17/2024   Flu Shot  07/09/2024   Hemoglobin A1C  12/01/2024   Yearly kidney function blood test for diabetes  12/17/2024   Yearly kidney health urinalysis for diabetes  12/17/2024   Medicare Annual Wellness Visit  06/07/2025   Colon Cancer Screening  06/09/2025   DTaP/Tdap/Td vaccine (2 - Td or Tdap) 04/25/2032   Pneumococcal Vaccine for age over 21  Completed   Hepatitis C Screening  Completed   Hepatitis B Vaccine  Aged Out   HPV Vaccine  Aged Out   Meningitis B Vaccine  Aged Out    Advanced directives: (Declined) Advance directive discussed with you today. Even though you declined this today, please call our office should you change your mind, and we can give you the proper  paperwork for you to fill out. Advance Care Planning is important because it:  [x]  Makes sure you receive the medical care that is consistent with your values, goals, and preferences  [x]  It provides guidance to your family and loved ones and reduces their decisional burden about whether or not they are making the right decisions based on your wishes.  Follow the link provided in your after visit summary or read over the paperwork we have mailed to you to help you started getting your Advance Directives in place. If you need assistance in completing these, please reach out to us  so that we can help you!  See attachments for Preventive Care and Fall Prevention Tips.

## 2024-06-07 NOTE — Progress Notes (Signed)
 SUBJECTIVE:Because this visit was a virtual/telehealth visit,  certain criteria was not obtained, such a blood pressure, CBG if applicable, and timed get up and go. Any medications not marked as taking were not mentioned during the medication reconciliation part of the visit. Any vitals not documented were not able to be obtained due to this being a telehealth visit or patient was unable to self-report a recent blood pressure reading due to a lack of equipment at home via telehealth. Vitals that have been documented are verbally provided by the patient.   Subjective:   Richard Gill. is a 68 y.o. who presents for a Medicare Wellness preventive visit.  As a reminder, Annual Wellness Visits don't include a physical exam, and some assessments may be limited, especially if this visit is performed virtually. We may recommend an in-person follow-up visit with your provider if needed.  Visit Complete: Virtual I connected with  Richard Gill. on 06/07/24 by a audio enabled telemedicine application and verified that I am speaking with the correct person using two identifiers.  Patient Location: Home  Provider Location: Office/Clinic  I discussed the limitations of evaluation and management by telemedicine. The patient expressed understanding and agreed to proceed.  Vital Signs: Because this visit was a virtual/telehealth visit, some criteria may be missing or patient reported. Any vitals not documented were not able to be obtained and vitals that have been documented are patient reported.  VideoDeclined- This patient declined Librarian, academic. Therefore the visit was completed with audio only.  Persons Participating in Visit: Patient.  AWV Questionnaire: No: Patient Medicare AWV questionnaire was not completed prior to this visit.  Cardiac Risk Factors include: advanced age (>34men, >59 women);diabetes mellitus;male gender;dyslipidemia;sedentary lifestyle;family  history of premature cardiovascular disease     Objective:    Today's Vitals   06/07/24 1156  Weight: 155 lb (70.3 kg)  Height: 5' 10 (1.778 m)  PainSc: 0-No pain   Body mass index is 22.24 kg/m.     06/07/2024   11:59 AM 06/01/2024    8:57 AM 12/18/2023    9:02 AM 05/23/2023    9:47 AM 04/25/2022    9:40 AM 09/19/2021    1:05 PM 09/07/2021    7:16 AM  Advanced Directives  Does Patient Have a Medical Advance Directive? No No No No No No No  Would patient like information on creating a medical advance directive? No - Patient declined No - Patient declined No - Patient declined No - Patient declined No - Patient declined No - Patient declined No - Patient declined    Current Medications (verified) Outpatient Encounter Medications as of 06/07/2024  Medication Sig   amLODipine  (NORVASC ) 5 MG tablet TAKE 1 TABLET BY MOUTH AT BEDTIME   aspirin  EC 81 MG tablet Take 81 mg by mouth daily. Swallow whole.   atorvastatin  (LIPITOR) 80 MG tablet Take 1 tablet (80 mg total) by mouth daily.   Coenzyme Q10 (COQ10 PO) Take by mouth.   Continuous Glucose Sensor (DEXCOM G7 SENSOR) MISC APPLY 1 SENSOR EVERY 10 DAYS   empagliflozin  (JARDIANCE ) 25 MG TABS tablet Take 1 tablet (25 mg total) by mouth daily.   Ginger, Zingiber officinalis, (GINGER PO) Take by mouth.   hydrOXYzine  (ATARAX ) 25 MG tablet TAKE 1 TABLET BY MOUTH ONCE DAILY AS NEEDED FOR ITCHING   Insulin  Glargine (BASAGLAR  KWIKPEN) 100 UNIT/ML Inject 15 Units into the skin daily. (Patient taking differently: Inject 20 Units into the skin daily.)  lisinopril  (ZESTRIL ) 5 MG tablet Take 5 mg by mouth daily.   Multiple Vitamin (MULTIVITAMIN WITH MINERALS) TABS tablet Take 1 tablet by mouth daily.   RELION PEN NEEDLES 31G X 6 MM MISC USE 1  ONCE DAILY   TURMERIC PO Take by mouth.   No facility-administered encounter medications on file as of 06/07/2024.    Allergies (verified) Smz-tmp ds [sulfamethoxazole -trimethoprim ], Magnesium-containing  compounds, and Trulicity  [dulaglutide ]   History: Past Medical History:  Diagnosis Date   BPH (benign prostatic hyperplasia)    Chronic kidney disease, stage II (mild)    Dr. 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    Prostatitis    Sickle cell trait (HCC)    T2DM (type 2 diabetes mellitus) (HCC) 2005   lantus  x1 year   Past Surgical History:  Procedure Laterality Date   BUNIONECTOMY Bilateral 08-25-13   COLONOSCOPY  2014   Dr. Rollin, was told to f/u in 2017-2019   CYSTOSCOPY WITH FULGERATION N/A 09/07/2021   Procedure: CYSTOSCOPY WITH FULGERATION;  Surgeon: Francisca Redell BROCKS, MD;  Location: [facility];  Service: Urology;  Laterality: N/A;   HOLEP-LASER ENUCLEATION OF THE PROSTATE WITH MORCELLATION N/A 09/07/2021   Procedure: HOLEP-LASER ENUCLEATION OF THE PROSTATE WITH MORCELLATION;  Surgeon: Francisca Redell BROCKS, MD;  Location: [facility];  Service: Urology;  Laterality: N/A;   TRANSURETHRAL RESECTION OF PROSTATE N/A 09/06/2013   Procedure: TRANSURETHRAL RESECTION OF THE PROSTATE WITH GYRUS INSTRUMENTS;  Surgeon: Mark C Ottelin, MD;  Location: [facility];  Service: Urology;  Laterality: N/A;   Family History  Problem Relation Age of Onset   Heart attack Father 36   Stroke Mother    Colon cancer Sister 20   Colon cancer Sister 72   Social History   Socioeconomic History   Marital status: Married    Spouse name: Scientist, product/process development    Number of children: 8   Years of education: 12   Highest education level: GED or equivalent  Occupational History    Employer: [employer]    Comment: Building maintenance  Tobacco Use   Smoking status: Former    Current packs/day: 0.00    Average packs/day: 1 pack/day for 10.0 years (10.0 ttl pk-yrs)    Types: Cigarettes    Start date: 01/28/1981    Quit date: 01/28/1991    Years since quitting: 33.3   Smokeless tobacco: Never  Vaping Use    Vaping status: Never Used  Substance and Sexual Activity   Alcohol use: No   Drug use: No   Sexual activity: Yes    Partners: Female    Birth control/protection: Post-menopausal  Other Topics Concern   Not on file  Social History Narrative   Lives with wife Dorothe in [place].     Enjoys fishing, gardening, working on classic cars, and spending time with family.    Patient has 8 children combined with his wife.    Patient has 1 fish and 1 dog.   High school graduate. Former smoker, quit >20 years ago. No drugs or EtOH. No regular exercise.   Social Drivers of Corporate investment banker Strain: Low Risk  (06/07/2024)   Overall Financial Resource Strain (CARDIA)    Difficulty of Paying Living Expenses: Not hard at all  Food Insecurity: No Food Insecurity (06/07/2024)   Hunger Vital Sign    Worried About Running Out of Food in the  Last Year: Never true    Ran Out of Food in the Last Year: Never true  Transportation Needs: No Transportation Needs (06/07/2024)   PRAPARE - Administrator, Civil Service (Medical): No    Lack of Transportation (Non-Medical): No  Physical Activity: Sufficiently Active (06/07/2024)   Exercise Vital Sign    Days of Exercise per Week: 5 days    Minutes of Exercise per Session: 150+ min  Stress: No Stress Concern Present (06/07/2024)   [employer] of Occupational Health - Occupational Stress Questionnaire    Feeling of Stress: Not at all  Social Connections: Socially Integrated (06/07/2024)   Social Connection and Isolation Panel    Frequency of Communication with Friends and Family: Twice a week    Frequency of Social Gatherings with Friends and Family: More than three times a week    Attends Religious Services: More than 4 times per year    Active Member of [employer] or Organizations: Yes    Attends Banker Meetings: Never    Marital Status: Married    Tobacco Counseling Counseling given: Not Answered    Clinical  Intake:  Pre-visit preparation completed: Yes  Pain : No/denies pain Pain Score: 0-No pain     BMI - recorded: 22.24 Nutritional Status: BMI of 19-24  Normal Nutritional Risks: None Diabetes: Yes CBG done?: No Did pt. bring in CBG monitor from home?: No  Lab Results  Component Value Date   HGBA1C 8.3 (A) 06/01/2024   HGBA1C 7.4 (A) 12/18/2023   HGBA1C 9.4 (A) 09/16/2023     How often do you need to have someone help you when you read instructions, pamphlets, or other written materials from your doctor or pharmacy?: 1 - Never What is the last grade level you completed in school?: HSG  Interpreter Needed?: No  Information entered by :: Adonai Selsor N. Freddi Forster, LPN.   Activities of Daily Living     06/07/2024   12:00 PM  In your present state of health, do you have any difficulty performing the following activities:  Hearing? 0  Vision? 0  Difficulty concentrating or making decisions? 0  Walking or climbing stairs? 0  Dressing or bathing? 0  Doing errands, shopping? 0  Preparing Food and eating ? N  Using the Toilet? N  In the past six months, have you accidently leaked urine? N  Do you have problems with loss of bowel control? N  Managing your Medications? N  Managing your Finances? N  Housekeeping or managing your Housekeeping? N    Patient Care Team: Donah Laymon PARAS, MD as PCP - General (Pediatrics) Nichole Coy, MD as PCP - Hematology/Oncology (Internal Medicine) Douglass Lunger, MD (Nephrology) Twana Jeneal DASEN, MD (Hematology and Oncology) Ottelin, Mark, MD (Inactive) as Attending Physician (Urology) Rollin Dover, MD as Attending Physician (Gastroenterology) Leslee Reusing, MD as Consulting Physician (Ophthalmology)  I have updated your Care Teams any recent Medical Services you may have received from other providers in the past year.     Assessment:   This is a routine wellness examination for Kimoni.  Hearing/Vision screen Hearing Screening -  Comments:: Denies hearing difficulties.  Vision Screening - Comments:: Wears reading glasses - up to date with routine eye exams with Reusing Leslee, MD.    Goals Addressed               This Visit's Progress     CCM:  Monitor and Maintain HbA1c <7% (pt-stated)  Continue to eat healthy and drink plenty of water.  But I do love my occasional chocolate donut.       Depression Screen     06/07/2024   12:00 PM 06/01/2024    8:57 AM 12/18/2023    9:02 AM 09/16/2023   11:51 AM 05/26/2023    8:56 AM 05/23/2023    9:41 AM 05/23/2023    9:40 AM  PHQ 2/9 Scores  PHQ - 2 Score 0 0 0 0 0 0 0  PHQ- 9 Score 0 0 0 0 0 0     Fall Risk     06/07/2024   11:59 AM 06/01/2024    8:57 AM 12/18/2023    9:02 AM 05/26/2023    8:56 AM 05/23/2023    9:40 AM  Fall Risk   Falls in the past year? 0 0 0 0 0  Number falls in past yr: 0 0 0 0 0  Injury with Fall? 0 0 0 0 0  Risk for fall due to : No Fall Risks No Fall Risks No Fall Risks  No Fall Risks  Follow up Falls evaluation completed Falls evaluation completed Falls evaluation completed  Falls evaluation completed    MEDICARE RISK AT HOME:  Medicare Risk at Home Any stairs in or around the home?: No If so, are there any without handrails?: No Home free of loose throw rugs in walkways, pet beds, electrical cords, etc?: Yes Adequate lighting in your home to reduce risk of falls?: Yes Life alert?: No Use of a cane, walker or w/c?: No Grab bars in the bathroom?: Yes Shower chair or bench in shower?: No Elevated toilet seat or a handicapped toilet?: Yes  TIMED UP AND GO:  Was the test performed?  No  Cognitive Function: 6CIT completed    06/07/2024   12:02 PM  MMSE - Mini Mental State Exam  Not completed: Unable to complete        06/07/2024   12:01 PM 05/23/2023    9:43 AM 04/18/2021   11:32 AM  6CIT Screen  What Year? 0 points 0 points 0 points  What month? 0 points 0 points 0 points  What time? 0 points 0 points 0 points   Count back from 20 0 points 0 points 0 points  Months in reverse 0 points 0 points 0 points  Repeat phrase 0 points 0 points 0 points  Total Score 0 points 0 points 0 points    Immunizations Immunization History  Administered Date(s) Administered   Fluad Quad(high Dose 65+) 11/16/2019, 11/14/2020, 10/04/2021, 12/12/2022   Fluad Trivalent(High Dose 65+) 09/16/2023   PFIZER Comirnaty(Gray Top)Covid-19 Tri-Sucrose Vaccine 04/12/2021   PFIZER(Purple Top)SARS-COV-2 Vaccination 05/01/2020, 05/22/2020, 12/05/2020   PNEUMOCOCCAL CONJUGATE-20 09/16/2023   Pfizer Covid-19 Vaccine Bivalent Booster 57yrs & up 10/04/2021   Pfizer(Comirnaty)Fall Seasonal Vaccine 12 years and older 12/12/2022, 09/16/2023   Pneumococcal Conjugate-13 05/01/2018   Pneumococcal Polysaccharide-23 07/06/2018   Tdap 04/25/2022   Zoster Recombinant(Shingrix ) 11/13/2022    Screening Tests Health Maintenance  Topic Date Due   Zoster Vaccines- Shingrix  (2 of 2) 01/08/2023   FOOT EXAM  04/26/2023   COVID-19 Vaccine (8 - Pfizer risk 2024-25 season) 03/16/2024   OPHTHALMOLOGY EXAM  06/17/2024   INFLUENZA VACCINE  07/09/2024   HEMOGLOBIN A1C  12/01/2024   Diabetic kidney evaluation - eGFR measurement  12/17/2024   Diabetic kidney evaluation - Urine ACR  12/17/2024   Medicare Annual Wellness (AWV)  06/07/2025   Colonoscopy  06/09/2025  DTaP/Tdap/Td (2 - Td or Tdap) 04/25/2032   Pneumococcal Vaccine: 50+ Years  Completed   Hepatitis C Screening  Completed   Hepatitis B Vaccines  Aged Out   HPV VACCINES  Aged Out   Meningococcal B Vaccine  Aged Out    Health Maintenance  Health Maintenance Due  Topic Date Due   Zoster Vaccines- Shingrix  (2 of 2) 01/08/2023   FOOT EXAM  04/26/2023   COVID-19 Vaccine (8 - Pfizer risk 2024-25 season) 03/16/2024   Health Maintenance Items Addressed: Yes Patient aware of current care gaps.  Immunization record was verified by [employer].  Patient is due for Diabetic Foot Exam, Shingrix   and Covid Vaccines.  Additional Screening:  Vision Screening: Recommended annual ophthalmology exams for early detection of glaucoma and other disorders of the eye. Would you like a referral to an eye doctor? No    Dental Screening: Recommended annual dental exams for proper oral hygiene  Community Resource Referral / Chronic Care Management: CRR required this visit?  No   CCM required this visit?  No   Plan:    I have personally reviewed and noted the following in the patient's chart:   Medical and social history Use of alcohol, tobacco or illicit drugs  Current medications and supplements including opioid prescriptions. Patient is not currently taking opioid prescriptions. Functional ability and status Nutritional status Physical activity Advanced directives List of other physicians Hospitalizations, surgeries, and ER visits in previous 12 months Vitals Screenings to include cognitive, depression, and falls Referrals and appointments  In addition, I have reviewed and discussed with patient certain preventive protocols, quality metrics, and best practice recommendations. A written personalized care plan for preventive services as well as general preventive health recommendations were provided to patient.   Roz LOISE Fuller, LPN   [id number]   After Visit Summary: (MyChart) Due to this being a telephonic visit, the after visit summary with patients personalized plan was offered to patient via MyChart   Notes: Patient aware of current care gaps.  Immunization record was verified by [employer].  Patient is due for Diabetic Foot Exam, Shingrix  and Covid Vaccines.

## 2024-06-14 ENCOUNTER — Telehealth: Payer: Self-pay | Admitting: Pharmacist

## 2024-06-14 NOTE — Telephone Encounter (Signed)
 Reviewed and agree with Dr Macky Lower plan.

## 2024-06-14 NOTE — Telephone Encounter (Signed)
 Patient contacted for follow-up of diabetes   Since last contact patient reports many readings in the lower 100s on his Dexcom receiver.   Patient willing to schedule later this week for Receiver CGM review and potential medication adjustment.  Scheduled appointment later this week - 7/10 at 9:00AM  Total time with patient call and documentation of interaction: 9 minutes.

## 2024-06-17 ENCOUNTER — Encounter: Payer: Self-pay | Admitting: Pharmacist

## 2024-06-17 ENCOUNTER — Ambulatory Visit (INDEPENDENT_AMBULATORY_CARE_PROVIDER_SITE_OTHER): Admitting: Pharmacist

## 2024-06-17 VITALS — BP 126/64 | HR 57 | Wt 153.6 lb

## 2024-06-17 DIAGNOSIS — E1122 Type 2 diabetes mellitus with diabetic chronic kidney disease: Secondary | ICD-10-CM | POA: Diagnosis not present

## 2024-06-17 DIAGNOSIS — E119 Type 2 diabetes mellitus without complications: Secondary | ICD-10-CM

## 2024-06-17 DIAGNOSIS — N184 Chronic kidney disease, stage 4 (severe): Secondary | ICD-10-CM

## 2024-06-17 DIAGNOSIS — Z794 Long term (current) use of insulin: Secondary | ICD-10-CM

## 2024-06-17 MED ORDER — FIASP FLEXTOUCH 100 UNIT/ML ~~LOC~~ SOPN
3.0000 [IU] | PEN_INJECTOR | Freq: Every day | SUBCUTANEOUS | 1 refills | Status: DC
Start: 2024-06-17 — End: 2024-07-26

## 2024-06-17 MED ORDER — BASAGLAR KWIKPEN 100 UNIT/ML ~~LOC~~ SOPN: 14.0000 [IU] | PEN_INJECTOR | Freq: Every day | SUBCUTANEOUS | 2 refills | Status: DC

## 2024-06-17 NOTE — Patient Instructions (Signed)
 It was nice to see you today!  You are doing fantastic!  Your goal blood sugar is 80-130 before eating and less than 180 after eating.  Medication Changes: START Fiasp  (insulin  aspart) 3 units prior to your evening meal  Decrease Basaglar  (insulin  glargine) to 14 units once daily in the AM.  If you are noticing continued lows please decrease this to 12 units  Continue all other medication the same.   Keep up the good work with diet and exercise. Aim for a diet full of vegetables, fruit and lean meats (chicken, [place], fish). Try to limit salt intake by eating fresh or frozen vegetables (instead of canned), rinse canned vegetables prior to cooking and do not add any additional salt to meals.

## 2024-06-17 NOTE — Assessment & Plan Note (Signed)
 Diabetes longstanding currently high variability (40%) on CGM likely due to need for prandial insulin  coverage. Patient agreed that a goal of NO low readings with symptoms was a good short-term goal.  We discussed reducing his variability would require an additional mealtime administration. Patient is able to verbalize appropriate hypoglycemia management plan. Medication adherence appears good. - Reduced Basaglar  (insulin  glargine) from 17 to 14 units once daily.   - Initiated rapid insulin  Fiasp  (insulin  aspart) at 3 units prior to evening meal (single largest meal of the day).  -Continued SGLT2-I Jardiance  (empagliflozin ) 25 mg with most recent eGFR of 22  -Patient educated on purpose, proper use, and potential adverse effects.  -Extensively discussed pathophysiology of diabetes, recommended lifestyle interventions, dietary effects on blood sugar control.  -Counseled on s/sx of and management of hypoglycemia.

## 2024-06-17 NOTE — Progress Notes (Signed)
 S:     Chief Complaint  Patient presents with   Medication Management    Diabetes - CGM review and Insulin  therapy   68 y.o. male who presents for diabetes evaluation, education, and management.  Patient arrives in good spirits and presents without any assistance.   Patient was referred and last seen by Primary Care Provider, Dr. Donah, on 06/01/2024.  Patient was last seen in pharmacy clinic on 11/03/2023   PMH is significant for T2DM, HTN, HLD, CKD (SCr ~ 3)  At last visit, Insulin  glargine was adjusted.  Patient reports he has been recently using 17 units once daily.    Current diabetes medications include: empagliflozin  25mg  daily and insulin  glargine 17 units once daily.  Current hypertension medications include: amlodipine  5mg  and lisinopril  5mg  daily Current hyperlipidemia medications include: atorvastatin  80mg  daily   Patient reports adherence to taking all medications as prescribed.    Do you feel that your medications are working for you? yes Have you been experiencing any side effects to the medications prescribed? no Do you have any problems obtaining medications due to transportation or finances? no Insurance coverage: aetna  Patient denies visual changes.   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   Physical Exam Vitals reviewed.  Constitutional:      Appearance: Normal appearance.  Pulmonary:     Effort: Pulmonary effort is normal.  Neurological:     Mental Status: He is alert.  Psychiatric:        Mood  and Affect: Mood normal.        Behavior: Behavior normal.        Thought Content: Thought content normal.        Judgment: Judgment normal.    Dexcom CGM Download today 06/17/2024 % Time CGM is active: 99.4% Average Glucose: 152 mg/dL Glucose Management Indicator: 7.0  Glucose Variability: 40.3% (goal <36%) Time in Goal:  - Time in range 70-180: 67% - Time above range: 29% - Time below range: 4%   Lab Results  Component Value Date   HGBA1C 8.3 (A) 06/01/2024   Vitals:   06/17/24 0943  BP: 126/64  Pulse: (!) 57  SpO2: 100%    Lipid Panel     Component Value Date/Time   CHOL 143 12/18/2023 0938   TRIG 56 12/18/2023 0938   HDL 28 (L) 12/18/2023 0938   CHOLHDL 5.1 (H) 12/18/2023 0938   CHOLHDL 4.4 07/22/2016 1201   VLDL 18 07/22/2016 1201   LDLCALC 103 (H) 12/18/2023 0938   LDLDIRECT 107 (H) 04/10/2023 1155    Clinical Atherosclerotic Cardiovascular Disease (ASCVD):  The 10-year ASCVD risk score (Arnett DK, et al., 2019) is: 33.4%   Values used to calculate the score:     Age: 85 years     Clincally relevant sex: Male     Is Non-Hispanic African American: Yes     Diabetic: Yes     Tobacco smoker: No     Systolic Blood Pressure: 126 mmHg     Is BP treated: Yes     HDL  Cholesterol: 28 mg/dL     Total Cholesterol: 143 mg/dL     A/P: Diabetes longstanding currently high variability (40%) on CGM likely due to need for prandial insulin  coverage. Patient agreed that a goal of NO low readings with symptoms was a good short-term goal.  We discussed reducing his variability would require an additional mealtime administration. Patient is able to verbalize appropriate hypoglycemia management plan. Medication adherence appears good. - Reduced Basaglar  (insulin  glargine) from 17 to 14 units once daily.   - Initiated rapid insulin  Fiasp  (insulin  aspart) at 3 units prior to evening meal (single largest meal of the day).  -Continued SGLT2-I Jardiance  (empagliflozin ) 25 mg with  most recent eGFR of 22  -Patient educated on purpose, proper use, and potential adverse effects.  -Extensively discussed pathophysiology of diabetes, recommended lifestyle interventions, dietary effects on blood sugar control.  -Counseled on s/sx of and management of hypoglycemia.  -Next A1c anticipated 1-3 months.    ASCVD risk - primary prevention in patient with diabetes. Last LDL was 107 on previous dose of atorvastatin .  Goal of <70 mg/dL. high intensity statin indicated.  -Continued atorvastatin  80 mg daily - Plan  lipid panel at next lab draw   Written patient instructions provided. Patient verbalized understanding of treatment plan.  Total time in face to face counseling 32 minutes.    Follow-up:  Pharmacist ~ 6 weeks 07/26/24 PCP clinic visit in TBD

## 2024-06-18 NOTE — Progress Notes (Signed)
 Reviewed and agree with Dr Macky Lower plan.

## 2024-07-02 DIAGNOSIS — H52203 Unspecified astigmatism, bilateral: Secondary | ICD-10-CM | POA: Diagnosis not present

## 2024-07-02 DIAGNOSIS — E113393 Type 2 diabetes mellitus with moderate nonproliferative diabetic retinopathy without macular edema, bilateral: Secondary | ICD-10-CM | POA: Diagnosis not present

## 2024-07-02 DIAGNOSIS — H25013 Cortical age-related cataract, bilateral: Secondary | ICD-10-CM | POA: Diagnosis not present

## 2024-07-02 DIAGNOSIS — H11441 Conjunctival cysts, right eye: Secondary | ICD-10-CM | POA: Diagnosis not present

## 2024-07-02 LAB — HM DIABETES EYE EXAM

## 2024-07-21 ENCOUNTER — Ambulatory Visit
Admission: RE | Admit: 2024-07-21 | Discharge: 2024-07-21 | Disposition: A | Discharge status: Home / Self Care | Source: Ambulatory Visit | Attending: Urology | Admitting: Urology

## 2024-07-21 DIAGNOSIS — N281 Cyst of kidney, acquired: Secondary | ICD-10-CM

## 2024-07-21 MED ORDER — GADOPICLENOL 0.5 MMOL/ML IV SOLN: 7.0000 mL | Freq: Once | INTRAVENOUS | Status: DC | PRN

## 2024-07-21 MED ORDER — GADOPICLENOL 0.5 MMOL/ML IV SOLN
7.0000 mL | Freq: Once | INTRAVENOUS | Status: AC | PRN
  Administered 2024-07-21 (×2): 7 mL via INTRAVENOUS

## 2024-07-26 ENCOUNTER — Ambulatory Visit (INDEPENDENT_AMBULATORY_CARE_PROVIDER_SITE_OTHER): Admitting: Pharmacist

## 2024-07-26 ENCOUNTER — Ambulatory Visit: Admitting: Pharmacist

## 2024-07-26 ENCOUNTER — Encounter: Payer: Self-pay | Admitting: Pharmacist

## 2024-07-26 VITALS — BP 134/68 | HR 62 | Wt 155.4 lb

## 2024-07-26 DIAGNOSIS — I10 Essential (primary) hypertension: Secondary | ICD-10-CM

## 2024-07-26 DIAGNOSIS — N2581 Secondary hyperparathyroidism of renal origin: Secondary | ICD-10-CM | POA: Diagnosis not present

## 2024-07-26 DIAGNOSIS — Z794 Long term (current) use of insulin: Secondary | ICD-10-CM | POA: Diagnosis not present

## 2024-07-26 DIAGNOSIS — E1122 Type 2 diabetes mellitus with diabetic chronic kidney disease: Secondary | ICD-10-CM

## 2024-07-26 DIAGNOSIS — E785 Hyperlipidemia, unspecified: Secondary | ICD-10-CM

## 2024-07-26 DIAGNOSIS — I129 Hypertensive chronic kidney disease with stage 1 through stage 4 chronic kidney disease, or unspecified chronic kidney disease: Secondary | ICD-10-CM | POA: Diagnosis not present

## 2024-07-26 DIAGNOSIS — E119 Type 2 diabetes mellitus without complications: Secondary | ICD-10-CM

## 2024-07-26 DIAGNOSIS — N184 Chronic kidney disease, stage 4 (severe): Secondary | ICD-10-CM

## 2024-07-26 MED ORDER — FIASP FLEXTOUCH 100 UNIT/ML ~~LOC~~ SOPN: 4.0000 [IU] | PEN_INJECTOR | Freq: Every day | SUBCUTANEOUS | Status: DC

## 2024-07-26 MED ORDER — BASAGLAR KWIKPEN 100 UNIT/ML ~~LOC~~ SOPN: 12.0000 [IU] | PEN_INJECTOR | Freq: Every day | SUBCUTANEOUS | Status: AC

## 2024-07-26 MED ORDER — EZETIMIBE 10 MG PO TABS: 10.0000 mg | ORAL_TABLET | Freq: Every day | ORAL | 2 refills | Status: DC

## 2024-07-26 NOTE — Progress Notes (Signed)
 S:     Chief Complaint  Patient presents with   Medication Management    DM f/u   68 y.o. male who presents for diabetes evaluation, education, and management. Patient arrives in good spirits and presents without any assistance.   Patient was referred and last seen by Primary Care Provider, Dr. Donah, on 06/01/24.  At last visit with me on 06/17/24, Basaglar  (insulin  glargine) was decreased from 14 units to 12 units and Fiasp  (insulin  aspart) 3 units prior to evening meal was initiated.   PMH is significant for T2DM, HTN, HLD, CKD.   Current diabetes medications include: Jardiance  (empagliflozin ) 25mg  daily and Basaglar  (insulin  glargine) 13 units once daily, Fiasp  (insulin  aspart) 3 units prior to supper Current hypertension medications include: amlodipine  5mg  and lisinopril  5mg  daily Current hyperlipidemia medications include: atorvastatin  80mg  daily  Patient reports adherence to taking all medications as prescribed. Reports home glucose numbers are good.  Do you feel that your medications are working for you? yes Have you been experiencing any side effects to the medications prescribed? no Do you have any problems obtaining medications due to transportation or finances? no Insurance coverage: [employer] Medicare  Patient denies hypoglycemic events.  Patient reported dietary habits: Eats largest meal at lunch time.   O:   Review of Systems  All other systems reviewed and are negative.   Physical Exam Constitutional:      Appearance: Normal appearance.  Pulmonary:     Effort: Pulmonary effort is normal.  Neurological:     Mental Status: He is alert.  Psychiatric:        Mood and Affect: Mood normal.        Behavior: Behavior normal.        Thought Content: Thought content normal.        Judgment: Judgment normal.    7 day average blood glucose: 183 mg/dL  Libre3 CGM Download today 07/26/24 % Time CGM is active: 99.1% Average Glucose: 171 mg/dL Glucose  Management Indicator: 7.4  Glucose Variability: 32.6% (goal <36%) Time in Goal:  - Time in range 70-180: 60% - Time above range: 40% - Time below range: 0%   Lab Results  Component Value Date   HGBA1C 8.3 (A) 06/01/2024   Vitals:   07/26/24 1057 07/26/24 1113  BP: (!) 152/88 134/68  Pulse: 62   SpO2: 100%     Lipid Panel     Component Value Date/Time   CHOL 143 12/18/2023 0938   TRIG 56 12/18/2023 0938   HDL 28 (L) 12/18/2023 0938   CHOLHDL 5.1 (H) 12/18/2023 0938   CHOLHDL 4.4 07/22/2016 1201   VLDL 18 07/22/2016 1201   LDLCALC 103 (H) 12/18/2023 0938   LDLDIRECT 107 (H) 04/10/2023 1155    Clinical Atherosclerotic Cardiovascular Disease (ASCVD): No  The 10-year ASCVD risk score (Arnett DK, et al., 2019) is: 37.7%   Values used to calculate the score:     Age: 59 years     Clincally relevant sex: Male     Is Non-Hispanic African American: Yes     Diabetic: Yes     Tobacco smoker: No     Systolic Blood Pressure: 134 mmHg     Is BP treated: Yes     HDL Cholesterol: 28 mg/dL     Total Cholesterol: 143 mg/dL    A/P: Diabetes longstanding currently under better control since adding prandial insulin  coverage, on CGM today GMI 7.4%. Patient's variability have decreased from 40% to 32.6%  and is no longer experiencing hypoglycemic events. Patient reports largest meal at lunchtime and LibreView Patient is able to verbalize appropriate hypoglycemia management plan. Medication adherence appears good. -DECREASE Basaglar  (insulin  glargine) from 13 units daily to 12 units daily.  -CHANGE Fiasp  (insulin  aspart) from 3 units with supper to 4 units with lunch. Patient advised to bring insulin  to work in cooler. -Continue all other medication the same.  -Patient educated on purpose, proper use, and potential adverse effects.  -Extensively discussed pathophysiology of diabetes, recommended lifestyle interventions, dietary effects on blood sugar control.  -Counseled on s/sx of and  management of hypoglycemia.   ASCVD risk - primary prevention in patient with diabetes on atorvastatin  80 mg and tolerating well. Last LDL 103 mg/dL on 8/0/74 not at goal of <70 mg/dL. ASCVD risk factors include T2DM, Hyperlipidemia, hypertension, 10-pack-year smoking history (quit over 40 years ago) and 10-year ASCVD risk score of 37.7%. Patient on max dose high intensity statin, addition of nonstatin warranted for further lipid lowering to goal. -START ezetimibe  10 mg once daily for your cholesterol. -Patient educated on purpose, proper use, and potential adverse effects (rare statin-like myalgias).  -Repeat lipid panel at next PCP visit with Dr. Donah on 09/06/24.  Hypertension longstanding has been controlled on lisinopril  5 mg and amlodipine  5 mg, pressure today 152/88 mmHg and repeat 134/80 mmHg measured on wrists d/t blood drawn on right arm and Libre sensor on right arm. Blood pressure goal of <130/80 mmHg. Medication adherence good. Blood pressure control is slightly above goal on repeat reading, but does not warrant medication change today. -Continue lisinopril  5 mg daily and amlodipine  5 mg daily. -Monitor blood pressure at home and future visits to reevaluate if medication change is necessary in future.  Written patient instructions provided. Patient verbalized understanding of treatment plan.  Total time in face to face counseling 21 minutes.    Follow-up:  Pharmacist PRN PCP clinic visit on 09/06/24 Patient seen with Fonda Blase, PharmD Candidate - PY3 student and Calton Nash, PharmD Candidate - PY4 student.

## 2024-07-26 NOTE — Patient Instructions (Addendum)
 It was nice to see you today! Keep up the great work, your glucose is much better controlled.  Your goal blood sugar is 80-130 before eating and less than 180 after eating.  Medication Changes: -START ezetimibe  10 mg once daily for your cholesterol. -DECREASE Basaglar  (insulin  glargine) from 13 units daily to 12 units daily.  -CHANGE Fiasp  (insulin  aspart) from 3 units with supper to 4 units with lunch. -Continue all other medication the same.   Keep up the good work with diet and exercise. Aim for a diet full of vegetables, fruit and lean meats (chicken, [place], fish). Try to limit salt intake by eating fresh or frozen vegetables (instead of canned), rinse canned vegetables prior to cooking and do not add any additional salt to meals.

## 2024-07-26 NOTE — Assessment & Plan Note (Signed)
 ASCVD risk - primary prevention in patient with diabetes on atorvastatin  80 mg and tolerating well. Last LDL 103 mg/dL on 8/0/74 not at goal of <70 mg/dL. ASCVD risk factors include T2DM, Hyperlipidemia, hypertension, 10-pack-year smoking history (quit over 40 years ago) and 10-year ASCVD risk score of 37.7%. Patient on max dose high intensity statin, addition of nonstatin warranted for further lipid lowering to goal. -START ezetimibe  10 mg once daily for your cholesterol. -Patient educated on purpose, proper use, and potential adverse effects (rare statin-like myalgias).  -Repeat lipid panel at next PCP visit with Dr. Donah on 09/06/24.

## 2024-07-26 NOTE — Assessment & Plan Note (Signed)
 Diabetes longstanding currently under better control since adding prandial insulin  coverage, on CGM today GMI 7.4%. Patient's variability have decreased from 40% to 32.6% and is no longer experiencing hypoglycemic events. Patient reports largest meal at lunchtime and LibreView Patient is able to verbalize appropriate hypoglycemia management plan. Medication adherence appears good. -DECREASE Basaglar  (insulin  glargine) from 13 units daily to 12 units daily.  -CHANGE Fiasp  (insulin  aspart) from 3 units with supper to 4 units with lunch. Patient advised to bring insulin  to work in cooler. -Continue all other medication the same.  -Patient educated on purpose, proper use, and potential adverse effects.  -Extensively discussed pathophysiology of diabetes, recommended lifestyle interventions, dietary effects on blood sugar control.  -Counseled on s/sx of and management of hypoglycemia.

## 2024-07-26 NOTE — Progress Notes (Signed)
 Reviewed and agree with Dr Rennis plan.

## 2024-07-26 NOTE — Assessment & Plan Note (Signed)
 Hypertension longstanding has been controlled on lisinopril  5 mg and amlodipine  5 mg, pressure today 152/88 mmHg and repeat 134/80 mmHg measured on wrists d/t blood drawn on right arm and Libre sensor on right arm. Blood pressure goal of <130/80 mmHg. Medication adherence good. Blood pressure control is slightly above goal on repeat reading, but does not warrant medication change today. -Continue lisinopril  5 mg daily and amlodipine  5 mg daily. -Monitor blood pressure at home and future visits to reevaluate if medication change is necessary in future.

## 2024-07-30 ENCOUNTER — Other Ambulatory Visit: Payer: Self-pay | Admitting: Family Medicine

## 2024-07-30 DIAGNOSIS — E785 Hyperlipidemia, unspecified: Secondary | ICD-10-CM

## 2024-08-11 DIAGNOSIS — Z125 Encounter for screening for malignant neoplasm of prostate: Secondary | ICD-10-CM | POA: Diagnosis not present

## 2024-08-11 DIAGNOSIS — N281 Cyst of kidney, acquired: Secondary | ICD-10-CM | POA: Diagnosis not present

## 2024-08-11 DIAGNOSIS — N401 Enlarged prostate with lower urinary tract symptoms: Secondary | ICD-10-CM | POA: Diagnosis not present

## 2024-08-11 DIAGNOSIS — R350 Frequency of micturition: Secondary | ICD-10-CM | POA: Diagnosis not present

## 2024-09-06 ENCOUNTER — Ambulatory Visit (INDEPENDENT_AMBULATORY_CARE_PROVIDER_SITE_OTHER): Admitting: Family Medicine

## 2024-09-06 ENCOUNTER — Encounter: Payer: Self-pay | Admitting: Family Medicine

## 2024-09-06 VITALS — BP 129/79 | HR 73 | Ht 70.0 in | Wt 154.4 lb

## 2024-09-06 DIAGNOSIS — E1122 Type 2 diabetes mellitus with diabetic chronic kidney disease: Secondary | ICD-10-CM | POA: Diagnosis not present

## 2024-09-06 DIAGNOSIS — N184 Chronic kidney disease, stage 4 (severe): Secondary | ICD-10-CM

## 2024-09-06 DIAGNOSIS — Z0000 Encounter for general adult medical examination without abnormal findings: Secondary | ICD-10-CM | POA: Diagnosis not present

## 2024-09-06 DIAGNOSIS — Z23 Encounter for immunization: Secondary | ICD-10-CM

## 2024-09-06 DIAGNOSIS — Z794 Long term (current) use of insulin: Secondary | ICD-10-CM

## 2024-09-06 LAB — POCT GLYCOSYLATED HEMOGLOBIN (HGB A1C): HbA1c, POC (controlled diabetic range): 6.4 % (ref 0.0–7.0)

## 2024-09-06 MED ORDER — FIASP FLEXTOUCH 100 UNIT/ML ~~LOC~~ SOPN: 2.0000 [IU] | PEN_INJECTOR | Freq: Two times a day (BID) | SUBCUTANEOUS | Status: AC

## 2024-09-06 NOTE — Progress Notes (Unsigned)
  Date of Visit: 09/06/2024   SUBJECTIVE:   HPI:  Discussed the use of AI scribe software for clinical note transcription with the patient, who gave verbal consent to proceed.  History of Present Illness Richard Gill. is a 68 year old male with diabetes who presents for a follow-up visit.  Glycemic control and hypoglycemia - Diabetes managed with multiple medications including Basaglar  12 units daily and short-acting insulin  2 units in the morning and 2 units in the evening, Jardiance  25 mg daily, and other agents. - Episodes of hypoglycemia with blood glucose dropping into the fifties, particularly when busy or sleeping. - Occasional hypoglycemic symptoms, managed by consuming pomegranate juice. - Episodes are infrequent and insulin  doses have been adjusted to manage hypoglycemia.  Medication management - Current medications include: amlodipine  5 mg daily, aspirin  81 mg daily, Lipitor 80 mg daily, CoQ10 every other day, Jardiance  25 mg daily, Zetia  10 mg daily, ginger every other day, hydroxyzine  as needed, Basaglar  12 units daily, short-acting insulin  2 units in the morning and 2 units in the evening, lisinopril  5 mg daily, multivitamin, turmeric with ginger every other day. - No reported side effects or concerns with current medication regimen.  Renal health and procedures - Recently received a call regarding kidney care and evaluation for a procedure. - Did not meet criteria for the procedure, which was unexpected.  Immunization status - Received both doses of the shingles vaccine. - Records show only one dose, but he recalls receiving the second dose at [facility] on [address] at the end of the recommended interval.  General health status - Feels well overall with no specific concerns.      OBJECTIVE:   BP 129/79   Pulse 73   Ht 5' 10 (1.778 m)   Wt 154 lb 6.4 oz (70 kg)   SpO2 99%   BMI 22.15 kg/m  Gen: *** HEENT: *** Heart: *** Lungs: *** Neuro: *** Ext:  ***  ASSESSMENT/PLAN:   Assessment & Plan Type 2 diabetes mellitus with stage 4 chronic kidney disease, with long-term current use of insulin  (HCC)      Assessment and Plan Assessment & Plan Type 2 diabetes mellitus with hypoglycemia and diabetic chronic kidney disease Type 2 diabetes with hypoglycemia episodes, especially during activities or sleep. Diabetic chronic kidney disease. Not eligible for kidney transplant due to unspecified criteria. Blood glucose management adjusted to prevent hypoglycemia. - Monitor blood glucose levels with continuous glucose monitor. - Adjust short-acting insulin  to 2 units in the morning and 2 units in the evening. - Follow up with nephrology regarding kidney transplant eligibility. - Review A1c results when available.  Hypertension Hypertension managed with amlodipine  and lisinopril .  Hyperlipidemia Hyperlipidemia managed with Lipitor and Zetia .  General Health Maintenance Discussed vaccinations including flu and COVID-19 vaccines. Received both doses of shingles vaccine, records may need verification. - Administer flu vaccine. - Administer COVID-19 vaccine. - Verify shingles vaccine records with [facility] on [address].  Follow-Up Follow-up plans discussed for general health maintenance and diabetes management. - Return for follow-up in approximately 8 weeks. - Follow up with nephrology regarding kidney transplant eligibility.      FOLLOW UP: Follow up in *** for ***  [place] J. Donah, MD [facility]

## 2024-09-06 NOTE — Patient Instructions (Addendum)
 It was great to see you again today.  Flu and COVID vaccines today  A1c looks great. Let me know if you continue to have low sugars  Be well, Dr. Donah

## 2024-09-07 ENCOUNTER — Ambulatory Visit: Payer: Self-pay | Admitting: Family Medicine

## 2024-09-08 ENCOUNTER — Other Ambulatory Visit: Payer: Self-pay | Admitting: *Deleted

## 2024-09-08 NOTE — Assessment & Plan Note (Signed)
 Flu vaccine today COVID vaccine today Will request records re: shingrix  vaccine from [facility]

## 2024-09-08 NOTE — Assessment & Plan Note (Signed)
 A1c well controlled at 6.4 - continue current insulin  regimen and monitoring with CGM, alert us  if has ongoing hypoglycemia, episodes currently sound rare - Follow up with nephrology regarding kidney transplant eligibility.

## 2024-09-10 ENCOUNTER — Other Ambulatory Visit: Payer: Self-pay

## 2024-09-10 MED ORDER — DEXCOM G7 SENSOR MISC: 3 refills | Status: DC

## 2024-09-16 DIAGNOSIS — I129 Hypertensive chronic kidney disease with stage 1 through stage 4 chronic kidney disease, or unspecified chronic kidney disease: Secondary | ICD-10-CM | POA: Diagnosis not present

## 2024-09-16 DIAGNOSIS — N184 Chronic kidney disease, stage 4 (severe): Secondary | ICD-10-CM | POA: Diagnosis not present

## 2024-09-16 DIAGNOSIS — E1122 Type 2 diabetes mellitus with diabetic chronic kidney disease: Secondary | ICD-10-CM | POA: Diagnosis not present

## 2024-09-16 DIAGNOSIS — D631 Anemia in chronic kidney disease: Secondary | ICD-10-CM | POA: Diagnosis not present

## 2024-09-16 DIAGNOSIS — N2581 Secondary hyperparathyroidism of renal origin: Secondary | ICD-10-CM | POA: Diagnosis not present

## 2024-10-25 ENCOUNTER — Other Ambulatory Visit: Payer: Self-pay | Admitting: Family Medicine

## 2024-10-28 ENCOUNTER — Other Ambulatory Visit: Payer: Self-pay | Admitting: Family Medicine

## 2024-11-01 DIAGNOSIS — D472 Monoclonal gammopathy: Secondary | ICD-10-CM | POA: Diagnosis not present

## 2024-11-01 DIAGNOSIS — R319 Hematuria, unspecified: Secondary | ICD-10-CM | POA: Diagnosis not present

## 2024-11-01 DIAGNOSIS — N401 Enlarged prostate with lower urinary tract symptoms: Secondary | ICD-10-CM | POA: Diagnosis not present

## 2024-11-01 DIAGNOSIS — R918 Other nonspecific abnormal finding of lung field: Secondary | ICD-10-CM | POA: Diagnosis not present

## 2024-11-01 DIAGNOSIS — N184 Chronic kidney disease, stage 4 (severe): Secondary | ICD-10-CM | POA: Diagnosis not present

## 2024-11-05 DIAGNOSIS — N184 Chronic kidney disease, stage 4 (severe): Secondary | ICD-10-CM | POA: Diagnosis not present

## 2024-11-08 ENCOUNTER — Telehealth: Payer: Self-pay

## 2024-11-08 NOTE — Telephone Encounter (Signed)
 Received message from patient's wife requesting returned call to schedule appointment. In VM, wife indicated that patient needs to be seen for concerns with vomiting up blood. Patient does not want to be seen in the emergency department.   Returned call to wife at provided number. She did not answer, LVM requesting returned call.   Wife returns call to nurse line. She reports that on Thanksgiving patient had an episode where he coughed up blood. During episode, patient complained of right sided upper abdominal pain.  Wife reports that amount could cover about the bottom of a measuring cup. Patient was adamant that he did not/does not want to go the emergency department. Wife reports coughing up blood clot a few days later.  Since then, patient has not had anymore episodes of hematemesis. Patient reports that this has happened before, however, did not receive evaluation.  She denies recent illness, chest pain, difficulty breathing, lightheadedness or dizziness.   Scheduled patient for evaluation tomorrow morning. Advised of strict ED precautions. Wife voices understanding.     Chiquita JAYSON English, RN

## 2024-11-08 NOTE — Telephone Encounter (Signed)
 Agree with strict precautions and seeing us  ASAP if he is refusing ED evaluation. Richard JINNY Legions, MD

## 2024-11-09 ENCOUNTER — Encounter: Payer: Self-pay | Admitting: Family Medicine

## 2024-11-09 ENCOUNTER — Ambulatory Visit: Admitting: Family Medicine

## 2024-11-09 ENCOUNTER — Ambulatory Visit

## 2024-11-09 ENCOUNTER — Other Ambulatory Visit

## 2024-11-09 ENCOUNTER — Ambulatory Visit
Admission: RE | Admit: 2024-11-09 | Discharge: 2024-11-09 | Disposition: A | Source: Ambulatory Visit | Attending: Family Medicine

## 2024-11-09 VITALS — BP 138/78 | HR 66 | Ht 70.0 in | Wt 153.4 lb

## 2024-11-09 DIAGNOSIS — R042 Hemoptysis: Secondary | ICD-10-CM | POA: Diagnosis not present

## 2024-11-09 NOTE — Progress Notes (Deleted)
    SUBJECTIVE:   CHIEF COMPLAINT / HPI: hemoptysis   Per phone note 11/08/24: Wife returns call to nurse line. She reports that on Thanksgiving patient had an episode where he coughed up blood. During episode, patient complained of right sided upper abdominal pain. Wife reports that amount could cover about the bottom of a measuring cup. Patient was adamant that he did not/does not want to go the emergency department. Wife reports coughing up blood clot a few days later. Since then, patient has not had anymore episodes of hematemesis. Patient reports that this has happened before, however, did not receive evaluation.    Discussed the use of AI scribe software for clinical note transcription with the patient, who gave verbal consent to proceed.  History of Present Illness      PERTINENT  PMH / PSH: Smoldering multiple myeloma, T2DM, HTN, BPPV, nephrolithiasis, CKD 4.  On aspirin  but otherwise no blood thinners.  Does have a history of right lower lobe lung nodule on CT but follow-up scans of this favored benign etiology, most recently in 2019.  OBJECTIVE:   There were no vitals taken for this visit.   Physical Exam   ***  ASSESSMENT/PLAN:   Assessment and Plan Assessment & Plan     ***  Assessment & Plan    Payton Coward, MD [facility]

## 2024-11-09 NOTE — Progress Notes (Signed)
    SUBJECTIVE:   CHIEF COMPLAINT / HPI: hemoptysis   Per phone note 11/08/24: Wife returns call to nurse line. She reports that on Thanksgiving patient had an episode where he coughed up blood. During episode, patient complained of right sided upper abdominal pain. Wife reports that amount could cover about the bottom of a measuring cup. Patient was adamant that he did not/does not want to go the emergency department. Wife reports coughing up blood clot a few days later. Since then, patient has not had anymore episodes of hematemesis. Patient reports that this has happened before, however, did not receive evaluation.   - First episode of coughing up blood on Thanksgiving after having the sensation that something was in his throat - No recurrent episodes since - In the past reports having small mucus-like blood clots that he spit up over the course of 2-3 months - No globus sensation at this time - Denies any hx of bloody nose or bloody stools; denies nausea/vomiting - Sibling with blood clot in her legs, but no known history of hemophilias/clotting disorders - Denies any mucosal lesions in the mouth - Denies any acid reflux symptoms  PERTINENT  PMH / PSH: Smoldering multiple myeloma, T2DM, HTN, BPPV, nephrolithiasis, CKD 4.  On aspirin  but otherwise no blood thinners.  Does have a history of right lower lobe lung nodule on CT but follow-up scans of this favored benign etiology, most recently in 2019.  OBJECTIVE:   BP 138/78   Pulse 66   Ht 5' 10 (1.778 m)   Wt 153 lb 6.4 oz (69.6 kg)   SpO2 100%   BMI 22.01 kg/m    General: Awake and Alert in NAD HEENT: NCAT. Sclera anicteric. No rhinorrhea. Cardiovascular: RRR. No M/R/G Respiratory: CTAB, normal WOB on RA. No wheezing, crackles, rhonchi, or diminished breath sounds. Abdomen: Soft, non-tender, non-distended. Bowel sounds normoactive Extremities: Able to move all extremities. No BLE edema, no deformities or significant joint  findings. Skin: Warm and dry. No abrasions or rashes noted. Neuro: A&Ox3. No focal neurological deficits.  ASSESSMENT/PLAN:   Assessment & Plan Hemoptysis Sporadic episodes of hemoptysis over the last couple years.  Endorses a globus sensation in the throat which triggers him to cough which then sometimes produces a blood clot.  Differential is broad including airway diseases, infections, inflammatory diseases with history of smoldering multiple myeloma, clotting disorders, or foreign body. - Labs: CBC, CMP, PT-INR - Imaging: Chest x-ray; if negative will get a CT chest without contrast due to poor kidney function to further evaluate - May need to consider EGD vs bronchoscopy if symptoms persist or worsen  [name], DO [facility]

## 2024-11-09 NOTE — Patient Instructions (Signed)
 It was great to see you today! Thank you for choosing [facility] for your primary care. Richard Gill. was seen for hemoptysis.  Today we addressed: Hemoptysis (coughing up blood) - we will check some labs on you and get a XR of your chest. IF this is negative we will do a CT of your chest without contrast to evaluate the symptoms further with your history Please let us  know if there are recurrently episodes  We are checking some labs today. I will send you a MyChart message with your results, per your preference. If you do not hear about your labs in the next 2 weeks, please call the office.   You should return to our clinic No follow-ups on file. Please arrive 15 minutes before your appointment to ensure smooth check in process.  We appreciate your efforts in making this happen.  Thank you for allowing me to participate in your care, [name], DO 11/09/2024, 11:43 AM PGY-2, [facility]

## 2024-11-10 ENCOUNTER — Ambulatory Visit: Payer: Self-pay | Admitting: Family Medicine

## 2024-11-10 LAB — CBC WITH DIFFERENTIAL/PLATELET
Basophils Absolute: 0 x10E3/uL (ref 0.0–0.2)
Basos: 1 %
EOS (ABSOLUTE): 0.2 x10E3/uL (ref 0.0–0.4)
Eos: 5 %
Hematocrit: 33.2 % — ABNORMAL LOW (ref 37.5–51.0)
Hemoglobin: 10.7 g/dL — ABNORMAL LOW (ref 13.0–17.7)
Immature Grans (Abs): 0 x10E3/uL (ref 0.0–0.1)
Immature Granulocytes: 0 %
Lymphocytes Absolute: 1 x10E3/uL (ref 0.7–3.1)
Lymphs: 24 %
MCH: 30.1 pg (ref 26.6–33.0)
MCHC: 32.2 g/dL (ref 31.5–35.7)
MCV: 94 fL (ref 79–97)
Monocytes Absolute: 0.3 x10E3/uL (ref 0.1–0.9)
Monocytes: 6 %
Neutrophils Absolute: 2.7 x10E3/uL (ref 1.4–7.0)
Neutrophils: 64 %
Platelets: 155 x10E3/uL (ref 150–450)
RBC: 3.55 x10E6/uL — ABNORMAL LOW (ref 4.14–5.80)
RDW: 14.7 % (ref 11.6–15.4)
WBC: 4.2 x10E3/uL (ref 3.4–10.8)

## 2024-11-10 LAB — COMPREHENSIVE METABOLIC PANEL WITH GFR
ALT: 34 IU/L (ref 0–44)
AST: 33 IU/L (ref 0–40)
Albumin: 4.6 g/dL (ref 3.9–4.9)
Alkaline Phosphatase: 203 IU/L — ABNORMAL HIGH (ref 47–123)
BUN/Creatinine Ratio: 21 (ref 10–24)
BUN: 69 mg/dL — ABNORMAL HIGH (ref 8–27)
Bilirubin Total: 0.7 mg/dL (ref 0.0–1.2)
CO2: 18 mmol/L — ABNORMAL LOW (ref 20–29)
Calcium: 8.5 mg/dL — ABNORMAL LOW (ref 8.6–10.2)
Chloride: 110 mmol/L — ABNORMAL HIGH (ref 96–106)
Creatinine, Ser: 3.24 mg/dL — ABNORMAL HIGH (ref 0.76–1.27)
Globulin, Total: 3 g/dL (ref 1.5–4.5)
Glucose: 81 mg/dL (ref 70–99)
Potassium: 4.1 mmol/L (ref 3.5–5.2)
Sodium: 143 mmol/L (ref 134–144)
Total Protein: 7.6 g/dL (ref 6.0–8.5)
eGFR: 20 mL/min/1.73 — ABNORMAL LOW (ref >59)

## 2024-11-10 LAB — PROTIME-INR
INR: 1 (ref 0.9–1.2)
Prothrombin Time: 10.9 s (ref 9.1–12.0)

## 2024-11-11 DIAGNOSIS — I129 Hypertensive chronic kidney disease with stage 1 through stage 4 chronic kidney disease, or unspecified chronic kidney disease: Secondary | ICD-10-CM | POA: Diagnosis not present

## 2024-11-11 DIAGNOSIS — D631 Anemia in chronic kidney disease: Secondary | ICD-10-CM | POA: Diagnosis not present

## 2024-11-11 DIAGNOSIS — N184 Chronic kidney disease, stage 4 (severe): Secondary | ICD-10-CM | POA: Diagnosis not present

## 2024-11-11 DIAGNOSIS — N2581 Secondary hyperparathyroidism of renal origin: Secondary | ICD-10-CM | POA: Diagnosis not present

## 2024-11-16 NOTE — Telephone Encounter (Signed)
 Attempted to call patient to discuss labs and x-ray findings.  Left a HIPAA compliant message.  Advised patient to set up a follow-up appointment with PCP soon in order to discuss if hemoptysis is reoccurring and the utility of a CT chest to investigate this further with his history of smoking in the past.  This was discussed with Dr. Anders (preceptor for today) and was her recommendation.  Chest x-ray revealed linear scarring/atelectasis in the right perihilar area in both lower lobes.  No evidence of acute chest disease.  Stable anemia noted on CBC.  Kidney function appears to be slightly elevated, advised patient to continue following with his kidney doctor scheduled.  PT/INR were within normal limits.  [name], DO 11/16/24 3:47 PM

## 2024-11-24 ENCOUNTER — Other Ambulatory Visit: Payer: Self-pay | Admitting: Family Medicine

## 2024-11-27 ENCOUNTER — Other Ambulatory Visit: Payer: Self-pay

## 2024-11-29 ENCOUNTER — Other Ambulatory Visit (HOSPITAL_COMMUNITY): Payer: Self-pay

## 2024-11-29 ENCOUNTER — Telehealth: Payer: Self-pay

## 2024-11-29 NOTE — Telephone Encounter (Signed)
 Pharmacy Patient Advocate Encounter   Received notification from Patient Pharmacy that prior authorization for HYDROXYZINE  25MG  TABS is required/requested.   Insurance verification completed.   The patient is insured through [facility] MEDICARE.   Per test claim: PA required; PA submitted to above mentioned insurance via Latent Key/confirmation #/[employer]. Status is pending

## 2024-11-29 NOTE — Telephone Encounter (Signed)
 Pharmacy Patient Advocate Encounter  Received notification from [employer] that Prior Authorization HYDROXYZINE  25MG  TABS has been APPROVED from 12/10/23 to 12/08/24   PA #/Case ID/Reference #: [id number]

## 2024-12-04 ENCOUNTER — Other Ambulatory Visit: Payer: Self-pay

## 2024-12-24 ENCOUNTER — Ambulatory Visit

## 2024-12-24 VITALS — BP 148/76 | HR 58 | Wt 152.0 lb

## 2024-12-24 DIAGNOSIS — N184 Chronic kidney disease, stage 4 (severe): Secondary | ICD-10-CM | POA: Diagnosis not present

## 2024-12-24 DIAGNOSIS — Z794 Long term (current) use of insulin: Secondary | ICD-10-CM

## 2024-12-24 DIAGNOSIS — R10A3 Flank pain, bilateral: Secondary | ICD-10-CM

## 2024-12-24 DIAGNOSIS — E1122 Type 2 diabetes mellitus with diabetic chronic kidney disease: Secondary | ICD-10-CM | POA: Diagnosis not present

## 2024-12-24 MED ORDER — TIZANIDINE HCL 4 MG PO TABS: 4.0000 mg | ORAL_TABLET | Freq: Four times a day (QID) | ORAL | 0 refills | Status: AC | PRN

## 2024-12-24 MED ORDER — LIDOCAINE 5 % EX PTCH: 2.0000 | MEDICATED_PATCH | CUTANEOUS | 0 refills | Status: AC

## 2024-12-24 NOTE — Progress Notes (Signed)
" ° ° °  SUBJECTIVE:   CHIEF COMPLAINT / HPI:   Back pain: Comes and goes for the last 4-5 months, But got significantly worse yesterday. No traumatic injuries or falls. No recent exercises or activities unusual from his usual. Worse when sneezing, bending over, lifting, or twists. Pain feels like it's wrapped around my ribs and feels like they are ripping apart.  Denies HA, changes in vision, weight loss, changes with urination, dysuria, hematuria, abdominal pain. No changes with sleep position or mattresses. Baseline 4/10, 10/10 with exertion. Afraid to take any pain meds because of his kidneys so has not used any medication.   Type 2 diabetes: Reports that he has been feeling well, no symptoms of low blood sugar, compliant with medications.  Has loosened his diet recently because it was causing him constipation.  PERTINENT  PMH / PSH: Bilateral renal cysts, CKD stage IV, hypertension, lower back pain  OBJECTIVE:   BP (!) 148/76   Pulse (!) 58   Wt 152 lb (68.9 kg)   SpO2 100%   BMI 21.81 kg/m    Cardiac: Regular rate and rhythm. Normal S1/S2. No murmurs, rubs, or gallops appreciated. Lungs: Clear bilaterally to ascultation.  Chest symmetric with no tenderness to palpation or bony abnormalities. Abdomen: Normoactive bowel sounds.  Mild tenderness to deep of LUQ, no tenderness to light palpation. No rebound or guarding.  No CVA tenderness. Psych: Pleasant and appropriate    ASSESSMENT/PLAN:   Assessment & Plan Bilateral flank pain Chronic pain with acute onset, no traumatic or exacerbating injury.  Primarily suspect 2/2 musculoskeletal strain or injury. With history of bilateral renal cysts monitored with serial abdominal MRIs determined to be benign and unchanging in size less than 3 cm unlikely to be contributing to patient's pain.  No other urinary symptoms or CVA tenderness so unlikely to be due to infectious etiology but will check urine.  Given age and previous smoking history,  osteoporosis with occult fracture possible so will get thoracic x-rays, no tenderness to palpation so less likely. UA not received, patient left prior to leaving urine sample Pending thoracic x-ray Tylenol  1000 mg every 6 hours as needed, topical lidocaine  patch to affected areas every 12 hours as needed, tizanidine  4 mg every 6 hours as needed. Referral to PT placed. Type 2 diabetes mellitus with stage 4 chronic kidney disease, with long-term current use of insulin  (HCC) A1c 6.4 three months ago, overall maintains good control especially for age.  Feels stable on current regimen and will plan for repeat A1c at next visit. Pending urine microalbumin not received, patient left prior to completing urine sample     Fairy Amy, MD [facility] "

## 2024-12-24 NOTE — Patient Instructions (Addendum)
 Thank you for visiting the clinic today, it was good to see you!  Please always bring your medication bottles  In today's visit we discussed:  Back pain: Please much activity as you can tolerate.  First-line medication will be the Tylenol , take two 500 mg tablets every 6 hours as needed, this medication does not interact with your kidneys and is safe with chronic kidney disease.  If the pain still persists I recommend applying the lidocaine  patches to the affected areas, you can wear 2 lidocaine  patches every 12 hours as needed.  If you are still having persistent pain, I have sent a prescription in for tizanidine , this is a muscle and also improve your pain.  I have also sent in a referral for physical therapy to evaluate and work with you.  Lastly I have put in a referral for you to get an x-ray of your back to ensure that there are no rib or vertebral fractures causing the pain.  I will contact you via MyChart regarding urine studies and results to ensure there is no infectious cause of your pain.  Please follow-up in 2 weeks  For any questions, please call the office at [phone] or send me a message in MyChart. Have a great day!  -Fairy Amy, MD  [facility] Resident, PGY-1

## 2024-12-24 NOTE — Assessment & Plan Note (Addendum)
 A1c 6.4 three months ago, overall maintains good control especially for age.  Feels stable on current regimen and will plan for repeat A1c at next visit. Pending urine microalbumin not received, patient left prior to completing urine sample

## 2024-12-25 LAB — MICROALBUMIN / CREATININE URINE RATIO
Creatinine, Urine: 48.1 mg/dL
Microalb/Creat Ratio: 192 mg/g{creat} — ABNORMAL HIGH (ref 0–29)
Microalbumin, Urine: 92.3 ug/mL

## 2024-12-27 ENCOUNTER — Ambulatory Visit: Payer: Self-pay

## 2024-12-31 ENCOUNTER — Other Ambulatory Visit: Payer: Self-pay | Admitting: Family Medicine

## 2025-06-09 ENCOUNTER — Encounter
